# Patient Record
Sex: Male | Born: 1940 | Race: White | Hispanic: No | Marital: Married | Smoking: Never smoker
Health system: Southern US, Community
[De-identification: ages and names within clinical notes are randomized; demographics above are authoritative.]

## PROBLEM LIST (undated history)

## (undated) DIAGNOSIS — I495 Sick sinus syndrome: Secondary | ICD-10-CM

## (undated) DIAGNOSIS — I351 Nonrheumatic aortic (valve) insufficiency: Secondary | ICD-10-CM

## (undated) DIAGNOSIS — I5189 Other ill-defined heart diseases: Secondary | ICD-10-CM

## (undated) DIAGNOSIS — F419 Anxiety disorder, unspecified: Secondary | ICD-10-CM

## (undated) DIAGNOSIS — M25519 Pain in unspecified shoulder: Secondary | ICD-10-CM

## (undated) DIAGNOSIS — M79605 Pain in left leg: Secondary | ICD-10-CM

## (undated) DIAGNOSIS — T4145XA Adverse effect of unspecified anesthetic, initial encounter: Secondary | ICD-10-CM

## (undated) DIAGNOSIS — D509 Iron deficiency anemia, unspecified: Secondary | ICD-10-CM

## (undated) DIAGNOSIS — F0281 Dementia in other diseases classified elsewhere with behavioral disturbance: Secondary | ICD-10-CM

## (undated) DIAGNOSIS — F329 Major depressive disorder, single episode, unspecified: Secondary | ICD-10-CM

## (undated) DIAGNOSIS — F32A Depression, unspecified: Secondary | ICD-10-CM

## (undated) DIAGNOSIS — F02818 Dementia in other diseases classified elsewhere, unspecified severity, with other behavioral disturbance: Secondary | ICD-10-CM

## (undated) DIAGNOSIS — T8859XA Other complications of anesthesia, initial encounter: Secondary | ICD-10-CM

## (undated) DIAGNOSIS — I309 Acute pericarditis, unspecified: Secondary | ICD-10-CM

## (undated) DIAGNOSIS — Z950 Presence of cardiac pacemaker: Secondary | ICD-10-CM

## (undated) DIAGNOSIS — I10 Essential (primary) hypertension: Secondary | ICD-10-CM

## (undated) DIAGNOSIS — G3183 Dementia with Lewy bodies: Secondary | ICD-10-CM

## (undated) HISTORY — DX: Anxiety disorder, unspecified: F41.9

## (undated) HISTORY — DX: Neurocognitive disorder with Lewy bodies: G31.83

## (undated) HISTORY — DX: Major depressive disorder, single episode, unspecified: F32.9

## (undated) HISTORY — PX: OTHER SURGICAL HISTORY: SHX169

## (undated) HISTORY — DX: Nonrheumatic aortic (valve) insufficiency: I35.1

## (undated) HISTORY — PX: NM MYOVIEW LTD: HXRAD82

## (undated) HISTORY — DX: Pain in left leg: M79.605

## (undated) HISTORY — DX: Depression, unspecified: F32.A

## (undated) HISTORY — PX: US ECHOCARDIOGRAPHY: HXRAD669

## (undated) HISTORY — DX: Dementia in other diseases classified elsewhere with behavioral disturbance: F02.81

## (undated) HISTORY — DX: Iron deficiency anemia, unspecified: D50.9

## (undated) HISTORY — DX: Dementia in other diseases classified elsewhere, unspecified severity, with other behavioral disturbance: F02.818

## (undated) HISTORY — DX: Pain in unspecified shoulder: M25.519

## (undated) HISTORY — DX: Other ill-defined heart diseases: I51.89

## (undated) HISTORY — PX: HERNIA REPAIR: SHX51

## (undated) HISTORY — PX: EYE SURGERY: SHX253

---

## 2004-04-10 ENCOUNTER — Ambulatory Visit (HOSPITAL_COMMUNITY): Admission: RE | Admit: 2004-04-10 | Discharge: 2004-04-10 | Payer: Self-pay | Admitting: *Deleted

## 2009-01-06 ENCOUNTER — Ambulatory Visit (HOSPITAL_COMMUNITY): Admission: RE | Admit: 2009-01-06 | Discharge: 2009-01-06 | Payer: Self-pay | Admitting: *Deleted

## 2011-02-12 NOTE — Op Note (Signed)
Charles [name], Charles [name]              ACCOUNT NO.:  [id number]   MEDICAL RECORD NO.:  [id number]          PATIENT TYPE:  AMB   LOCATION:  ENDO                         FACILITY:  [facility]   PHYSICIAN:  Georgiana Spinner, M.D.    DATE OF BIRTH:  [birth year]   DATE OF PROCEDURE:  01/06/2009  DATE OF DISCHARGE:                               OPERATIVE REPORT   PROCEDURE:  Colonoscopy.   INDICATIONS:  Colon cancer surveillance.  He has a brother who died of  colon cancer.   ANESTHESIA:  Fentanyl 70 mcg, Versed 7 mg.   PROCEDURE:  With the patient mildly sedated in the left lateral  decubitus position, a rectal exam was performed which was normal to my  limited examination.  Subsequently, the Pentax videoscopic pediatric  colonoscope was inserted in the rectum and passed under direct vision to  the cecum, identified by ileocecal valve and appendiceal orifice, both  of which were photographed.  From this point, the colonoscope was slowly  withdrawn, taking circumferential views of colonic mucosa, stopping in  the rectum which appeared normal on direct and showed hemorrhoids on  retroflexed view.  The endoscope was straightened and withdrawn.  The  patient's vital signs and pulse oximeter remained stable.  The patient  tolerated the procedure well without apparent complications.   FINDINGS:  Internal hemorrhoids.  Otherwise unremarkable examination.   PLAN:  Repeat examination in 5 years.           ______________________________  Georgiana Spinner, M.D.     GMO/MEDQ  D:  01/06/2009  T:  01/06/2009  Job:  [id number]

## 2011-02-15 NOTE — Op Note (Signed)
NAME:  Charles [name], Charles [name]                        ACCOUNT NO.:  [id number]   MEDICAL RECORD NO.:  [id number]                   PATIENT TYPE:  AMB   LOCATION:  ENDO                                 FACILITY:  [facility]   PHYSICIAN:  Georgiana Spinner, M.D.                 DATE OF BIRTH:  1941/05/04   DATE OF PROCEDURE:  04/10/2004  DATE OF DISCHARGE:                                 OPERATIVE REPORT   PROCEDURE:  Colonoscopy.   INDICATIONS:  Hemocult positivity, colon cancer screening.   ANESTHESIA:  Demerol 100 mg, Versed 5 mg.   DESCRIPTION OF PROCEDURE:  With the patient mildly sedated in the left  lateral decubitus position, a rectal examination was performed.  This  revealed trace positive material.  Subsequently the Olympus videoscopic  colonoscope was inserted into the rectum  and passed under direct vision to  the cecum -- identified by the ileocecal valve and appendiceal orifice, both  of which are photographed.  From this point the colonoscope was slowly  withdrawn, taking circumferential views of the remaining colonic mucosa and  stopping in the rectum (which appeared normal in direct and showed  hemorrhoids in retroflex view).  The endoscope was straightened and  withdrawn.  The patient's vital signs and pulse oximetry remained stable.  The patient tolerated the procedure well and all without apparent  complications.   FINDINGS:  Internal hemorrhoids, otherwise unremarkable examination.   PLAN:  Repeat procedure in approximately five years.                                               Georgiana Spinner, M.D.    GMO/MEDQ  D:  04/10/2004  T:  04/10/2004  Job:  [id number]

## 2011-12-11 DIAGNOSIS — I10 Essential (primary) hypertension: Secondary | ICD-10-CM | POA: Diagnosis not present

## 2011-12-11 DIAGNOSIS — I479 Paroxysmal tachycardia, unspecified: Secondary | ICD-10-CM | POA: Diagnosis not present

## 2011-12-13 DIAGNOSIS — R002 Palpitations: Secondary | ICD-10-CM | POA: Diagnosis not present

## 2011-12-13 DIAGNOSIS — I10 Essential (primary) hypertension: Secondary | ICD-10-CM | POA: Diagnosis not present

## 2011-12-17 DIAGNOSIS — E782 Mixed hyperlipidemia: Secondary | ICD-10-CM | POA: Diagnosis not present

## 2011-12-17 DIAGNOSIS — R5381 Other malaise: Secondary | ICD-10-CM | POA: Diagnosis not present

## 2011-12-17 DIAGNOSIS — R002 Palpitations: Secondary | ICD-10-CM | POA: Diagnosis not present

## 2011-12-17 DIAGNOSIS — Z79899 Other long term (current) drug therapy: Secondary | ICD-10-CM | POA: Diagnosis not present

## 2011-12-19 ENCOUNTER — Encounter (HOSPITAL_COMMUNITY): Payer: Self-pay | Admitting: *Deleted

## 2011-12-19 ENCOUNTER — Emergency Department (HOSPITAL_COMMUNITY)
Admission: EM | Admit: 2011-12-19 | Discharge: 2011-12-20 | Disposition: A | Discharge status: Home / Self Care | Payer: Medicare Other | Attending: Emergency Medicine | Admitting: Emergency Medicine

## 2011-12-19 ENCOUNTER — Emergency Department (HOSPITAL_COMMUNITY): Payer: Medicare Other

## 2011-12-19 ENCOUNTER — Other Ambulatory Visit: Payer: Self-pay

## 2011-12-19 DIAGNOSIS — R002 Palpitations: Secondary | ICD-10-CM | POA: Diagnosis not present

## 2011-12-19 DIAGNOSIS — J438 Other emphysema: Secondary | ICD-10-CM | POA: Diagnosis not present

## 2011-12-19 DIAGNOSIS — Z7982 Long term (current) use of aspirin: Secondary | ICD-10-CM | POA: Insufficient documentation

## 2011-12-19 DIAGNOSIS — F411 Generalized anxiety disorder: Secondary | ICD-10-CM | POA: Diagnosis not present

## 2011-12-19 DIAGNOSIS — I10 Essential (primary) hypertension: Secondary | ICD-10-CM | POA: Diagnosis not present

## 2011-12-19 DIAGNOSIS — R918 Other nonspecific abnormal finding of lung field: Secondary | ICD-10-CM | POA: Diagnosis not present

## 2011-12-19 DIAGNOSIS — R000 Tachycardia, unspecified: Secondary | ICD-10-CM

## 2011-12-19 HISTORY — DX: Essential (primary) hypertension: I10

## 2011-12-19 LAB — POCT I-STAT, CHEM 8
Chloride: 95 mEq/L — ABNORMAL LOW (ref 96–112)
HCT: 53 % — ABNORMAL HIGH (ref 39.0–52.0)
Hemoglobin: 18 g/dL — ABNORMAL HIGH (ref 13.0–17.0)
Potassium: 4 mEq/L (ref 3.5–5.1)
Sodium: 134 mEq/L — ABNORMAL LOW (ref 135–145)

## 2011-12-19 LAB — CARDIAC PANEL(CRET KIN+CKTOT+MB+TROPI)
Relative Index: INVALID (ref 0.0–2.5)
Troponin I: <0.3 ng/mL (ref <0.30)

## 2011-12-19 NOTE — ED Notes (Signed)
I gave the patient two containers of apple juice. 

## 2011-12-19 NOTE — ED Notes (Signed)
Pt reports x1 week of intermittent episodes of palpitations, pt states he relates these episodes to when he has "panic attacks" pt denies any chest pain, shortness of breath, n/v, dizziness or lightheadedness during episodes of palpitations - pt has seen his PCP and cardiologist for this. Pt in acute distress at present, placed on cardiac monitor - NSR at a rate of 68bpm. Pt denies pain at present.

## 2011-12-19 NOTE — ED Provider Notes (Signed)
History     CSN: [id number]  Arrival date & time 12/19/11  1726   First MD Initiated Contact with Patient 12/19/11 2318      Chief Complaint  Patient presents with  . Palpitations    (Consider location/radiation/quality/duration/timing/severity/associated sxs/prior treatment) Patient is a 71 y.o. male presenting with palpitations. The history is provided by the patient. No language interpreter was used.  Palpitations  Associated symptoms include irregular heartbeat. Pertinent negatives include no fever, no chest pain, no chest pressure, no near-syncope, no syncope, no abdominal pain, no nausea, no vomiting, no headaches, no back pain, no dizziness, no weakness, no cough and no shortness of breath.  Here with c/o heart racing yest and today.  Dr. Selena Batten sent patient to [facility] cardiology on  Last Friday to be evaluated.  Patient has had 3-4 episodes of heart palpatations since.  Today the palpatations returned   X 1 today at 4pm x 10 minutes. No SOB or cp.  She is an avid exerciser he does many different exercises including running riding a bike and aerobics. States that he has done this all his life he is doing nothing new nothing different. States that since the palpitations he has become more anxious. States that he's noticed that his legs will shake and he cannot stop shaking.  Started last Wednesday 3/13 when he was exercising.    [place].   Past Medical History  Diagnosis Date  . Hypertension     Past Surgical History  Procedure Date  . Hernia repair     No family history on file.  History  Substance Use Topics  . Smoking status: Not on file  . Smokeless tobacco: Not on file  . Alcohol Use: No      Review of Systems  Constitutional: Negative for fever and chills.  Respiratory: Negative for cough and shortness of breath.   Cardiovascular: Positive for palpitations. Negative for chest pain, syncope and near-syncope.  Gastrointestinal: Negative for  nausea, vomiting, abdominal pain, diarrhea and constipation.  Musculoskeletal: Negative for back pain.  Neurological: Negative for dizziness, syncope, weakness and headaches.    Allergies  Review of patient's allergies indicates no known allergies.  Home Medications   Current Outpatient Rx  Name Route Sig Dispense Refill  . ASPIRIN EC 81 MG PO TBEC Oral Take 81 mg by mouth daily.    Marland Kitchen DIPHENHYDRAMINE HCL (SLEEP) 25 MG PO TABS Oral Take 25 mg by mouth at bedtime as needed. For relaxing    . HYDROCHLOROTHIAZIDE 25 MG PO TABS Oral Take 25 mg by mouth daily.    . QUINAPRIL HCL 40 MG PO TABS Oral Take 40 mg by mouth 2 (two) times daily.      BP 139/66  Pulse 56  Temp(Src) 98.3 F (36.8 C) (Oral)  Resp 16  SpO2 97%  Physical Exam  Nursing note and vitals reviewed. Constitutional: He is oriented to person, place, and time. He appears well-developed and well-nourished.  HENT:  Head: Normocephalic.  Eyes: Conjunctivae and EOM are normal. Pupils are equal, round, and reactive to light.  Neck: Normal range of motion. Neck supple.  Cardiovascular: Normal rate and regular rhythm.   Pulmonary/Chest: Effort normal.  Abdominal: Soft.  Musculoskeletal: Normal range of motion.  Neurological: He is alert and oriented to person, place, and time.  Skin: Skin is warm and dry.  Psychiatric: He has a normal mood and affect.    ED Course  Procedures (including critical care time)  Labs Reviewed  POCT I-STAT, CHEM 8 - Abnormal; Notable for the following:    Sodium 134 (*)    Chloride 95 (*)    Hemoglobin 18.0 (*)    HCT 53.0 (*)    All other components within normal limits  CARDIAC PANEL(CRET KIN+CKTOT+MB+TROPI)   Dg Chest 2 View  12/19/2011  *RADIOLOGY REPORT*  Clinical Data: Palpitations and weakness.  CHEST - 2 VIEW  Comparison: 06/21/2010.  Findings: The heart size is normal.  Mild emphysematous changes are noted.  No focal airspace disease is present.  There is no edema or effusion  to suggest heart failure.  Degenerative changes in the thoracic spine are stable.  IMPRESSION:  1.  Mild emphysema. 2.  No acute cardiopulmonary disease.  Original Report Authenticated By: Jamesetta Orleans. MATTERN, M.D.     No diagnosis found.    MDM  Palpatations with no SOB or chest pain x 4 since he saw [facility] with [facility]. Dr Rennis Golden on call will have him follow up in the am and possibly wear a halter monitor.  Plan discussed with patient and his wife. Patient is ray for discharge. He will take Benadryl to help him sleep as directed.        Jethro Bastos, NP 12/20/11 [phone]

## 2011-12-19 NOTE — ED Notes (Signed)
Pt in triage waiting.  No distress noted.  Pt resting with family.  Denies needs.

## 2011-12-19 NOTE — ED Notes (Signed)
Called x1. No answer.

## 2011-12-19 NOTE — ED Notes (Signed)
Pt has been having episodes of "heart racing" and one episode was today.  No CP or sob with this.  Pt is in no distress at this time.  Pt has also been having high BP.  Yesterday he had heart racing and his wife checked his HR and it was 129

## 2011-12-20 DIAGNOSIS — R002 Palpitations: Secondary | ICD-10-CM | POA: Diagnosis not present

## 2011-12-20 NOTE — ED Notes (Signed)
D/c instructions reviewed w/ pt and family - pt and family deny any further questions or concerns at present.\ 

## 2011-12-20 NOTE — Discharge Instructions (Signed)
Mr Charles [name] the heart rate in the ER today was anywhere from 71s to 41s. Spoke with the cardiologist on-call for [facility] Dr. Rennis Golden he recommended that you call in the morning and get an appointment to be  seen. He suggested you  should possibly wear a Holter monitor to see if we can catch this arrhythmia. If you have any shortness of breath or chest pain with or without the palpitations call 911 and return to the ER.  Cardiac Arrhythmia Your heart is a muscle that works to pump blood through your body by regular contractions. The beating of your heart is controlled by a system of special pacemaker cells. These cells control the electrical activity of the heart. When the system controlling this regular beating is disturbed, a heart rhythm abnormality (arrhythmia) results. WHEN YOUR HEART SKIPS A BEAT One of the most common and least serious heart arrhythmias is called an ectopic or premature atrial heartbeat (PAC). This may be noticed as a small change in your regular pulse. A PAC originates from the top part (atrium) of the heart. Within the right atrium, the SA node is the area that normally controls the regularity of the heart. PACs occur in heart tissue outside of the SA node region. You may feel this as a skipped beat or heart flutter, especially if several occur in succession or occur frequently.  Another arrhythmia is ventricular premature complex (VCP or PVC). These extra beats start out in the bottom, more muscular chambers of the heart. In most cases a PVC is harmless. If there are underlying causes that are making the heart irritable such as an overactive thyroid or a prior heart attack PVCs may be of more concern. In a few cases, medications to control the heart rhythm may be prescribed. Things to try at home:  Cut down or avoid alcohol, tobacco and caffeine.   Get enough sleep.   Reduce stress.   Exercise more.  WHEN THE HEART BEATS TOO FAST Atrial tachycardia is a fast heart  rate, which starts out in the atrium. It may last from minutes to much longer. Your heart may beat 140 to 240 times per minute instead of the normal 60 to 100.  Symptoms include a worried feeling (anxiety) and a sense that your heart is beating fast and hard.   You may be able to stop the fast rate by holding your breath or bearing down as if you were going to have a bowel movement.   This type of fast rate is usually not dangerous.  Atrial fibrillation and atrial flutter are other fast rhythms that start in the atria. Both conditions keep the atria from filling with enough blood so the heart does not work well.  Symptoms include feeling light-headed or faint.   These fast rates may be the result of heart damage or disease. Too much thyroid hormone may play a role.   There may be no clear cause or it may be from heart disease or damage.   Medication or a special electrical treatment (cardioversion) may be needed to get the heart beating normally.  Ventricular tachycardia is a fast heart rate that starts in the lower muscular chambers (ventricles) This is a serious disorder that requires treatment as soon as possible. You need someone else to get and use a small defibrillator.  Symptoms include collapse, chest pain, or being short of breath.   Treatment may include medication, procedures to improve blood flow to the heart, or an implantable cardiac  defibrillator (ICD).  DIAGNOSIS   A cardiogram (EKG or ECG) will be done to see the arrhythmia, as well as lab tests to check the underlying cause.   If the extra beats or fast rate come and go, you may wear a Holter monitor that records your heart rate for a longer period of time.  SEEK MEDICAL CARE IF:  You have irregular or fast heartbeats (palpitations).   You experience skipped beats.   You develop lightheadedness.   You have chest discomfort.   You have shortness of breath.   You have more frequent episodes, if you are already  being treated.  SEEK IMMEDIATE MEDICAL CARE IF:   You have severe chest pain, especially if the pain is crushing or pressure-like and spreads to the arms, back, neck, or jaw, or if you have sweating, feeling sick to your stomach (nausea), or shortness of breath. THIS IS AN EMERGENCY. Do not wait to see if the pain will go away. Get medical help at once. Call 911 or 0 (operator). DO NOT drive yourself to the hospital.   You feel dizzy or faint.   You have episodes of previously documented atrial tachycardia that do not resolve with the techniques your caregiver has taught you.   Irregular or rapid heartbeats begin to occur more often than in the past, especially if they are associated with more pronounced symptoms or of longer duration.  Document Released: 09/16/2005 Document Revised: 09/05/2011 Document Reviewed: 05/04/2008 [facility] Patient Information 2012 [place], [place].Cardiac Arrhythmia Your heart is a muscle that works to pump blood through your body by regular contractions. The beating of your heart is controlled by a system of special pacemaker cells. These cells control the electrical activity of the heart. When the system controlling this regular beating is disturbed, a heart rhythm abnormality (arrhythmia) results. WHEN YOUR HEART SKIPS A BEAT One of the most common and least serious heart arrhythmias is called an ectopic or premature atrial heartbeat (PAC). This may be noticed as a small change in your regular pulse. A PAC originates from the top part (atrium) of the heart. Within the right atrium, the SA node is the area that normally controls the regularity of the heart. PACs occur in heart tissue outside of the SA node region. You may feel this as a skipped beat or heart flutter, especially if several occur in succession or occur frequently.  Another arrhythmia is ventricular premature complex (VCP or PVC). These extra beats start out in the bottom, more muscular chambers of the heart.  In most cases a PVC is harmless. If there are underlying causes that are making the heart irritable such as an overactive thyroid or a prior heart attack PVCs may be of more concern. In a few cases, medications to control the heart rhythm may be prescribed. Things to try at home:  Cut down or avoid alcohol, tobacco and caffeine.   Get enough sleep.   Reduce stress.   Exercise more.  WHEN THE HEART BEATS TOO FAST Atrial tachycardia is a fast heart rate, which starts out in the atrium. It may last from minutes to much longer. Your heart may beat 140 to 240 times per minute instead of the normal 60 to 100.  Symptoms include a worried feeling (anxiety) and a sense that your heart is beating fast and hard.   You may be able to stop the fast rate by holding your breath or bearing down as if you were going to have a bowel movement.  This type of fast rate is usually not dangerous.  Atrial fibrillation and atrial flutter are other fast rhythms that start in the atria. Both conditions keep the atria from filling with enough blood so the heart does not work well.  Symptoms include feeling light-headed or faint.   These fast rates may be the result of heart damage or disease. Too much thyroid hormone may play a role.   There may be no clear cause or it may be from heart disease or damage.   Medication or a special electrical treatment (cardioversion) may be needed to get the heart beating normally.  Ventricular tachycardia is a fast heart rate that starts in the lower muscular chambers (ventricles) This is a serious disorder that requires treatment as soon as possible. You need someone else to get and use a small defibrillator.  Symptoms include collapse, chest pain, or being short of breath.   Treatment may include medication, procedures to improve blood flow to the heart, or an implantable cardiac defibrillator (ICD).  DIAGNOSIS   A cardiogram (EKG or ECG) will be done to see the  arrhythmia, as well as lab tests to check the underlying cause.   If the extra beats or fast rate come and go, you may wear a Holter monitor that records your heart rate for a longer period of time.  SEEK MEDICAL CARE IF:  You have irregular or fast heartbeats (palpitations).   You experience skipped beats.   You develop lightheadedness.   You have chest discomfort.   You have shortness of breath.   You have more frequent episodes, if you are already being treated.  SEEK IMMEDIATE MEDICAL CARE IF:   You have severe chest pain, especially if the pain is crushing or pressure-like and spreads to the arms, back, neck, or jaw, or if you have sweating, feeling sick to your stomach (nausea), or shortness of breath. THIS IS AN EMERGENCY. Do not wait to see if the pain will go away. Get medical help at once. Call 911 or 0 (operator). DO NOT drive yourself to the hospital.   You feel dizzy or faint.   You have episodes of previously documented atrial tachycardia that do not resolve with the techniques your caregiver has taught you.   Irregular or rapid heartbeats begin to occur more often than in the past, especially if they are associated with more pronounced symptoms or of longer duration.  Document Released: 09/16/2005 Document Revised: 09/05/2011 Document Reviewed: 05/04/2008 [facility] Patient Information 2012 [place], [place].Cardiac Arrhythmia Your heart is a muscle that works to pump blood through your body by regular contractions. The beating of your heart is controlled by a system of special pacemaker cells. These cells control the electrical activity of the heart. When the system controlling this regular beating is disturbed, a heart rhythm abnormality (arrhythmia) results. WHEN YOUR HEART SKIPS A BEAT One of the most common and least serious heart arrhythmias is called an ectopic or premature atrial heartbeat (PAC). This may be noticed as a small change in your regular pulse. A PAC  originates from the top part (atrium) of the heart. Within the right atrium, the SA node is the area that normally controls the regularity of the heart. PACs occur in heart tissue outside of the SA node region. You may feel this as a skipped beat or heart flutter, especially if several occur in succession or occur frequently.  Another arrhythmia is ventricular premature complex (VCP or PVC). These extra beats start out in  the bottom, more muscular chambers of the heart. In most cases a PVC is harmless. If there are underlying causes that are making the heart irritable such as an overactive thyroid or a prior heart attack PVCs may be of more concern. In a few cases, medications to control the heart rhythm may be prescribed. Things to try at home:  Cut down or avoid alcohol, tobacco and caffeine.   Get enough sleep.   Reduce stress.   Exercise more.  WHEN THE HEART BEATS TOO FAST Atrial tachycardia is a fast heart rate, which starts out in the atrium. It may last from minutes to much longer. Your heart may beat 140 to 240 times per minute instead of the normal 60 to 100.  Symptoms include a worried feeling (anxiety) and a sense that your heart is beating fast and hard.   You may be able to stop the fast rate by holding your breath or bearing down as if you were going to have a bowel movement.   This type of fast rate is usually not dangerous.  Atrial fibrillation and atrial flutter are other fast rhythms that start in the atria. Both conditions keep the atria from filling with enough blood so the heart does not work well.  Symptoms include feeling light-headed or faint.   These fast rates may be the result of heart damage or disease. Too much thyroid hormone may play a role.   There may be no clear cause or it may be from heart disease or damage.   Medication or a special electrical treatment (cardioversion) may be needed to get the heart beating normally.  Ventricular tachycardia is a fast  heart rate that starts in the lower muscular chambers (ventricles) This is a serious disorder that requires treatment as soon as possible. You need someone else to get and use a small defibrillator.  Symptoms include collapse, chest pain, or being short of breath.   Treatment may include medication, procedures to improve blood flow to the heart, or an implantable cardiac defibrillator (ICD).  DIAGNOSIS   A cardiogram (EKG or ECG) will be done to see the arrhythmia, as well as lab tests to check the underlying cause.   If the extra beats or fast rate come and go, you may wear a Holter monitor that records your heart rate for a longer period of time.  SEEK MEDICAL CARE IF:  You have irregular or fast heartbeats (palpitations).   You experience skipped beats.   You develop lightheadedness.   You have chest discomfort.   You have shortness of breath.   You have more frequent episodes, if you are already being treated.  SEEK IMMEDIATE MEDICAL CARE IF:   You have severe chest pain, especially if the pain is crushing or pressure-like and spreads to the arms, back, neck, or jaw, or if you have sweating, feeling sick to your stomach (nausea), or shortness of breath. THIS IS AN EMERGENCY. Do not wait to see if the pain will go away. Get medical help at once. Call 911 or 0 (operator). DO NOT drive yourself to the hospital.   You feel dizzy or faint.   You have episodes of previously documented atrial tachycardia that do not resolve with the techniques your caregiver has taught you.   Irregular or rapid heartbeats begin to occur more often than in the past, especially if they are associated with more pronounced symptoms or of longer duration.  Document Released: 09/16/2005 Document Revised: 09/05/2011 Document Reviewed: 05/04/2008  [employer] Patient Information 2012 [place], [place].Tachycardia, Nonspecific In adults, the heart normally beats between 60 and 100 times a minute. A heart rate over  100 is called tachycardia. When your heart beats too fast, it may not be able to pump enough blood to the rest of the body. CAUSES   Exercise or exertion.   Fever.   Pain or injury.   Infection.   Loss of fluid (dehydration).   Overactive thyroid.   Lack of red blood cells (anemia).   Anxiety.   Alcohol.   Heart arrhythmia.   Caffeine.   Tobacco products.   Diet pills.   Street drugs.   Heart disease.  SYMPTOMS  Palpitations (rapid or irregular heartbeat).   Dizziness.   Tiredness (fatigue).   Shortness of breath.  DIAGNOSIS  After an exam and taking a history, your caregiver may order:  Blood tests.   Electrocardiogram (EKG).   Heart monitor.  TREATMENT  Treatment will depend on the cause and potential for harm. It may include:  Intravenous (IV) replacement of fluids or blood.   Antidote or reversal medicines.   Changes in your present medicines.   Lifestyle changes.  HOME CARE INSTRUCTIONS   Get rest.   Drink enough water and fluids to keep your urine clear or pale yellow.   Avoid:   Caffeine.   Nicotine.   Alcohol.   Stress.   Chocolate.   Stimulants.   Only take medicine as directed by your caregiver.  SEEK IMMEDIATE MEDICAL CARE IF:   You have pain in your chest, upper arms, jaw, or neck.   You become weak, dizzy, or feel faint.   You have palpitations that will not go away.   You throw up (vomit), have diarrhea, or pass blood.   You look pale and your skin is cool and wet.  MAKE SURE YOU:   Understand these instructions.   Will watch your condition.   Will get help right away if you are not doing well or get worse.  Document Released: 10/24/2004 Document Revised: 09/05/2011 Document Reviewed: 08/27/2011 [facility] Patient Information 2012 [place], [place].

## 2012-01-01 DIAGNOSIS — Z8249 Family history of ischemic heart disease and other diseases of the circulatory system: Secondary | ICD-10-CM | POA: Diagnosis not present

## 2012-01-01 DIAGNOSIS — R002 Palpitations: Secondary | ICD-10-CM | POA: Diagnosis not present

## 2012-01-01 DIAGNOSIS — I359 Nonrheumatic aortic valve disorder, unspecified: Secondary | ICD-10-CM | POA: Diagnosis not present

## 2012-01-01 DIAGNOSIS — I10 Essential (primary) hypertension: Secondary | ICD-10-CM | POA: Diagnosis not present

## 2012-01-05 NOTE — ED Provider Notes (Signed)
Evaluation and management procedures were performed by the PA/NP/Resident Physician under my supervision/collaboration.   Charles Gene D Chemeka Filice, MD 01/05/12 1547 

## 2012-02-13 DIAGNOSIS — R002 Palpitations: Secondary | ICD-10-CM | POA: Diagnosis not present

## 2012-03-25 DIAGNOSIS — I10 Essential (primary) hypertension: Secondary | ICD-10-CM | POA: Diagnosis not present

## 2012-03-25 DIAGNOSIS — L570 Actinic keratosis: Secondary | ICD-10-CM | POA: Diagnosis not present

## 2012-03-25 DIAGNOSIS — Z0000 Encounter for general adult medical examination without abnormal findings: Secondary | ICD-10-CM | POA: Diagnosis not present

## 2012-06-26 DIAGNOSIS — Z23 Encounter for immunization: Secondary | ICD-10-CM | POA: Diagnosis not present

## 2012-09-21 DIAGNOSIS — Z125 Encounter for screening for malignant neoplasm of prostate: Secondary | ICD-10-CM | POA: Diagnosis not present

## 2012-09-21 DIAGNOSIS — Z0000 Encounter for general adult medical examination without abnormal findings: Secondary | ICD-10-CM | POA: Diagnosis not present

## 2012-09-21 DIAGNOSIS — I10 Essential (primary) hypertension: Secondary | ICD-10-CM | POA: Diagnosis not present

## 2012-09-29 DIAGNOSIS — I739 Peripheral vascular disease, unspecified: Secondary | ICD-10-CM | POA: Diagnosis not present

## 2012-09-29 DIAGNOSIS — I10 Essential (primary) hypertension: Secondary | ICD-10-CM | POA: Diagnosis not present

## 2012-09-29 DIAGNOSIS — L570 Actinic keratosis: Secondary | ICD-10-CM | POA: Diagnosis not present

## 2012-10-09 DIAGNOSIS — I10 Essential (primary) hypertension: Secondary | ICD-10-CM | POA: Diagnosis not present

## 2012-10-09 DIAGNOSIS — R002 Palpitations: Secondary | ICD-10-CM | POA: Diagnosis not present

## 2012-10-13 ENCOUNTER — Other Ambulatory Visit (HOSPITAL_COMMUNITY): Payer: Self-pay | Admitting: Cardiovascular Disease

## 2012-10-13 DIAGNOSIS — M79605 Pain in left leg: Secondary | ICD-10-CM

## 2012-10-15 ENCOUNTER — Ambulatory Visit (HOSPITAL_COMMUNITY)
Admission: RE | Admit: 2012-10-15 | Discharge: 2012-10-15 | Disposition: A | Discharge status: Home / Self Care | Payer: Medicare Other | Source: Ambulatory Visit | Attending: Cardiovascular Disease | Admitting: Cardiovascular Disease

## 2012-10-15 DIAGNOSIS — M79605 Pain in left leg: Secondary | ICD-10-CM

## 2012-10-15 DIAGNOSIS — M79609 Pain in unspecified limb: Secondary | ICD-10-CM | POA: Insufficient documentation

## 2012-10-15 DIAGNOSIS — I70219 Atherosclerosis of native arteries of extremities with intermittent claudication, unspecified extremity: Secondary | ICD-10-CM | POA: Diagnosis not present

## 2012-10-15 NOTE — Progress Notes (Signed)
BLE arterial duplex completed. Rainbow Salman D  

## 2013-03-25 DIAGNOSIS — Z125 Encounter for screening for malignant neoplasm of prostate: Secondary | ICD-10-CM | POA: Diagnosis not present

## 2013-03-25 DIAGNOSIS — I10 Essential (primary) hypertension: Secondary | ICD-10-CM | POA: Diagnosis not present

## 2013-03-25 DIAGNOSIS — L570 Actinic keratosis: Secondary | ICD-10-CM | POA: Diagnosis not present

## 2013-03-25 DIAGNOSIS — I739 Peripheral vascular disease, unspecified: Secondary | ICD-10-CM | POA: Diagnosis not present

## 2013-05-05 DIAGNOSIS — I10 Essential (primary) hypertension: Secondary | ICD-10-CM | POA: Diagnosis not present

## 2013-05-05 DIAGNOSIS — F411 Generalized anxiety disorder: Secondary | ICD-10-CM | POA: Diagnosis not present

## 2013-05-26 DIAGNOSIS — L989 Disorder of the skin and subcutaneous tissue, unspecified: Secondary | ICD-10-CM | POA: Diagnosis not present

## 2013-05-26 DIAGNOSIS — I10 Essential (primary) hypertension: Secondary | ICD-10-CM | POA: Diagnosis not present

## 2013-06-18 DIAGNOSIS — L570 Actinic keratosis: Secondary | ICD-10-CM | POA: Diagnosis not present

## 2013-06-18 DIAGNOSIS — Z23 Encounter for immunization: Secondary | ICD-10-CM | POA: Diagnosis not present

## 2013-10-04 DIAGNOSIS — I10 Essential (primary) hypertension: Secondary | ICD-10-CM | POA: Diagnosis not present

## 2013-10-04 DIAGNOSIS — Z125 Encounter for screening for malignant neoplasm of prostate: Secondary | ICD-10-CM | POA: Diagnosis not present

## 2013-10-07 DIAGNOSIS — L570 Actinic keratosis: Secondary | ICD-10-CM | POA: Diagnosis not present

## 2013-10-07 DIAGNOSIS — L821 Other seborrheic keratosis: Secondary | ICD-10-CM | POA: Diagnosis not present

## 2013-10-12 DIAGNOSIS — Z125 Encounter for screening for malignant neoplasm of prostate: Secondary | ICD-10-CM | POA: Diagnosis not present

## 2013-10-12 DIAGNOSIS — Z800 Family history of malignant neoplasm of digestive organs: Secondary | ICD-10-CM | POA: Diagnosis not present

## 2013-10-12 DIAGNOSIS — I10 Essential (primary) hypertension: Secondary | ICD-10-CM | POA: Diagnosis not present

## 2013-10-22 DIAGNOSIS — L259 Unspecified contact dermatitis, unspecified cause: Secondary | ICD-10-CM | POA: Diagnosis not present

## 2013-10-27 DIAGNOSIS — Z800 Family history of malignant neoplasm of digestive organs: Secondary | ICD-10-CM | POA: Diagnosis not present

## 2013-11-05 DIAGNOSIS — L259 Unspecified contact dermatitis, unspecified cause: Secondary | ICD-10-CM | POA: Diagnosis not present

## 2013-12-01 DIAGNOSIS — K5730 Diverticulosis of large intestine without perforation or abscess without bleeding: Secondary | ICD-10-CM | POA: Diagnosis not present

## 2013-12-01 DIAGNOSIS — Z800 Family history of malignant neoplasm of digestive organs: Secondary | ICD-10-CM | POA: Diagnosis not present

## 2013-12-01 DIAGNOSIS — D126 Benign neoplasm of colon, unspecified: Secondary | ICD-10-CM | POA: Diagnosis not present

## 2013-12-24 DIAGNOSIS — L570 Actinic keratosis: Secondary | ICD-10-CM | POA: Diagnosis not present

## 2014-07-14 DIAGNOSIS — Z23 Encounter for immunization: Secondary | ICD-10-CM | POA: Diagnosis not present

## 2014-08-19 DIAGNOSIS — T498X5A Adverse effect of other topical agents, initial encounter: Secondary | ICD-10-CM | POA: Diagnosis not present

## 2014-08-19 DIAGNOSIS — L233 Allergic contact dermatitis due to drugs in contact with skin: Secondary | ICD-10-CM | POA: Diagnosis not present

## 2014-10-07 DIAGNOSIS — I10 Essential (primary) hypertension: Secondary | ICD-10-CM | POA: Diagnosis not present

## 2014-10-17 DIAGNOSIS — I10 Essential (primary) hypertension: Secondary | ICD-10-CM | POA: Diagnosis not present

## 2014-10-17 DIAGNOSIS — Z125 Encounter for screening for malignant neoplasm of prostate: Secondary | ICD-10-CM | POA: Diagnosis not present

## 2014-10-24 DIAGNOSIS — I10 Essential (primary) hypertension: Secondary | ICD-10-CM | POA: Diagnosis not present

## 2014-10-24 DIAGNOSIS — Z0000 Encounter for general adult medical examination without abnormal findings: Secondary | ICD-10-CM | POA: Diagnosis not present

## 2014-10-24 DIAGNOSIS — R0989 Other specified symptoms and signs involving the circulatory and respiratory systems: Secondary | ICD-10-CM | POA: Diagnosis not present

## 2014-11-18 DIAGNOSIS — L814 Other melanin hyperpigmentation: Secondary | ICD-10-CM | POA: Diagnosis not present

## 2014-11-18 DIAGNOSIS — L821 Other seborrheic keratosis: Secondary | ICD-10-CM | POA: Diagnosis not present

## 2014-12-22 DIAGNOSIS — I10 Essential (primary) hypertension: Secondary | ICD-10-CM | POA: Diagnosis not present

## 2015-04-24 DIAGNOSIS — I10 Essential (primary) hypertension: Secondary | ICD-10-CM | POA: Diagnosis not present

## 2015-04-26 ENCOUNTER — Encounter: Payer: Self-pay | Admitting: *Deleted

## 2015-04-27 DIAGNOSIS — I10 Essential (primary) hypertension: Secondary | ICD-10-CM | POA: Diagnosis not present

## 2015-04-27 DIAGNOSIS — Z125 Encounter for screening for malignant neoplasm of prostate: Secondary | ICD-10-CM | POA: Diagnosis not present

## 2015-04-27 DIAGNOSIS — H00019 Hordeolum externum unspecified eye, unspecified eyelid: Secondary | ICD-10-CM | POA: Diagnosis not present

## 2015-05-29 ENCOUNTER — Encounter: Payer: Self-pay | Admitting: Cardiovascular Disease

## 2015-06-14 DIAGNOSIS — Z23 Encounter for immunization: Secondary | ICD-10-CM | POA: Diagnosis not present

## 2015-08-03 DIAGNOSIS — I10 Essential (primary) hypertension: Secondary | ICD-10-CM | POA: Diagnosis not present

## 2015-08-16 DIAGNOSIS — H52223 Regular astigmatism, bilateral: Secondary | ICD-10-CM | POA: Diagnosis not present

## 2015-08-16 DIAGNOSIS — H5202 Hypermetropia, left eye: Secondary | ICD-10-CM | POA: Diagnosis not present

## 2015-08-16 DIAGNOSIS — H5211 Myopia, right eye: Secondary | ICD-10-CM | POA: Diagnosis not present

## 2015-08-16 DIAGNOSIS — H2513 Age-related nuclear cataract, bilateral: Secondary | ICD-10-CM | POA: Diagnosis not present

## 2015-09-29 DIAGNOSIS — H18412 Arcus senilis, left eye: Secondary | ICD-10-CM | POA: Diagnosis not present

## 2015-09-29 DIAGNOSIS — H02839 Dermatochalasis of unspecified eye, unspecified eyelid: Secondary | ICD-10-CM | POA: Diagnosis not present

## 2015-09-29 DIAGNOSIS — H2511 Age-related nuclear cataract, right eye: Secondary | ICD-10-CM | POA: Diagnosis not present

## 2015-09-29 DIAGNOSIS — H18411 Arcus senilis, right eye: Secondary | ICD-10-CM | POA: Diagnosis not present

## 2015-10-10 DIAGNOSIS — J029 Acute pharyngitis, unspecified: Secondary | ICD-10-CM | POA: Diagnosis not present

## 2015-10-26 DIAGNOSIS — Z125 Encounter for screening for malignant neoplasm of prostate: Secondary | ICD-10-CM | POA: Diagnosis not present

## 2015-10-26 DIAGNOSIS — I10 Essential (primary) hypertension: Secondary | ICD-10-CM | POA: Diagnosis not present

## 2015-11-01 DIAGNOSIS — F419 Anxiety disorder, unspecified: Secondary | ICD-10-CM | POA: Diagnosis not present

## 2015-11-01 DIAGNOSIS — I10 Essential (primary) hypertension: Secondary | ICD-10-CM | POA: Diagnosis not present

## 2015-11-01 DIAGNOSIS — Z125 Encounter for screening for malignant neoplasm of prostate: Secondary | ICD-10-CM | POA: Diagnosis not present

## 2015-11-17 DIAGNOSIS — L812 Freckles: Secondary | ICD-10-CM | POA: Diagnosis not present

## 2015-11-17 DIAGNOSIS — L570 Actinic keratosis: Secondary | ICD-10-CM | POA: Diagnosis not present

## 2015-11-17 DIAGNOSIS — D485 Neoplasm of uncertain behavior of skin: Secondary | ICD-10-CM | POA: Diagnosis not present

## 2015-11-17 DIAGNOSIS — D225 Melanocytic nevi of trunk: Secondary | ICD-10-CM | POA: Diagnosis not present

## 2015-11-17 DIAGNOSIS — L821 Other seborrheic keratosis: Secondary | ICD-10-CM | POA: Diagnosis not present

## 2015-11-20 DIAGNOSIS — H16141 Punctate keratitis, right eye: Secondary | ICD-10-CM | POA: Diagnosis not present

## 2015-11-20 DIAGNOSIS — H25811 Combined forms of age-related cataract, right eye: Secondary | ICD-10-CM | POA: Diagnosis not present

## 2015-11-20 DIAGNOSIS — Z9849 Cataract extraction status, unspecified eye: Secondary | ICD-10-CM | POA: Diagnosis not present

## 2015-11-20 DIAGNOSIS — H52221 Regular astigmatism, right eye: Secondary | ICD-10-CM | POA: Diagnosis not present

## 2015-11-20 DIAGNOSIS — H2511 Age-related nuclear cataract, right eye: Secondary | ICD-10-CM | POA: Diagnosis not present

## 2015-11-20 DIAGNOSIS — H5201 Hypermetropia, right eye: Secondary | ICD-10-CM | POA: Diagnosis not present

## 2015-11-20 DIAGNOSIS — Z961 Presence of intraocular lens: Secondary | ICD-10-CM | POA: Diagnosis not present

## 2015-11-21 DIAGNOSIS — H2512 Age-related nuclear cataract, left eye: Secondary | ICD-10-CM | POA: Diagnosis not present

## 2015-11-27 DIAGNOSIS — L821 Other seborrheic keratosis: Secondary | ICD-10-CM | POA: Diagnosis not present

## 2015-12-11 DIAGNOSIS — Z961 Presence of intraocular lens: Secondary | ICD-10-CM | POA: Diagnosis not present

## 2015-12-11 DIAGNOSIS — H2512 Age-related nuclear cataract, left eye: Secondary | ICD-10-CM | POA: Diagnosis not present

## 2015-12-11 DIAGNOSIS — H52223 Regular astigmatism, bilateral: Secondary | ICD-10-CM | POA: Diagnosis not present

## 2015-12-11 DIAGNOSIS — H25812 Combined forms of age-related cataract, left eye: Secondary | ICD-10-CM | POA: Diagnosis not present

## 2015-12-11 DIAGNOSIS — H5202 Hypermetropia, left eye: Secondary | ICD-10-CM | POA: Diagnosis not present

## 2015-12-11 DIAGNOSIS — Z9849 Cataract extraction status, unspecified eye: Secondary | ICD-10-CM | POA: Diagnosis not present

## 2016-07-04 ENCOUNTER — Emergency Department (HOSPITAL_COMMUNITY)
Admission: EM | Admit: 2016-07-04 | Discharge: 2016-07-05 | Disposition: A | Discharge status: Home / Self Care | Payer: Medicare Other | Attending: Emergency Medicine | Admitting: Emergency Medicine

## 2016-07-04 ENCOUNTER — Emergency Department (HOSPITAL_COMMUNITY): Payer: Medicare Other

## 2016-07-04 ENCOUNTER — Encounter (HOSPITAL_COMMUNITY): Payer: Self-pay | Admitting: *Deleted

## 2016-07-04 DIAGNOSIS — Y9355 Activity, bike riding: Secondary | ICD-10-CM | POA: Insufficient documentation

## 2016-07-04 DIAGNOSIS — R930 Abnormal findings on diagnostic imaging of skull and head, not elsewhere classified: Secondary | ICD-10-CM | POA: Diagnosis not present

## 2016-07-04 DIAGNOSIS — S43101A Unspecified dislocation of right acromioclavicular joint, initial encounter: Secondary | ICD-10-CM | POA: Diagnosis not present

## 2016-07-04 DIAGNOSIS — S30811A Abrasion of abdominal wall, initial encounter: Secondary | ICD-10-CM | POA: Diagnosis not present

## 2016-07-04 DIAGNOSIS — Y92828 Other wilderness area as the place of occurrence of the external cause: Secondary | ICD-10-CM | POA: Insufficient documentation

## 2016-07-04 DIAGNOSIS — I10 Essential (primary) hypertension: Secondary | ICD-10-CM | POA: Insufficient documentation

## 2016-07-04 DIAGNOSIS — Y999 Unspecified external cause status: Secondary | ICD-10-CM | POA: Diagnosis not present

## 2016-07-04 DIAGNOSIS — S0081XA Abrasion of other part of head, initial encounter: Secondary | ICD-10-CM | POA: Insufficient documentation

## 2016-07-04 DIAGNOSIS — S299XXA Unspecified injury of thorax, initial encounter: Secondary | ICD-10-CM | POA: Diagnosis not present

## 2016-07-04 DIAGNOSIS — Z7982 Long term (current) use of aspirin: Secondary | ICD-10-CM | POA: Insufficient documentation

## 2016-07-04 DIAGNOSIS — S4991XA Unspecified injury of right shoulder and upper arm, initial encounter: Secondary | ICD-10-CM | POA: Diagnosis not present

## 2016-07-04 DIAGNOSIS — S0990XA Unspecified injury of head, initial encounter: Secondary | ICD-10-CM | POA: Diagnosis not present

## 2016-07-04 DIAGNOSIS — M25511 Pain in right shoulder: Secondary | ICD-10-CM | POA: Diagnosis not present

## 2016-07-04 HISTORY — PX: OTHER SURGICAL HISTORY: SHX169

## 2016-07-04 MED ORDER — ACETAMINOPHEN 500 MG PO TABS
500.0000 mg | ORAL_TABLET | Freq: Once | ORAL | Status: AC
  Administered 2016-07-04: 500 mg via ORAL
  Filled 2016-07-04: qty 1

## 2016-07-04 MED ORDER — ACETAMINOPHEN 500 MG PO TABS: 500.0000 mg | ORAL_TABLET | Freq: Once | ORAL | Status: DC

## 2016-07-04 NOTE — ED Notes (Signed)
Pt ambulated to restroom independently.

## 2016-07-04 NOTE — ED Notes (Signed)
Pt transported to CT ?
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
## 2016-07-04 NOTE — ED Notes (Signed)
Pt given incentive spirometer and was able to inhale 1041ml. Pt educated on use of IS.
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
## 2016-07-05 DIAGNOSIS — M25511 Pain in right shoulder: Secondary | ICD-10-CM | POA: Diagnosis not present

## 2016-07-05 DIAGNOSIS — S43101A Unspecified dislocation of right acromioclavicular joint, initial encounter: Secondary | ICD-10-CM | POA: Diagnosis not present

## 2016-07-05 NOTE — ED Notes (Signed)
MD at bedside. 

## 2016-07-08 DIAGNOSIS — S2231XS Fracture of one rib, right side, sequela: Secondary | ICD-10-CM | POA: Diagnosis not present

## 2016-07-08 DIAGNOSIS — I10 Essential (primary) hypertension: Secondary | ICD-10-CM | POA: Diagnosis not present

## 2016-07-08 DIAGNOSIS — S43101A Unspecified dislocation of right acromioclavicular joint, initial encounter: Secondary | ICD-10-CM | POA: Diagnosis not present

## 2016-07-11 DIAGNOSIS — S30811A Abrasion of abdominal wall, initial encounter: Secondary | ICD-10-CM | POA: Diagnosis not present

## 2016-07-11 DIAGNOSIS — S2231XS Fracture of one rib, right side, sequela: Secondary | ICD-10-CM | POA: Diagnosis not present

## 2016-07-11 DIAGNOSIS — W19XXXA Unspecified fall, initial encounter: Secondary | ICD-10-CM | POA: Diagnosis not present

## 2016-07-15 ENCOUNTER — Other Ambulatory Visit: Payer: Self-pay | Admitting: Internal Medicine

## 2016-07-15 DIAGNOSIS — T148XXA Other injury of unspecified body region, initial encounter: Secondary | ICD-10-CM

## 2016-07-17 DIAGNOSIS — M7981 Nontraumatic hematoma of soft tissue: Secondary | ICD-10-CM | POA: Diagnosis not present

## 2016-07-17 DIAGNOSIS — Z23 Encounter for immunization: Secondary | ICD-10-CM | POA: Diagnosis not present

## 2016-07-19 ENCOUNTER — Ambulatory Visit
Admission: RE | Admit: 2016-07-19 | Discharge: 2016-07-19 | Disposition: A | Discharge status: Home / Self Care | Payer: Medicare Other | Source: Ambulatory Visit | Attending: Internal Medicine | Admitting: Internal Medicine

## 2016-07-19 DIAGNOSIS — T148XXA Other injury of unspecified body region, initial encounter: Secondary | ICD-10-CM

## 2016-07-19 DIAGNOSIS — S301XXA Contusion of abdominal wall, initial encounter: Secondary | ICD-10-CM | POA: Diagnosis not present

## 2016-07-19 MED ORDER — IOPAMIDOL (ISOVUE-300) INJECTION 61%
100.0000 mL | Freq: Once | INTRAVENOUS | Status: AC | PRN
  Administered 2016-07-19: 100 mL via INTRAVENOUS

## 2016-07-31 DIAGNOSIS — S43084D Other dislocation of right shoulder joint, subsequent encounter: Secondary | ICD-10-CM | POA: Diagnosis not present

## 2016-08-28 DIAGNOSIS — H5203 Hypermetropia, bilateral: Secondary | ICD-10-CM | POA: Diagnosis not present

## 2016-08-28 DIAGNOSIS — H524 Presbyopia: Secondary | ICD-10-CM | POA: Diagnosis not present

## 2016-08-28 DIAGNOSIS — Z9849 Cataract extraction status, unspecified eye: Secondary | ICD-10-CM | POA: Diagnosis not present

## 2016-08-28 DIAGNOSIS — Z961 Presence of intraocular lens: Secondary | ICD-10-CM | POA: Diagnosis not present

## 2016-08-28 DIAGNOSIS — H52223 Regular astigmatism, bilateral: Secondary | ICD-10-CM | POA: Diagnosis not present

## 2016-08-28 DIAGNOSIS — H43393 Other vitreous opacities, bilateral: Secondary | ICD-10-CM | POA: Diagnosis not present

## 2016-10-10 DIAGNOSIS — L299 Pruritus, unspecified: Secondary | ICD-10-CM | POA: Diagnosis not present

## 2016-10-10 DIAGNOSIS — L570 Actinic keratosis: Secondary | ICD-10-CM | POA: Diagnosis not present

## 2016-10-10 DIAGNOSIS — L814 Other melanin hyperpigmentation: Secondary | ICD-10-CM | POA: Diagnosis not present

## 2016-10-10 DIAGNOSIS — L821 Other seborrheic keratosis: Secondary | ICD-10-CM | POA: Diagnosis not present

## 2016-10-10 DIAGNOSIS — D1801 Hemangioma of skin and subcutaneous tissue: Secondary | ICD-10-CM | POA: Diagnosis not present

## 2016-10-10 DIAGNOSIS — D235 Other benign neoplasm of skin of trunk: Secondary | ICD-10-CM | POA: Diagnosis not present

## 2016-10-30 DIAGNOSIS — I10 Essential (primary) hypertension: Secondary | ICD-10-CM | POA: Diagnosis not present

## 2016-10-30 DIAGNOSIS — Z125 Encounter for screening for malignant neoplasm of prostate: Secondary | ICD-10-CM | POA: Diagnosis not present

## 2016-10-30 DIAGNOSIS — F419 Anxiety disorder, unspecified: Secondary | ICD-10-CM | POA: Diagnosis not present

## 2016-11-06 DIAGNOSIS — I10 Essential (primary) hypertension: Secondary | ICD-10-CM | POA: Diagnosis not present

## 2016-11-06 DIAGNOSIS — Z0000 Encounter for general adult medical examination without abnormal findings: Secondary | ICD-10-CM | POA: Diagnosis not present

## 2016-11-06 DIAGNOSIS — F419 Anxiety disorder, unspecified: Secondary | ICD-10-CM | POA: Diagnosis not present

## 2016-11-06 DIAGNOSIS — J42 Unspecified chronic bronchitis: Secondary | ICD-10-CM | POA: Diagnosis not present

## 2017-05-01 DIAGNOSIS — I10 Essential (primary) hypertension: Secondary | ICD-10-CM | POA: Diagnosis not present

## 2017-05-08 DIAGNOSIS — I10 Essential (primary) hypertension: Secondary | ICD-10-CM | POA: Diagnosis not present

## 2017-05-08 DIAGNOSIS — Z0000 Encounter for general adult medical examination without abnormal findings: Secondary | ICD-10-CM | POA: Diagnosis not present

## 2017-05-08 DIAGNOSIS — R739 Hyperglycemia, unspecified: Secondary | ICD-10-CM | POA: Diagnosis not present

## 2017-05-08 DIAGNOSIS — Z125 Encounter for screening for malignant neoplasm of prostate: Secondary | ICD-10-CM | POA: Diagnosis not present

## 2017-06-26 DIAGNOSIS — F419 Anxiety disorder, unspecified: Secondary | ICD-10-CM | POA: Diagnosis not present

## 2017-07-11 DIAGNOSIS — Z23 Encounter for immunization: Secondary | ICD-10-CM | POA: Diagnosis not present

## 2017-07-24 DIAGNOSIS — I10 Essential (primary) hypertension: Secondary | ICD-10-CM | POA: Diagnosis not present

## 2017-07-24 DIAGNOSIS — F419 Anxiety disorder, unspecified: Secondary | ICD-10-CM | POA: Diagnosis not present

## 2017-08-25 ENCOUNTER — Encounter (HOSPITAL_BASED_OUTPATIENT_CLINIC_OR_DEPARTMENT_OTHER): Payer: Self-pay | Admitting: Emergency Medicine

## 2017-08-25 ENCOUNTER — Emergency Department (HOSPITAL_BASED_OUTPATIENT_CLINIC_OR_DEPARTMENT_OTHER)
Admission: EM | Admit: 2017-08-25 | Discharge: 2017-08-25 | Disposition: A | Discharge status: Home / Self Care | Payer: Medicare Other | Attending: Emergency Medicine | Admitting: Emergency Medicine

## 2017-08-25 ENCOUNTER — Other Ambulatory Visit: Payer: Self-pay

## 2017-08-25 DIAGNOSIS — Z7982 Long term (current) use of aspirin: Secondary | ICD-10-CM | POA: Diagnosis not present

## 2017-08-25 DIAGNOSIS — I110 Hypertensive heart disease with heart failure: Secondary | ICD-10-CM | POA: Insufficient documentation

## 2017-08-25 DIAGNOSIS — R4182 Altered mental status, unspecified: Secondary | ICD-10-CM | POA: Insufficient documentation

## 2017-08-25 DIAGNOSIS — I5030 Unspecified diastolic (congestive) heart failure: Secondary | ICD-10-CM | POA: Insufficient documentation

## 2017-08-25 DIAGNOSIS — Z79899 Other long term (current) drug therapy: Secondary | ICD-10-CM | POA: Diagnosis not present

## 2017-08-25 DIAGNOSIS — Z789 Other specified health status: Secondary | ICD-10-CM | POA: Diagnosis not present

## 2017-08-25 DIAGNOSIS — R4189 Other symptoms and signs involving cognitive functions and awareness: Secondary | ICD-10-CM | POA: Diagnosis not present

## 2017-08-25 LAB — COMPREHENSIVE METABOLIC PANEL
ALK PHOS: 78 U/L (ref 38–126)
ALT: 23 U/L (ref 17–63)
AST: 28 U/L (ref 15–41)
Albumin: 4.1 g/dL (ref 3.5–5.0)
Anion gap: 6 (ref 5–15)
BILIRUBIN TOTAL: 1.4 mg/dL — AB (ref 0.3–1.2)
BUN: 19 mg/dL (ref 6–20)
CO2: 31 mmol/L (ref 22–32)
CREATININE: 1.17 mg/dL (ref 0.61–1.24)
Calcium: 9.5 mg/dL (ref 8.9–10.3)
Chloride: 96 mmol/L — ABNORMAL LOW (ref 101–111)
GFR calc Af Amer: >60 mL/min (ref >60)
GFR, EST NON AFRICAN AMERICAN: 59 mL/min — AB (ref >60)
Glucose, Bld: 123 mg/dL — ABNORMAL HIGH (ref 65–99)
Potassium: 3.6 mmol/L (ref 3.5–5.1)
Sodium: 133 mmol/L — ABNORMAL LOW (ref 135–145)
TOTAL PROTEIN: 7.2 g/dL (ref 6.5–8.1)

## 2017-08-25 LAB — URINALYSIS, ROUTINE W REFLEX MICROSCOPIC
Bilirubin Urine: NEGATIVE
GLUCOSE, UA: NEGATIVE mg/dL
Hgb urine dipstick: NEGATIVE
KETONES UR: NEGATIVE mg/dL
LEUKOCYTES UA: NEGATIVE
Nitrite: NEGATIVE
PROTEIN: NEGATIVE mg/dL
Specific Gravity, Urine: 1.015 (ref 1.005–1.030)
pH: 8 (ref 5.0–8.0)

## 2017-08-25 LAB — CBC
HCT: 45.7 % (ref 39.0–52.0)
Hemoglobin: 15.6 g/dL (ref 13.0–17.0)
MCH: 31.2 pg (ref 26.0–34.0)
MCHC: 34.1 g/dL (ref 30.0–36.0)
MCV: 91.4 fL (ref 78.0–100.0)
PLATELETS: 241 10*3/uL (ref 150–400)
RBC: 5 MIL/uL (ref 4.22–5.81)
RDW: 12.9 % (ref 11.5–15.5)
WBC: 7.8 10*3/uL (ref 4.0–10.5)

## 2017-08-25 LAB — CBG MONITORING, ED: GLUCOSE-CAPILLARY: 121 mg/dL — AB (ref 65–99)

## 2017-08-25 NOTE — ED Provider Notes (Signed)
[facility] EMERGENCY DEPARTMENT Provider Note  CSN: [id number] Arrival date & time: 08/25/17 1025  Chief Complaint(s) Altered Mental Status  HPI Charles [name] is a 76 y.o. male with a history of hypertension who presents to the emergency department as a transfer from primary care provider's office for several months of cognitive difficulties.  Family reports that the patient has had episodes where he has difficulty making decisions.  This has worsened over the past 2 weeks.  Patient was evaluated 2 weeks ago by the PCP for symptoms of anxiety and increased stress.  He was placed on Lexapro at that time.  No other medication changes made.  They report that the patient has difficulty deciding what to eat at times and whether or not to go to work.  They note that he does not have any difficulty with his memory.  No change in his speech.  He denies any difficulty swallowing, focal deficits.  He denies any headache, vision changes, chest pain, palpitations, shortness of breath.  No recent fevers or infections.  No recent trauma.  The history is provided by the patient and a relative.    Past Medical History Past Medical History:  Diagnosis Date  . Aortic regurgitation   . Diastolic dysfunction   . Hypertension    There are no active problems to display for this patient.  Home Medication(s) Prior to Admission medications   Medication Sig Start Date End Date Taking? Authorizing Provider  escitalopram (LEXAPRO) 20 MG tablet Take 20 mg by mouth daily. 07/25/17  Yes [provider]  acetaminophen (TYLENOL) 325 MG tablet Take 650 mg by mouth every 6 (six) hours as needed for mild pain.    [provider]  amLODipine (NORVASC) 2.5 MG tablet Take 2.5 mg by mouth daily. 05/01/16   [provider]  aspirin EC 81 MG tablet Take 81 mg by mouth daily.    [provider]  hydrochlorothiazide (HYDRODIURIL) 25 MG tablet Take 25 mg by mouth daily.    [provider]  quinapril (ACCUPRIL) 40 MG tablet Take 40 mg by mouth 2 (two) times daily.    [provider]                                                                                                                                    Past Surgical History Past Surgical History:  Procedure Laterality Date  . EYE SURGERY    . HERNIA REPAIR    . NM MYOVIEW LTD    . US ECHOCARDIOGRAPHY     Family History Family History  Problem Relation Age of Onset  . Hypertension Mother   . Lung cancer Mother   . Diabetes Mother   . Thyroid disease Mother   . Angina Father   . Heart attack Father   . Heart disease Brother   . Diabetes Brother   . Heart attack Brother   .  Asthma Maternal Grandmother   . Stroke Maternal Grandfather   . Diabetes Paternal Grandfather     Social History Social History   Tobacco Use  . Smoking status: Never Smoker  . Smokeless tobacco: Never Used  Substance Use Topics  . Alcohol use: No  . Drug use: No   Allergies Patient has no known allergies.  Review of Systems Review of Systems All other systems are reviewed and are negative for acute change except as noted in the HPI  Physical Exam Vital Signs  I have reviewed the triage vital signs BP (!) 151/72   Pulse 66   Temp 97.8 F (36.6 C) (Oral)   Resp 19   Ht 5\' 10"  (1.778 m)   Wt 70.3 kg (155 lb)   SpO2 100%   BMI 22.24 kg/m    Physical Exam  Constitutional: He is oriented to person, place, and time. He appears well-developed and well-nourished. No distress.  HENT:  Head: Normocephalic and atraumatic.  Nose: Nose normal.  Eyes: Conjunctivae and EOM are normal. Pupils are equal, round, and reactive to light. Right eye exhibits no discharge. Left eye exhibits no discharge. No scleral icterus.  Neck: Normal range of motion. Neck supple.  Cardiovascular: Normal rate and regular rhythm. Exam reveals no gallop and no friction rub.  No murmur heard. Pulmonary/Chest: Effort normal  and breath sounds normal. No stridor. No respiratory distress. He has no rales.  Abdominal: Soft. He exhibits no distension. There is no tenderness.  Musculoskeletal: He exhibits no edema or tenderness.  Neurological: He is alert and oriented to person, place, and time.  Mental Status:  Alert and oriented to person, place, and time.  Attention and concentration normal.  Speech clear.  Recent memory is intact  Cranial Nerves:  II Visual Fields: Intact to confrontation. Visual fields intact. III, IV, VI: Pupils equal and reactive to light and near. Full eye movement without nystagmus  V Facial Sensation: Normal. No weakness of masticatory muscles  VII: No facial weakness or asymmetry  VIII Auditory Acuity: Grossly normal  IX/X: The uvula is midline; the palate elevates symmetrically  XI: Normal sternocleidomastoid and trapezius strength  XII: The tongue is midline. No atrophy or fasciculations.   Motor System: Muscle Strength: 5/5 and symmetric in the upper and lower extremities. No pronation or drift.  Muscle Tone: Tone and muscle bulk are normal in the upper and lower extremities.   Reflexes: DTRs: 1+ and symmetrical in all four extremities. No Clonus Coordination: Intact finger-to-nose, heel-to-shin. No tremor.  Sensation: Intact to light touch, and pinprick.  Gait: Routine gait normal.   Skin: Skin is warm and dry. No rash noted. He is not diaphoretic. No erythema.  Psychiatric: He has a normal mood and affect.  Vitals reviewed.   ED Results and Treatments Labs (all labs ordered are listed, but only abnormal results are displayed) Labs Reviewed  COMPREHENSIVE METABOLIC PANEL - Abnormal; Notable for the following components:      Result Value   Sodium 133 (*)    Chloride 96 (*)    Glucose, Bld 123 (*)    Total Bilirubin 1.4 (*)    GFR calc non Af Amer 59 (*)    All other components within normal limits  URINALYSIS, ROUTINE W REFLEX MICROSCOPIC - Abnormal; Notable for the  following components:   APPearance CLOUDY (*)    All other components within normal limits  CBG MONITORING, ED - Abnormal; Notable for the following components:   Glucose-Capillary 121 (*)  All other components within normal limits  CBC                                                                                                                         EKG  EKG Interpretation  Date/Time:    Ventricular Rate:    PR Interval:    QRS Duration:   QT Interval:    QTC Calculation:   R Axis:     Text Interpretation:        Radiology No results found. Pertinent labs & imaging results that were available during my care of the patient were reviewed by me and considered in my medical decision making (see chart for details).  Medications Ordered in ED Medications - No data to display                                                                                                                                  Procedures Procedures  (including critical care time)  Medical Decision Making / ED Course I have reviewed the nursing notes for this encounter and the patient's prior records (if available in EHR or on provided paperwork).    Exam nonfocal.  Memory intact.  Patient able to follow detailed instructions.  No electrolyte derangements.  UA without evidence of infection.   Discussed the case with the patient's primary care provider, Dr. Maudie Mercury.  Also discussed with the family possibility for CT scan of the brain.  With shared decision making patient and the family decided to withhold from getting the CT scan as they have a close follow-up with her primary care provider in 3 days, at which point the imaging could be obtained.  Informed him that there is possibility that this may be related to a small stroke in addition to several other etiologies.  However since this is been ongoing for several months with minimal effect to ADLs, there was no need for emergent imaging.  They will  discussed the need for MRIs with primary care provider.  The patient appears reasonably screened and/or stabilized for discharge and I doubt any other medical condition or other [facility] requiring further screening, evaluation, or treatment in the ED at this time prior to discharge.  The patient is safe for discharge with strict return precautions.   Final Clinical Impression(s) / ED Diagnoses Final diagnoses:  Impaired decision making    Disposition: Discharge  Condition: Good  I have discussed the results, Dx and Tx plan with the patient and family who expressed understanding and agree(s) with the plan. Discharge instructions discussed at great length. The patient and family were given strict return precautions who verbalized understanding of the instructions. No further questions at time of discharge.    ED Discharge Orders    None       Follow Up: Jani Gravel, MD [address] [place] [id number] [phone]  On 08/28/2017 As scheduled     This chart was dictated using voice recognition software.  Despite best efforts to proofread,  errors can occur which can change the documentation meaning.   Fatima Blank, MD 08/25/17 [phone]

## 2017-08-25 NOTE — ED Triage Notes (Signed)
Per family, pt has been having some confusion for two months, worse since Friday.  Pt denies any problems.  Pt oriented to self, time and place.  Negative LVO scale.  Negative BEFAST.   Pt referred by PCP for head CT.

## 2017-08-25 NOTE — ED Notes (Signed)
ED Provider at bedside. 

## 2017-08-28 ENCOUNTER — Other Ambulatory Visit: Payer: Self-pay | Admitting: Internal Medicine

## 2017-08-28 DIAGNOSIS — R4182 Altered mental status, unspecified: Secondary | ICD-10-CM

## 2017-08-28 DIAGNOSIS — R258 Other abnormal involuntary movements: Secondary | ICD-10-CM | POA: Diagnosis not present

## 2017-09-10 ENCOUNTER — Other Ambulatory Visit: Payer: Medicare Other

## 2017-09-19 ENCOUNTER — Ambulatory Visit
Admission: RE | Admit: 2017-09-19 | Discharge: 2017-09-19 | Disposition: A | Discharge status: Home / Self Care | Payer: Medicare Other | Source: Ambulatory Visit | Attending: Internal Medicine | Admitting: Internal Medicine

## 2017-09-19 DIAGNOSIS — R413 Other amnesia: Secondary | ICD-10-CM | POA: Diagnosis not present

## 2017-09-19 DIAGNOSIS — R4182 Altered mental status, unspecified: Secondary | ICD-10-CM

## 2017-10-09 DIAGNOSIS — I10 Essential (primary) hypertension: Secondary | ICD-10-CM | POA: Diagnosis not present

## 2017-10-09 DIAGNOSIS — R4182 Altered mental status, unspecified: Secondary | ICD-10-CM | POA: Diagnosis not present

## 2017-10-09 DIAGNOSIS — R634 Abnormal weight loss: Secondary | ICD-10-CM | POA: Diagnosis not present

## 2017-10-09 DIAGNOSIS — F419 Anxiety disorder, unspecified: Secondary | ICD-10-CM | POA: Diagnosis not present

## 2017-10-10 ENCOUNTER — Other Ambulatory Visit: Payer: Self-pay | Admitting: Internal Medicine

## 2017-10-10 DIAGNOSIS — R634 Abnormal weight loss: Secondary | ICD-10-CM

## 2017-10-14 ENCOUNTER — Encounter: Payer: Self-pay | Admitting: Neurology

## 2017-10-14 ENCOUNTER — Telehealth: Payer: Self-pay | Admitting: Neurology

## 2017-10-14 ENCOUNTER — Ambulatory Visit (INDEPENDENT_AMBULATORY_CARE_PROVIDER_SITE_OTHER): Payer: Medicare Other | Admitting: Neurology

## 2017-10-14 VITALS — BP 113/54 | HR 61 | Ht 69.0 in | Wt 130.0 lb

## 2017-10-14 DIAGNOSIS — R251 Tremor, unspecified: Secondary | ICD-10-CM

## 2017-10-14 DIAGNOSIS — R4189 Other symptoms and signs involving cognitive functions and awareness: Secondary | ICD-10-CM | POA: Diagnosis not present

## 2017-10-14 DIAGNOSIS — F05 Delirium due to known physiological condition: Secondary | ICD-10-CM

## 2017-10-14 DIAGNOSIS — E538 Deficiency of other specified B group vitamins: Secondary | ICD-10-CM | POA: Diagnosis not present

## 2017-10-14 DIAGNOSIS — G309 Alzheimer's disease, unspecified: Secondary | ICD-10-CM | POA: Diagnosis not present

## 2017-10-14 DIAGNOSIS — R410 Disorientation, unspecified: Secondary | ICD-10-CM | POA: Insufficient documentation

## 2017-10-14 NOTE — Patient Instructions (Signed)
FDG PET SCAN Labs Formal Neurocognitive testing   Dementia Dementia is the loss of two or more brain functions, such as:  Memory.  Decision making.  Behavior.  Speaking.  Thinking.  Problem solving.  There are many types of dementia. The most common type is called progressive dementia. Progressive dementia gets worse with time and it is irreversible. An example of this type of dementia is Alzheimer disease. What are the causes? This condition may be caused by:  Nerve cell damage in the brain.  Genetic mutations.  Certain medicines.  Multiple small strokes.  An infection, such as chronic meningitis.  A metabolic problem, such as vitamin B12 deficiency or thyroid disease.  Pressure on the brain, such as from a tumor or blood clot.  What are the signs or symptoms? Symptoms of this condition include:  Sudden changes in mood.  Depression.  Problems with balance.  Changes in personality.  Poor short-term memory.  Agitation.  Delusions.  Hallucinations.  Having a hard time: ? Speaking thoughts. ? Finding words. ? Solving problems. ? Doing familiar tasks. ? Understanding familiar ideas.  How is this diagnosed? This condition is diagnosed with an assessment by your health care provider. During this assessment, your health care provider will talk with you and your family, friends, or caregivers about your symptoms. A thorough medical history will be taken, and you will have a physical exam and tests. Tests may include:  Lab tests, such as blood or urine tests.  Imaging tests, such as a CT scan, PET scan, or MRI.  A lumbar puncture. This test involves removing and testing a small amount of the fluid that surrounds the brain and spinal cord.  An electroencephalogram (EEG). In this test, small metal discs are used to measure electrical activity in the brain.  Memory tests, cognitive tests, and neuropsychological tests. These tests evaluate brain  function.  How is this treated? Treatment depends on the cause of the dementia. It may involve taking medicines that may help:  To control the dementia.  To slow down the disease.  To manage symptoms.  In some cases, treating the cause of the dementia can improve symptoms, reverse symptoms, or slow down how quickly the dementia gets worse. Your health care provider can help direct you to support groups, organizations, and other health care providers who can help with decisions about your care. Follow these instructions at home: Medicine  Take over-the-counter and prescription medicines only as told by your health care provider.  Avoid taking medicines that can affect thinking, such as pain or sleeping medicines. Lifestyle   Make healthy lifestyle choices: ? Be physically active as told by your health care provider. ? Do not use any tobacco products, such as cigarettes, chewing tobacco, and e-cigarettes. If you need help quitting, ask your health care provider. ? Eat a healthy diet. ? Practice stress-management techniques when you get stressed. ? Stay social.  Drink enough fluid to keep your urine clear or pale yellow.  Make sure to get quality sleep. These tips can help you to get a good night's rest: ? Avoid napping during the day. ? Keep your sleeping area dark and cool. ? Avoid exercising during the few hours before you go to bed. ? Avoid caffeine products in the evening. General instructions  Work with your health care provider to determine what you need help with and what your safety needs are.  If you were given a bracelet that tracks your location, make sure to wear it.  Keep all follow-up visits as told by your health care provider. This is important. Contact a health care provider if:  You have any new symptoms.  You have problems with choking or swallowing.  You have any symptoms of a different illness. Get help right away if:  You develop a fever.  You  have new or worsening confusion.  You have new or worsening sleepiness.  You have a hard time staying awake.  You or your family members become concerned for your safety. This information is not intended to replace advice given to you by your health care provider. Make sure you discuss any questions you have with your health care provider. Document Released: 03/12/2001 Document Revised: 01/25/2016 Document Reviewed: 06/14/2015 Elsevier Interactive Patient Education  [employer].

## 2017-10-14 NOTE — Telephone Encounter (Signed)
Charles [name], I ordered FDG Pet Scan, would you please tell me what the earliest we can get patient in to that test please for dementia (FDG Brain Metabolic Scan).  Also, when is the next available for Dr Si Raider? If she is booked out more than 12 weeks we may have to send him eleswhere thanks
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
## 2017-10-15 ENCOUNTER — Telehealth: Payer: Self-pay | Admitting: *Deleted

## 2017-10-15 NOTE — Telephone Encounter (Signed)
I know Dr.Bailiar is only working two days a week. Dr. Vikki Ports is booked as well.  PET scan is processing I cant call on those . Courtney from [facility] has a process and she calls the patient's she want let me schedule over the phone . I have spoke to patient's wife and gave her the number. [phone]  I relayed to her if she did not have apt with in a week to call the and me back.

## 2017-10-15 NOTE — Telephone Encounter (Signed)
Called and spoke with pt's EC and wife Charles [name]. She is aware that his labs are normal. She states that the EEG is scheduled for the 23rd and will continue to await other calls for scheduling the other tests.

## 2017-10-15 NOTE — Telephone Encounter (Signed)
Let family know to schedule first avail with Dr. Si Raider. In the meantime if that is too long we can try to find someone else but we may have to wait. In the meantime we are ordering labs that may shed light on diagnosis so reassure them to habg in there thanks

## 2017-10-15 NOTE — Telephone Encounter (Signed)
-----   Message from Melvenia Beam, MD sent at 10/15/2017  7:49 AM EST ----- Labs normal thanks

## 2017-10-16 ENCOUNTER — Encounter: Payer: Self-pay | Admitting: Psychology

## 2017-10-16 NOTE — Telephone Encounter (Signed)
Dr. Jaynee Eagles Patient can't be seen until May I will try Dr. Beverly Gust and see if I can get patient seen any sooner . I spoke to patient's wife she is aware.

## 2017-10-17 ENCOUNTER — Ambulatory Visit
Admission: RE | Admit: 2017-10-17 | Discharge: 2017-10-17 | Disposition: A | Discharge status: Home / Self Care | Payer: Medicare Other | Source: Ambulatory Visit | Attending: Internal Medicine | Admitting: Internal Medicine

## 2017-10-17 DIAGNOSIS — R634 Abnormal weight loss: Secondary | ICD-10-CM

## 2017-10-17 DIAGNOSIS — I700 Atherosclerosis of aorta: Secondary | ICD-10-CM | POA: Diagnosis not present

## 2017-10-17 LAB — BASIC METABOLIC PANEL
BUN / CREAT RATIO: 16 (ref 10–24)
BUN: 17 mg/dL (ref 8–27)
CO2: 27 mmol/L (ref 20–29)
CREATININE: 1.06 mg/dL (ref 0.76–1.27)
Calcium: 9.9 mg/dL (ref 8.6–10.2)
Chloride: 100 mmol/L (ref 96–106)
GFR calc Af Amer: 79 mL/min/{1.73_m2} (ref >59)
GFR calc non Af Amer: 68 mL/min/{1.73_m2} (ref >59)
GLUCOSE: 114 mg/dL — AB (ref 65–99)
POTASSIUM: 4.9 mmol/L (ref 3.5–5.2)
SODIUM: 143 mmol/L (ref 134–144)

## 2017-10-17 LAB — B12 AND FOLATE PANEL
Folate: >20 ng/mL (ref >3.0)
VITAMIN B 12: 711 pg/mL (ref 232–1245)

## 2017-10-17 LAB — METHYLMALONIC ACID, SERUM: METHYLMALONIC ACID: 279 nmol/L (ref 0–378)

## 2017-10-17 MED ORDER — IOPAMIDOL (ISOVUE-300) INJECTION 61%
100.0000 mL | Freq: Once | INTRAVENOUS | Status: AC | PRN
  Administered 2017-10-17: 100 mL via INTRAVENOUS

## 2017-10-22 ENCOUNTER — Ambulatory Visit (INDEPENDENT_AMBULATORY_CARE_PROVIDER_SITE_OTHER): Payer: Medicare Other | Admitting: Neurology

## 2017-10-22 DIAGNOSIS — E538 Deficiency of other specified B group vitamins: Secondary | ICD-10-CM

## 2017-10-22 DIAGNOSIS — R251 Tremor, unspecified: Secondary | ICD-10-CM

## 2017-10-22 DIAGNOSIS — R4182 Altered mental status, unspecified: Secondary | ICD-10-CM | POA: Diagnosis not present

## 2017-10-22 DIAGNOSIS — R4189 Other symptoms and signs involving cognitive functions and awareness: Secondary | ICD-10-CM

## 2017-10-24 NOTE — Procedures (Signed)
   HISTORY: 77 years old male, with history of sudden onset altered mental status, confusion spells.  TECHNIQUE:  16 channel EEG was performed based on standard 10-16 international system. One channel was dedicated to EKG, which has demonstrates normal sinus rhythm of 66 beats per minutes.  Upon awakening, the posterior background activity was well-developed, in alpha range, 10 Hz, reactive to eye opening and closure.  There was no evidence of epileptiform discharge.  Photic stimulation was performed, which induced a symmetric photic driving.  Hyperventilation was not performed.it.  No sleep was achieved.  CONCLUSION: This is a  normal awake EEG.  There is no electrodiagnostic evidence of epileptiform discharge.  Marcial Pacas, M.D. Ph.D.  [facility] [place], [place] [address] Phone: [phone] Fax:      [phone]

## 2017-10-27 ENCOUNTER — Telehealth: Payer: Self-pay | Admitting: *Deleted

## 2017-10-27 DIAGNOSIS — R739 Hyperglycemia, unspecified: Secondary | ICD-10-CM | POA: Diagnosis not present

## 2017-10-27 DIAGNOSIS — F329 Major depressive disorder, single episode, unspecified: Secondary | ICD-10-CM | POA: Diagnosis not present

## 2017-10-27 DIAGNOSIS — I10 Essential (primary) hypertension: Secondary | ICD-10-CM | POA: Diagnosis not present

## 2017-10-27 DIAGNOSIS — M810 Age-related osteoporosis without current pathological fracture: Secondary | ICD-10-CM | POA: Diagnosis not present

## 2017-10-27 DIAGNOSIS — Z125 Encounter for screening for malignant neoplasm of prostate: Secondary | ICD-10-CM | POA: Diagnosis not present

## 2017-10-27 DIAGNOSIS — R634 Abnormal weight loss: Secondary | ICD-10-CM | POA: Diagnosis not present

## 2017-10-27 LAB — BASIC METABOLIC PANEL
BUN: 2 — AB (ref 4–21)
Creatinine: 1.1 (ref 0.6–1.3)
Glucose: 91
Potassium: 4.3 (ref 3.4–5.3)
Sodium: 142 (ref 137–147)

## 2017-10-27 LAB — LIPID PANEL
Cholesterol: 170 (ref 0–200)
HDL: 83 — AB (ref 35–70)
LDL Cholesterol: 73
Triglycerides: 71 (ref 40–160)

## 2017-10-27 LAB — HEPATIC FUNCTION PANEL
ALT: 34 (ref 10–40)
AST: 28 (ref 14–40)
Alkaline Phosphatase: 84 (ref 25–125)
Bilirubin, Total: 1.5

## 2017-10-27 LAB — CBC AND DIFFERENTIAL
HCT: 45 (ref 41–53)
Hemoglobin: 14.8 (ref 13.5–17.5)
Platelets: 191 (ref 150–399)
WBC: 6.5

## 2017-10-27 LAB — TSH: TSH: 2.11 (ref 0.41–5.90)

## 2017-10-27 LAB — POCT ERYTHROCYTE SEDIMENTATION RATE, NON-AUTOMATED: Sed Rate: 2

## 2017-10-27 LAB — HM DEXA SCAN

## 2017-10-27 LAB — VITAMIN B12: Vitamin B-12: 773

## 2017-10-27 LAB — PSA: PSA: 1.58

## 2017-10-27 NOTE — Telephone Encounter (Addendum)
Called patient and LVM (ok per DPR) informing patient that his EEG is normal. RN informed patient that a return call is not necessary but encouraged patient to call with any questions. Left office number on message.   ----- Message from Melvenia Beam, MD sent at 10/25/2017  9:18 AM EST ----- eeg normal

## 2017-10-29 ENCOUNTER — Ambulatory Visit: Payer: BLUE CROSS/BLUE SHIELD | Admitting: Neurology

## 2017-10-31 ENCOUNTER — Other Ambulatory Visit: Payer: Self-pay | Admitting: Internal Medicine

## 2017-10-31 DIAGNOSIS — K869 Disease of pancreas, unspecified: Secondary | ICD-10-CM

## 2017-11-03 NOTE — Telephone Encounter (Signed)
Spoke to Patient wife and she is going to keep May apt with Dr. Tawny Asal .

## 2017-11-03 NOTE — Telephone Encounter (Signed)
PET Scan is scheduled for February 13 th at 10:30 am NPO 6 hours  Before .Marland Kitchen Check in [facility] Radiology 1 st floor . I have spoke to patient's wife she understands all details. [phone] .

## 2017-11-12 ENCOUNTER — Encounter (HOSPITAL_COMMUNITY)
Admission: RE | Admit: 2017-11-12 | Discharge: 2017-11-12 | Disposition: A | Discharge status: Home / Self Care | Payer: Medicare Other | Source: Ambulatory Visit | Attending: Neurology | Admitting: Neurology

## 2017-11-12 ENCOUNTER — Other Ambulatory Visit: Payer: Medicare Other

## 2017-11-12 DIAGNOSIS — R4189 Other symptoms and signs involving cognitive functions and awareness: Secondary | ICD-10-CM

## 2017-11-12 DIAGNOSIS — G9389 Other specified disorders of brain: Secondary | ICD-10-CM | POA: Insufficient documentation

## 2017-11-12 DIAGNOSIS — R251 Tremor, unspecified: Secondary | ICD-10-CM

## 2017-11-12 DIAGNOSIS — D309 Benign neoplasm of urinary organ, unspecified: Secondary | ICD-10-CM | POA: Diagnosis present

## 2017-11-12 DIAGNOSIS — F0390 Unspecified dementia without behavioral disturbance: Secondary | ICD-10-CM | POA: Diagnosis not present

## 2017-11-12 DIAGNOSIS — G309 Alzheimer's disease, unspecified: Secondary | ICD-10-CM

## 2017-11-12 MED ORDER — FLUDEOXYGLUCOSE F - 18 (FDG) INJECTION
9.9800 | Freq: Once | INTRAVENOUS | Status: AC | PRN
  Administered 2017-11-12: 9.98 via INTRAVENOUS

## 2017-11-13 ENCOUNTER — Telehealth: Payer: Self-pay | Admitting: Neurology

## 2017-11-13 NOTE — Telephone Encounter (Signed)
Patient's daughter will try to get her mother set up on My chart for her dad.

## 2017-11-18 ENCOUNTER — Telehealth: Payer: Self-pay | Admitting: Neurology

## 2017-11-18 ENCOUNTER — Other Ambulatory Visit: Payer: Self-pay | Admitting: Neurology

## 2017-11-18 DIAGNOSIS — G309 Alzheimer's disease, unspecified: Secondary | ICD-10-CM

## 2017-11-18 NOTE — Telephone Encounter (Signed)
[name]: Talked to wife. Ordering beta amyloid PET imaging. Also will see if he can get neuropsych testing earlier if they have a cancellation. Just Rejeana Brock,  I have never ordered a beta amyloid PET imaging. Can you see how we get patient scheduled and what the timeframe it? thanks

## 2017-11-18 NOTE — Telephone Encounter (Signed)
Called left message to call back and discuss findings. 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 Recommend (beta amyloid PET imaging).   IMPRESSION: 1. Subtle decreased cortical metabolism the biparietal lobes concerning for early Alzheimer's type pathology although the subtle relative change is less specific. Consider follow-up examination at some point or utilizing a more specific bio marker exam (beta amyloid PET imaging). 2. Additional mild subtle decreased cortical metabolism within the occipital lobes. This is atypical of Alzheimer's disease and can be found with Lewy body dementia. Again the subtle differences in relative cortical metabolism are less specific.

## 2017-11-18 NOTE — Telephone Encounter (Signed)
Patient's wife returning a call.. She can be reached at [phone].

## 2017-11-19 NOTE — Telephone Encounter (Signed)
Dr. Jaynee Eagles Patient had NM PET METABOLIC BRAIN on 94/76/5465 but but PET Amyloid is not covered buy insurance . Do you want me to ask the family if they want to pay out of pocket they start at $ 7,000 dollars ? Patient is on CX list with Dr. Tawny Asal . Thanks Dr. Jaynee Eagles.

## 2017-11-20 NOTE — Telephone Encounter (Signed)
Let family know insurance will not pay for the imaging. The best thing to do is wait for the formal neurocognitive testing. I have emailed Dr. Si Raider and asked if someone cancels if patient can be seen sooner. thanks

## 2017-11-26 NOTE — Telephone Encounter (Signed)
Noted Family aware. I have left a message. Relayed to call back if any questions. Patient is on CX list with Dr. Tawny Asal. Patient's daughter ware to.

## 2017-12-02 ENCOUNTER — Encounter: Payer: Self-pay | Admitting: Neurology

## 2017-12-02 ENCOUNTER — Telehealth: Payer: Self-pay | Admitting: Neurology

## 2017-12-02 NOTE — Telephone Encounter (Signed)
Called and spoke to  Patient's wife and relayed to her that PET Amyloid was not covered buy insurance . CPT [id number] . Patient's wife stated her and her daughter would still try and call Insurance. I relayed to her that was fine .    Dr. Jaynee Eagles Patient's wife  is wanting to know if there are any support groups ? Patient's wife would like an apt scheduled with Dr. Jaynee Eagles a sooner apt . Patient is already scheduled with Dr. Jarold Song  and patient is on CX list .

## 2017-12-02 NOTE — Telephone Encounter (Signed)
Called pt & LVM asking for call back to get him scheduled with the NP. Left office number & hours in message.   If pt or wife call back, please schedule with Hoyle Sauer or Megan. Hoyle Sauer as of now has openings on Friday 3/8 which I was going to offer first.

## 2017-12-02 NOTE — Telephone Encounter (Signed)
Schedule with Chrys Racer or Jinny Blossom if they have a sooner appointment

## 2017-12-04 NOTE — Telephone Encounter (Addendum)
Called [place], pt's wife (on [place]) and offered appt with Hoyle Sauer on 3/21. Pt's wife stated that she spoke with Hinton Dyer, and the PET Amyloid scan will not be covered by insurance. She stated that the daughter called medicare and found out there is a possibility that they would pay for it. She is willing to pay for the PET Amyloid scan but does not want to pay the $8000 unless the test will be definitive. She stated if the cognitive test will tell them what they need, they would prefer that.   Spoke with Dr. Jaynee Eagles, suggests to wait and let pt complete cognitive testing with Dr. Richrd Sox and ok to f/u with Dr. Jaynee Eagles in office as scheduled on 4/15. Then after cognitive testing can discuss the PET scan.   Spoke with [name]. Discussed that Dr. Jaynee Eagles suggests to proceed with Dr. Thedore Mins appts and she can still see pt in office on 01/12/18 @ 2:00. After Dr. Thedore Mins visits, will talk about PET Amyloid scan. [name] verbalized agreement and appreciation. She stated she will not take the appt on 3/21.

## 2017-12-05 DIAGNOSIS — F0390 Unspecified dementia without behavioral disturbance: Secondary | ICD-10-CM | POA: Diagnosis not present

## 2017-12-09 ENCOUNTER — Ambulatory Visit
Admission: RE | Admit: 2017-12-09 | Discharge: 2017-12-09 | Disposition: A | Discharge status: Home / Self Care | Payer: Medicare Other | Source: Ambulatory Visit | Attending: Internal Medicine | Admitting: Internal Medicine

## 2017-12-09 DIAGNOSIS — K869 Disease of pancreas, unspecified: Secondary | ICD-10-CM

## 2017-12-09 DIAGNOSIS — N281 Cyst of kidney, acquired: Secondary | ICD-10-CM | POA: Diagnosis not present

## 2017-12-09 MED ORDER — GADOBENATE DIMEGLUMINE 529 MG/ML IV SOLN
10.0000 mL | Freq: Once | INTRAVENOUS | Status: AC | PRN
  Administered 2017-12-09: 10 mL via INTRAVENOUS

## 2018-01-12 ENCOUNTER — Encounter: Payer: Self-pay | Admitting: Neurology

## 2018-01-12 ENCOUNTER — Ambulatory Visit (INDEPENDENT_AMBULATORY_CARE_PROVIDER_SITE_OTHER): Payer: Medicare Other | Admitting: Neurology

## 2018-01-12 VITALS — BP 116/64 | HR 60 | Ht 69.0 in | Wt 116.8 lb

## 2018-01-12 DIAGNOSIS — F0390 Unspecified dementia without behavioral disturbance: Secondary | ICD-10-CM | POA: Diagnosis not present

## 2018-01-12 NOTE — Patient Instructions (Signed)
Referral to [facility] Think about Clinical Trials Formal Neurocognitive testing   Dementia Dementia is the loss of two or more brain functions, such as:  Memory.  Decision making.  Behavior.  Speaking.  Thinking.  Problem solving.  There are many types of dementia. The most common type is called progressive dementia. Progressive dementia gets worse with time and it is irreversible. An example of this type of dementia is Alzheimer disease. What are the causes? This condition may be caused by:  Nerve cell damage in the brain.  Genetic mutations.  Certain medicines.  Multiple small strokes.  An infection, such as chronic meningitis.  A metabolic problem, such as vitamin B12 deficiency or thyroid disease.  Pressure on the brain, such as from a tumor or blood clot.  What are the signs or symptoms? Symptoms of this condition include:  Sudden changes in mood.  Depression.  Problems with balance.  Changes in personality.  Poor short-term memory.  Agitation.  Delusions.  Hallucinations.  Having a hard time: ? Speaking thoughts. ? Finding words. ? Solving problems. ? Doing familiar tasks. ? Understanding familiar ideas.  How is this diagnosed? This condition is diagnosed with an assessment by your health care provider. During this assessment, your health care provider will talk with you and your family, friends, or caregivers about your symptoms. A thorough medical history will be taken, and you will have a physical exam and tests. Tests may include:  Lab tests, such as blood or urine tests.  Imaging tests, such as a CT scan, PET scan, or MRI.  A lumbar puncture. This test involves removing and testing a small amount of the fluid that surrounds the brain and spinal cord.  An electroencephalogram (EEG). In this test, small metal discs are used to measure electrical activity in the brain.  Memory tests, cognitive tests, and  neuropsychological tests. These tests evaluate brain function.  How is this treated? Treatment depends on the cause of the dementia. It may involve taking medicines that may help:  To control the dementia.  To slow down the disease.  To manage symptoms.  In some cases, treating the cause of the dementia can improve symptoms, reverse symptoms, or slow down how quickly the dementia gets worse. Your health care provider can help direct you to support groups, organizations, and other health care providers who can help with decisions about your care. Follow these instructions at home: Medicine  Take over-the-counter and prescription medicines only as told by your health care provider.  Avoid taking medicines that can affect thinking, such as pain or sleeping medicines. Lifestyle   Make healthy lifestyle choices: ? Be physically active as told by your health care provider. ? Do not use any tobacco products, such as cigarettes, chewing tobacco, and e-cigarettes. If you need help quitting, ask your health care provider. ? Eat a healthy diet. ? Practice stress-management techniques when you get stressed. ? Stay social.  Drink enough fluid to keep your urine clear or pale yellow.  Make sure to get quality sleep. These tips can help you to get a good night's rest: ? Avoid napping during the day. ? Keep your sleeping area dark and cool. ? Avoid exercising during the few hours before you go to bed. ? Avoid caffeine products in the evening. General instructions  Work with your health care provider to determine what you need help with and what your safety needs are.  If you were given a bracelet that tracks your  location, make sure to wear it.  Keep all follow-up visits as told by your health care provider. This is important. Contact a health care provider if:  You have any new symptoms.  You have problems with choking or swallowing.  You have any symptoms of a different  illness. Get help right away if:  You develop a fever.  You have new or worsening confusion.  You have new or worsening sleepiness.  You have a hard time staying awake.  You or your family members become concerned for your safety. This information is not intended to replace advice given to you by your health care provider. Make sure you discuss any questions you have with your health care provider. Document Released: 03/12/2001 Document Revised: 01/25/2016 Document Reviewed: 06/14/2015 Elsevier Interactive Patient Education  [employer].
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
## 2018-02-04 DIAGNOSIS — F419 Anxiety disorder, unspecified: Secondary | ICD-10-CM | POA: Diagnosis not present

## 2018-02-04 DIAGNOSIS — R739 Hyperglycemia, unspecified: Secondary | ICD-10-CM | POA: Diagnosis not present

## 2018-02-04 DIAGNOSIS — R2681 Unsteadiness on feet: Secondary | ICD-10-CM | POA: Diagnosis not present

## 2018-02-04 DIAGNOSIS — I10 Essential (primary) hypertension: Secondary | ICD-10-CM | POA: Diagnosis not present

## 2018-02-04 DIAGNOSIS — R413 Other amnesia: Secondary | ICD-10-CM | POA: Diagnosis not present

## 2018-02-10 DIAGNOSIS — R2681 Unsteadiness on feet: Secondary | ICD-10-CM | POA: Diagnosis not present

## 2018-02-10 DIAGNOSIS — Z7982 Long term (current) use of aspirin: Secondary | ICD-10-CM | POA: Diagnosis not present

## 2018-02-10 DIAGNOSIS — F419 Anxiety disorder, unspecified: Secondary | ICD-10-CM | POA: Diagnosis not present

## 2018-02-10 DIAGNOSIS — I119 Hypertensive heart disease without heart failure: Secondary | ICD-10-CM | POA: Diagnosis not present

## 2018-02-10 DIAGNOSIS — Z9181 History of falling: Secondary | ICD-10-CM | POA: Diagnosis not present

## 2018-02-10 DIAGNOSIS — R413 Other amnesia: Secondary | ICD-10-CM | POA: Diagnosis not present

## 2018-02-11 ENCOUNTER — Ambulatory Visit (INDEPENDENT_AMBULATORY_CARE_PROVIDER_SITE_OTHER): Payer: Medicare Other | Admitting: Psychology

## 2018-02-11 ENCOUNTER — Encounter

## 2018-02-11 ENCOUNTER — Encounter: Payer: Self-pay | Admitting: Psychology

## 2018-02-11 ENCOUNTER — Ambulatory Visit: Payer: Medicare Other | Admitting: Psychology

## 2018-02-11 DIAGNOSIS — R4689 Other symptoms and signs involving appearance and behavior: Secondary | ICD-10-CM

## 2018-02-11 DIAGNOSIS — F0390 Unspecified dementia without behavioral disturbance: Secondary | ICD-10-CM

## 2018-02-11 DIAGNOSIS — R413 Other amnesia: Secondary | ICD-10-CM

## 2018-02-11 DIAGNOSIS — F419 Anxiety disorder, unspecified: Secondary | ICD-10-CM

## 2018-02-11 NOTE — Progress Notes (Signed)
   Neuropsychology Note  Charles [name] completed 60 minutes of neuropsychological testing with technician, Milana Kidney, BS, under the supervision of Dr. Macarthur Critchley, Licensed Psychologist. The patient did not appear overtly distressed by the testing session, per behavioral observation or via self-report to the technician. Rest breaks were offered.   Clinical Decision Making: In considering the patient's current level of functioning, level of presumed impairment, nature of symptoms, emotional and behavioral responses during the interview, level of literacy, and observed level of motivation/effort, a battery of tests was selected and communicated to the psychometrician.  Communication between the psychologist and technician was ongoing throughout the testing session and changes were made as deemed necessary based on patient performance on testing, technician observations and additional pertinent factors such as those listed above.  Charles [name] will return within approximately 2 weeks for an interactive feedback session with Dr. Si Raider at which time his test performances, clinical impressions and treatment recommendations will be reviewed in detail. The patient understands he can contact our office should he require our assistance before this time.  35 minutes spent performing neuropsychological evaluation services/clinical decision making (psychologist). [CPT 29476] 60 minutes spent face-to-face with patient administering standardized tests, 30 minutes spent scoring (technician). [CPT [id number], 54650]  Full report to follow.
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
## 2018-02-12 ENCOUNTER — Encounter: Payer: Medicare Other | Admitting: Psychology

## 2018-02-16 DIAGNOSIS — F419 Anxiety disorder, unspecified: Secondary | ICD-10-CM | POA: Diagnosis not present

## 2018-02-16 DIAGNOSIS — Z9181 History of falling: Secondary | ICD-10-CM | POA: Diagnosis not present

## 2018-02-16 DIAGNOSIS — R413 Other amnesia: Secondary | ICD-10-CM | POA: Diagnosis not present

## 2018-02-16 DIAGNOSIS — R2681 Unsteadiness on feet: Secondary | ICD-10-CM | POA: Diagnosis not present

## 2018-02-16 DIAGNOSIS — Z7982 Long term (current) use of aspirin: Secondary | ICD-10-CM | POA: Diagnosis not present

## 2018-02-16 DIAGNOSIS — I119 Hypertensive heart disease without heart failure: Secondary | ICD-10-CM | POA: Diagnosis not present

## 2018-02-18 DIAGNOSIS — R2681 Unsteadiness on feet: Secondary | ICD-10-CM | POA: Diagnosis not present

## 2018-02-18 DIAGNOSIS — F419 Anxiety disorder, unspecified: Secondary | ICD-10-CM | POA: Diagnosis not present

## 2018-02-18 DIAGNOSIS — Z9181 History of falling: Secondary | ICD-10-CM | POA: Diagnosis not present

## 2018-02-18 DIAGNOSIS — R413 Other amnesia: Secondary | ICD-10-CM | POA: Diagnosis not present

## 2018-02-18 DIAGNOSIS — Z7982 Long term (current) use of aspirin: Secondary | ICD-10-CM | POA: Diagnosis not present

## 2018-02-18 DIAGNOSIS — I119 Hypertensive heart disease without heart failure: Secondary | ICD-10-CM | POA: Diagnosis not present

## 2018-02-24 DIAGNOSIS — Z9181 History of falling: Secondary | ICD-10-CM | POA: Diagnosis not present

## 2018-02-24 DIAGNOSIS — F419 Anxiety disorder, unspecified: Secondary | ICD-10-CM | POA: Diagnosis not present

## 2018-02-24 DIAGNOSIS — R2681 Unsteadiness on feet: Secondary | ICD-10-CM | POA: Diagnosis not present

## 2018-02-24 DIAGNOSIS — I119 Hypertensive heart disease without heart failure: Secondary | ICD-10-CM | POA: Diagnosis not present

## 2018-02-24 DIAGNOSIS — R413 Other amnesia: Secondary | ICD-10-CM | POA: Diagnosis not present

## 2018-02-24 DIAGNOSIS — Z7982 Long term (current) use of aspirin: Secondary | ICD-10-CM | POA: Diagnosis not present

## 2018-02-26 DIAGNOSIS — R413 Other amnesia: Secondary | ICD-10-CM | POA: Diagnosis not present

## 2018-02-26 DIAGNOSIS — Z7982 Long term (current) use of aspirin: Secondary | ICD-10-CM | POA: Diagnosis not present

## 2018-02-26 DIAGNOSIS — F419 Anxiety disorder, unspecified: Secondary | ICD-10-CM | POA: Diagnosis not present

## 2018-02-26 DIAGNOSIS — I119 Hypertensive heart disease without heart failure: Secondary | ICD-10-CM | POA: Diagnosis not present

## 2018-02-26 DIAGNOSIS — Z9181 History of falling: Secondary | ICD-10-CM | POA: Diagnosis not present

## 2018-02-26 DIAGNOSIS — R2681 Unsteadiness on feet: Secondary | ICD-10-CM | POA: Diagnosis not present

## 2018-03-03 DIAGNOSIS — Z7982 Long term (current) use of aspirin: Secondary | ICD-10-CM | POA: Diagnosis not present

## 2018-03-03 DIAGNOSIS — I119 Hypertensive heart disease without heart failure: Secondary | ICD-10-CM | POA: Diagnosis not present

## 2018-03-03 DIAGNOSIS — R2681 Unsteadiness on feet: Secondary | ICD-10-CM | POA: Diagnosis not present

## 2018-03-03 DIAGNOSIS — F419 Anxiety disorder, unspecified: Secondary | ICD-10-CM | POA: Diagnosis not present

## 2018-03-03 DIAGNOSIS — Z9181 History of falling: Secondary | ICD-10-CM | POA: Diagnosis not present

## 2018-03-03 DIAGNOSIS — R413 Other amnesia: Secondary | ICD-10-CM | POA: Diagnosis not present

## 2018-03-04 DIAGNOSIS — R413 Other amnesia: Secondary | ICD-10-CM | POA: Diagnosis not present

## 2018-03-04 DIAGNOSIS — R2681 Unsteadiness on feet: Secondary | ICD-10-CM | POA: Diagnosis not present

## 2018-03-04 DIAGNOSIS — Z9181 History of falling: Secondary | ICD-10-CM | POA: Diagnosis not present

## 2018-03-04 DIAGNOSIS — Z7982 Long term (current) use of aspirin: Secondary | ICD-10-CM | POA: Diagnosis not present

## 2018-03-04 DIAGNOSIS — F419 Anxiety disorder, unspecified: Secondary | ICD-10-CM | POA: Diagnosis not present

## 2018-03-04 DIAGNOSIS — I119 Hypertensive heart disease without heart failure: Secondary | ICD-10-CM | POA: Diagnosis not present

## 2018-03-05 DIAGNOSIS — I10 Essential (primary) hypertension: Secondary | ICD-10-CM | POA: Diagnosis not present

## 2018-03-05 DIAGNOSIS — R739 Hyperglycemia, unspecified: Secondary | ICD-10-CM | POA: Diagnosis not present

## 2018-03-05 DIAGNOSIS — F419 Anxiety disorder, unspecified: Secondary | ICD-10-CM | POA: Diagnosis not present

## 2018-03-05 DIAGNOSIS — G309 Alzheimer's disease, unspecified: Secondary | ICD-10-CM | POA: Diagnosis not present

## 2018-03-10 DIAGNOSIS — F419 Anxiety disorder, unspecified: Secondary | ICD-10-CM | POA: Diagnosis not present

## 2018-03-10 DIAGNOSIS — Z7982 Long term (current) use of aspirin: Secondary | ICD-10-CM | POA: Diagnosis not present

## 2018-03-10 DIAGNOSIS — I119 Hypertensive heart disease without heart failure: Secondary | ICD-10-CM | POA: Diagnosis not present

## 2018-03-10 DIAGNOSIS — R413 Other amnesia: Secondary | ICD-10-CM | POA: Diagnosis not present

## 2018-03-10 DIAGNOSIS — Z9181 History of falling: Secondary | ICD-10-CM | POA: Diagnosis not present

## 2018-03-10 DIAGNOSIS — R2681 Unsteadiness on feet: Secondary | ICD-10-CM | POA: Diagnosis not present

## 2018-03-12 DIAGNOSIS — Z7982 Long term (current) use of aspirin: Secondary | ICD-10-CM | POA: Diagnosis not present

## 2018-03-12 DIAGNOSIS — I119 Hypertensive heart disease without heart failure: Secondary | ICD-10-CM | POA: Diagnosis not present

## 2018-03-12 DIAGNOSIS — F419 Anxiety disorder, unspecified: Secondary | ICD-10-CM | POA: Diagnosis not present

## 2018-03-12 DIAGNOSIS — Z9181 History of falling: Secondary | ICD-10-CM | POA: Diagnosis not present

## 2018-03-12 DIAGNOSIS — R2681 Unsteadiness on feet: Secondary | ICD-10-CM | POA: Diagnosis not present

## 2018-03-12 DIAGNOSIS — R413 Other amnesia: Secondary | ICD-10-CM | POA: Diagnosis not present

## 2018-03-17 DIAGNOSIS — I119 Hypertensive heart disease without heart failure: Secondary | ICD-10-CM | POA: Diagnosis not present

## 2018-03-17 DIAGNOSIS — F419 Anxiety disorder, unspecified: Secondary | ICD-10-CM | POA: Diagnosis not present

## 2018-03-17 DIAGNOSIS — Z9181 History of falling: Secondary | ICD-10-CM | POA: Diagnosis not present

## 2018-03-17 DIAGNOSIS — R2681 Unsteadiness on feet: Secondary | ICD-10-CM | POA: Diagnosis not present

## 2018-03-17 DIAGNOSIS — R413 Other amnesia: Secondary | ICD-10-CM | POA: Diagnosis not present

## 2018-03-17 DIAGNOSIS — Z7982 Long term (current) use of aspirin: Secondary | ICD-10-CM | POA: Diagnosis not present
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
## 2018-03-19 ENCOUNTER — Ambulatory Visit (INDEPENDENT_AMBULATORY_CARE_PROVIDER_SITE_OTHER): Payer: Medicare Other | Admitting: Psychology

## 2018-03-19 ENCOUNTER — Encounter: Payer: Self-pay | Admitting: Psychology

## 2018-03-19 DIAGNOSIS — F0390 Unspecified dementia without behavioral disturbance: Secondary | ICD-10-CM

## 2018-03-25 ENCOUNTER — Telehealth: Payer: Self-pay | Admitting: Neurology

## 2018-03-25 ENCOUNTER — Other Ambulatory Visit: Payer: Self-pay | Admitting: Neurology

## 2018-03-25 DIAGNOSIS — L570 Actinic keratosis: Secondary | ICD-10-CM | POA: Diagnosis not present

## 2018-03-25 DIAGNOSIS — L218 Other seborrheic dermatitis: Secondary | ICD-10-CM | POA: Diagnosis not present

## 2018-03-25 DIAGNOSIS — L821 Other seborrheic keratosis: Secondary | ICD-10-CM | POA: Diagnosis not present

## 2018-03-25 DIAGNOSIS — F0390 Unspecified dementia without behavioral disturbance: Secondary | ICD-10-CM

## 2018-03-25 DIAGNOSIS — L814 Other melanin hyperpigmentation: Secondary | ICD-10-CM | POA: Diagnosis not present

## 2018-03-25 NOTE — Telephone Encounter (Signed)
Thanks Hinton Dyer, appreciate it. I'm so sorry but can you also get patient set up with LP that I ordered? Thanks.  [name], all labs and LP ordered. They are extensive labs and urine is also needed. Pleas einform them that he should come well hydrated. He can come to our lab anytime. LP will be scheduled. thanks

## 2018-03-25 NOTE — Telephone Encounter (Signed)
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 OWuld you call [facility] and see if we can get him in sooner?  [name], I am going to order a lumbar puncture today and also extensive labwork. He can come tomorrow for labwork, LP will be scheduled. Please let daughters know thanks.

## 2018-03-25 NOTE — Telephone Encounter (Signed)
Called and left patient's wife a message please give her husband plenty of water and bring him to [facility] for Labs relayed hours . I will call her again in the am.

## 2018-03-25 NOTE — Telephone Encounter (Signed)
Call wife with appointments [phone] directly and labs and LP. Please call and talk to wife about appointments. Give her the LP date and also ask them to come into the office for extensive labwork (please hydrate well we will need a lot of blood). Can we ensure this is the primary number on the chart please? Daughters are secondary. Discussed LP and labs with daughter and she says we need to call wife with appointment. Wife is aware of LP.

## 2018-03-25 NOTE — Telephone Encounter (Signed)
I have called [facility] for a sooner apt for patient . Patient is scheduled for 05/29/2018 at 9:00 am with Dr. Jimmye Norman . Patient's chart has been pulled for review to see if they can get him in sooner. [facility] will call me and daughters if this can happen . I have also called and left the daughter's a message about status so they will be looking out for the telephone call.    slusher, laura Daughter   [phone]  Tedra Coupe Daughter   [phone]

## 2018-03-25 NOTE — Telephone Encounter (Signed)
Order has been sent for LP I have told roberta to contact daughter's .

## 2018-03-25 NOTE — Telephone Encounter (Signed)
Patient's daughter  slusher, laura Daughter   [phone]   Asking  for a telephone call back about Lp why is he having ? Mickel Baas stated you can call her 03/26/2018 8:00am - 9:15 am. I told Mickel Baas would would do our best to try and make this happen. For her thanks Hinton Dyer  .

## 2018-03-26 ENCOUNTER — Other Ambulatory Visit (INDEPENDENT_AMBULATORY_CARE_PROVIDER_SITE_OTHER): Payer: Self-pay

## 2018-03-26 DIAGNOSIS — Z0289 Encounter for other administrative examinations: Secondary | ICD-10-CM

## 2018-03-26 DIAGNOSIS — F0390 Unspecified dementia without behavioral disturbance: Secondary | ICD-10-CM

## 2018-03-26 NOTE — Telephone Encounter (Signed)
Patient will be Here today for blood work . Charles [name] has called patient's wife and left her a message to call and schedule LP she will get that done today.

## 2018-03-27 ENCOUNTER — Ambulatory Visit
Admission: RE | Admit: 2018-03-27 | Discharge: 2018-03-27 | Disposition: A | Discharge status: Home / Self Care | Payer: Medicare Other | Source: Ambulatory Visit | Attending: Neurology | Admitting: Neurology

## 2018-03-27 ENCOUNTER — Other Ambulatory Visit (HOSPITAL_COMMUNITY)
Admission: RE | Admit: 2018-03-27 | Discharge: 2018-03-27 | Disposition: A | Discharge status: Home / Self Care | Payer: Medicare Other | Source: Ambulatory Visit | Attending: Neurology | Admitting: Neurology

## 2018-03-27 VITALS — BP 119/45 | HR 57

## 2018-03-27 DIAGNOSIS — F05 Delirium due to known physiological condition: Secondary | ICD-10-CM

## 2018-03-27 DIAGNOSIS — F0390 Unspecified dementia without behavioral disturbance: Secondary | ICD-10-CM

## 2018-03-27 DIAGNOSIS — F0391 Unspecified dementia with behavioral disturbance: Secondary | ICD-10-CM | POA: Diagnosis not present

## 2018-03-27 DIAGNOSIS — R410 Disorientation, unspecified: Secondary | ICD-10-CM

## 2018-03-27 LAB — HEAVY METALS PROFILE, URINE

## 2018-03-27 LAB — TOXASSURE SELECT 13 (MW), URINE

## 2018-03-27 NOTE — Progress Notes (Signed)
Blood obtained from pt's L AC for lab work. Pt tolerated procedure well. Site is unremarkable.

## 2018-03-27 NOTE — Discharge Instructions (Signed)

## 2018-03-30 ENCOUNTER — Other Ambulatory Visit: Payer: Self-pay | Admitting: Neurology

## 2018-03-30 DIAGNOSIS — Z0389 Encounter for observation for other suspected diseases and conditions ruled out: Secondary | ICD-10-CM | POA: Diagnosis not present

## 2018-03-30 DIAGNOSIS — Z1389 Encounter for screening for other disorder: Secondary | ICD-10-CM | POA: Diagnosis not present

## 2018-03-30 DIAGNOSIS — F0390 Unspecified dementia without behavioral disturbance: Secondary | ICD-10-CM | POA: Diagnosis not present

## 2018-03-30 LAB — CBC
HEMOGLOBIN: 13 g/dL (ref 13.0–17.7)
Hematocrit: 39.7 % (ref 37.5–51.0)
MCH: 31.3 pg (ref 26.6–33.0)
MCHC: 32.7 g/dL (ref 31.5–35.7)
MCV: 95 fL (ref 79–97)
Platelets: 190 10*3/uL (ref 150–450)
RBC: 4.16 x10E6/uL (ref 4.14–5.80)
RDW: 13.2 % (ref 12.3–15.4)
WBC: 6.2 10*3/uL (ref 3.4–10.8)

## 2018-03-30 LAB — MULTIPLE MYELOMA PANEL, SERUM
ALBUMIN SERPL ELPH-MCNC: 3.8 g/dL (ref 2.9–4.4)
ALPHA 1: 0.2 g/dL (ref 0.0–0.4)
Albumin/Glob SerPl: 1.6 (ref 0.7–1.7)
Alpha2 Glob SerPl Elph-Mcnc: 0.6 g/dL (ref 0.4–1.0)
B-Globulin SerPl Elph-Mcnc: 0.8 g/dL (ref 0.7–1.3)
Gamma Glob SerPl Elph-Mcnc: 0.8 g/dL (ref 0.4–1.8)
Globulin, Total: 2.5 g/dL (ref 2.2–3.9)
IGA/IMMUNOGLOBULIN A, SERUM: 192 mg/dL (ref 61–437)
IgG (Immunoglobin G), Serum: 892 mg/dL (ref 700–1600)
IgM (Immunoglobulin M), Srm: 59 mg/dL (ref 15–143)

## 2018-03-30 LAB — COMPREHENSIVE METABOLIC PANEL
A/G RATIO: 2 (ref 1.2–2.2)
ALT: 54 IU/L — AB (ref 0–44)
AST: 43 IU/L — ABNORMAL HIGH (ref 0–40)
Albumin: 4.2 g/dL (ref 3.5–4.8)
Alkaline Phosphatase: 71 IU/L (ref 39–117)
BUN/Creatinine Ratio: 12 (ref 10–24)
BUN: 13 mg/dL (ref 8–27)
Bilirubin Total: 1.3 mg/dL — ABNORMAL HIGH (ref 0.0–1.2)
CALCIUM: 9.8 mg/dL (ref 8.6–10.2)
CO2: 30 mmol/L — AB (ref 20–29)
CREATININE: 1.1 mg/dL (ref 0.76–1.27)
Chloride: 96 mmol/L (ref 96–106)
GFR calc Af Amer: 75 mL/min/{1.73_m2} (ref >59)
GFR, EST NON AFRICAN AMERICAN: 65 mL/min/{1.73_m2} (ref >59)
Globulin, Total: 2.1 g/dL (ref 1.5–4.5)
Glucose: 91 mg/dL (ref 65–99)
Potassium: 4.6 mmol/L (ref 3.5–5.2)
Sodium: 138 mmol/L (ref 134–144)
TOTAL PROTEIN: 6.3 g/dL (ref 6.0–8.5)

## 2018-03-30 LAB — SEDIMENTATION RATE: Sed Rate: 2 mm/hr (ref 0–30)

## 2018-03-30 LAB — URINALYSIS, ROUTINE W REFLEX MICROSCOPIC
BILIRUBIN UA: NEGATIVE
Glucose, UA: NEGATIVE
KETONES UA: NEGATIVE
Leukocytes, UA: NEGATIVE
NITRITE UA: NEGATIVE
PROTEIN UA: NEGATIVE
RBC, UA: NEGATIVE
SPEC GRAV UA: 1.008 (ref 1.005–1.030)
UUROB: 0.2 mg/dL (ref 0.2–1.0)
pH, UA: 7 (ref 5.0–7.5)

## 2018-03-30 LAB — C-REACTIVE PROTEIN: CRP: <1 mg/L (ref 0–10)

## 2018-03-30 LAB — T4, FREE: FREE T4: 1.03 ng/dL (ref 0.82–1.77)

## 2018-03-30 LAB — HIV ANTIBODY (ROUTINE TESTING W REFLEX): HIV SCREEN 4TH GENERATION: NONREACTIVE

## 2018-03-30 LAB — TSH: TSH: 2.53 u[IU]/mL (ref 0.450–4.500)

## 2018-03-30 LAB — FANA STAINING PATTERNS: Speckled Pattern: 1:80 {titer}

## 2018-03-30 LAB — VITAMIN B6: Vitamin B6: 16.2 ug/L (ref 5.3–46.7)

## 2018-03-30 LAB — PHOSPHORUS: PHOSPHORUS: 3.5 mg/dL (ref 2.5–4.5)

## 2018-03-30 LAB — THYROGLOBULIN ANTIBODY

## 2018-03-30 LAB — RPR: RPR Ser Ql: NONREACTIVE

## 2018-03-30 LAB — PTH, INTACT AND CALCIUM: PTH: 16 pg/mL (ref 15–65)

## 2018-03-30 LAB — MAGNESIUM: MAGNESIUM: 2.2 mg/dL (ref 1.6–2.3)

## 2018-03-30 LAB — ANTINUCLEAR ANTIBODIES, IFA: ANA TITER 1: POSITIVE — AB

## 2018-03-30 LAB — B. BURGDORFI ANTIBODIES

## 2018-03-30 LAB — VITAMIN B1: THIAMINE: 128.9 nmol/L (ref 66.5–200.0)

## 2018-03-30 LAB — THYROID PEROXIDASE ANTIBODY: Thyroperoxidase Ab SerPl-aCnc: <6 IU/mL (ref 0–34)

## 2018-03-30 NOTE — Addendum Note (Signed)
Addended by: Inis Sizer D on: 03/30/2018 02:02 PM   Modules accepted: Orders

## 2018-03-31 ENCOUNTER — Telehealth: Payer: Self-pay | Admitting: Neurology

## 2018-03-31 NOTE — Telephone Encounter (Signed)
Called the pt daughter laura back and informed her that the patient labs were all WNL except the pending labs that had not came back at this time. I informed the LFT's were slightly elevated but that Dr Jaynee Eagles was not concerned. The patient's daughter states that she understands but there had ben conversations of the patient coming to follow up prior to being seen at [facility]. There was a cancellation on 7/8 at 8:30 am and I have placed on hold for this patient.  The pt daughter Mickel Baas states that she is fine with him not coming and she thinks the pt's wife is fine with not being seen but the other sister Lattie Haw is concerned and thinking they should be seen. She will contact the sister and in the event she wants to speak to me she will call back and they will either call back and confirm to either keep or delete the apt that is on hold for Monday 7/8.

## 2018-03-31 NOTE — Telephone Encounter (Signed)
Pt's daughter Janalee Dane) [phone] (c) , sister Lattie Haw and wife and pt are wanting to come in for an appt to talk about the lab results. They are all concerned. She is aware Dr Jaynee Eagles schedule is booked. Is there anywhere on the schedule this patient can be seen?? Best times to reach her are before 9:30 and during lunch 12:30 - 1:30.

## 2018-03-31 NOTE — Telephone Encounter (Signed)
Called and spoke with the pt's wife informing her of the results. I have sent the lab results via fax to the patient't PCP and the patient has an apt coming up in sept there and they will discuss at the visit. Informed her of the LP results that have came through and that there were still labs pending from that but so far it was normal. The patient is informed that we will call with the future results if they are abnormal. Pt's wife verbalized understanding.

## 2018-03-31 NOTE — Telephone Encounter (Signed)
-----   Message from Melvenia Beam, MD sent at 03/30/2018  5:22 PM EDT ----- Labs have come back unremarkable, nothing significant. He does have slightly elevated liver function tests and he should f/u with primary care to evaluate that but otherwise everything was essentially normal.  (ANA was false positive, titer too low and the reflex was all negative). Still pengin urine labs as well as lumbar puncture results thanks

## 2018-04-01 NOTE — Telephone Encounter (Signed)
Charles [name] called back and accepted 7/8 @ 8:30, ck in 8am.

## 2018-04-04 LAB — HEAVY METALS PROFILE, URINE
ARSENIC 24H UR: 17 ug/(24.h) (ref 0–50)
ARSENIC(INORGANIC), U: NOT DETECTED ug/L (ref 0–19)
Arsenic Ur: 15 ug/L (ref 0–50)
CREATININE(CRT), U: 0.62 g/L (ref 0.30–3.00)
Lead, Rand Ur: NOT DETECTED ug/L (ref 0–49)
Mercury, Ur: NOT DETECTED ug/L (ref 0–19)

## 2018-04-04 LAB — TOXASSURE SELECT 13 (MW), URINE

## 2018-04-06 ENCOUNTER — Telehealth: Payer: Self-pay | Admitting: *Deleted

## 2018-04-06 ENCOUNTER — Ambulatory Visit: Payer: Medicare Other | Admitting: Neurology

## 2018-04-06 NOTE — Telephone Encounter (Signed)
Called pt/wife and LVM (ok per DPR) informing them that MD not in office this morning and will need to cancel appt. Will call back to r/s pt asap. Apologized for inconvenience. Tried to call pt's daughters Mickel Baas & Lattie Haw but was unable to reach them or leave messages.

## 2018-04-06 NOTE — Telephone Encounter (Signed)
Spoke with pt's wife Tana Conch. Gave her several appt options for this Friday 7/12. She was unsure the daughters would be able to come so she took an appt for 7/19 Friday @ 10:30 but will call back asap if she can r/s to this Friday if family able to come.

## 2018-04-06 NOTE — Telephone Encounter (Addendum)
Pt's wife Tana Conch (on [place]) returned call. She was able to confirm the daughters can come this Friday 7/12 @ 11:30. Pt was scheduled for that appt and the 7/19 appt was canceled per wife's agreement.

## 2018-04-10 ENCOUNTER — Encounter: Payer: Self-pay | Admitting: Neurology

## 2018-04-10 ENCOUNTER — Ambulatory Visit (INDEPENDENT_AMBULATORY_CARE_PROVIDER_SITE_OTHER): Payer: Medicare Other | Admitting: Neurology

## 2018-04-10 VITALS — BP 128/62 | HR 48 | Ht 69.0 in | Wt 115.0 lb

## 2018-04-10 DIAGNOSIS — R627 Adult failure to thrive: Secondary | ICD-10-CM | POA: Diagnosis not present

## 2018-04-10 DIAGNOSIS — C801 Malignant (primary) neoplasm, unspecified: Secondary | ICD-10-CM | POA: Diagnosis not present

## 2018-04-10 DIAGNOSIS — A8100 Creutzfeldt-Jakob disease, unspecified: Secondary | ICD-10-CM | POA: Diagnosis not present

## 2018-04-10 DIAGNOSIS — I776 Arteritis, unspecified: Secondary | ICD-10-CM | POA: Diagnosis not present

## 2018-04-10 DIAGNOSIS — G9340 Encephalopathy, unspecified: Secondary | ICD-10-CM | POA: Diagnosis not present

## 2018-04-10 DIAGNOSIS — F0390 Unspecified dementia without behavioral disturbance: Secondary | ICD-10-CM

## 2018-04-10 DIAGNOSIS — G988 Other disorders of nervous system: Secondary | ICD-10-CM

## 2018-04-10 DIAGNOSIS — R4189 Other symptoms and signs involving cognitive functions and awareness: Secondary | ICD-10-CM

## 2018-04-10 DIAGNOSIS — R41841 Cognitive communication deficit: Secondary | ICD-10-CM
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
## 2018-04-13 ENCOUNTER — Telehealth: Payer: Self-pay | Admitting: Neurology

## 2018-04-13 ENCOUNTER — Telehealth: Payer: Self-pay | Admitting: *Deleted

## 2018-04-13 DIAGNOSIS — F0390 Unspecified dementia without behavioral disturbance: Secondary | ICD-10-CM | POA: Insufficient documentation

## 2018-04-13 MED ORDER — SODIUM CHLORIDE 0.9 % IV SOLN: 1000.0000 mg | Freq: Every day | INTRAVENOUS | Status: DC

## 2018-04-13 NOTE — Telephone Encounter (Signed)
Pt's wife returned Rn's call. They just got back from being out of town and is not able to bring him tomorrow. It will need to be later in the day also

## 2018-04-13 NOTE — Telephone Encounter (Signed)
Medicare/BCBS supp order sent to [facility]. No auth they will reach out to the pt to schedule.  °

## 2018-04-13 NOTE — Telephone Encounter (Addendum)
Called pt/wife [name] @ home and LVM (ok per DPR) asking for call back to let us know if Charles [name] is able to come tomorrow Tues 7/16 around 9:30/10 AM for his first steroid infusion. Left office number in message.   Infusion order form ready for MD signature for Solumedrol 1 gram IV daily x 5 days. Verbal order written per Dr Jaynee Eagles.

## 2018-04-14 ENCOUNTER — Other Ambulatory Visit: Payer: Self-pay | Admitting: Neurology

## 2018-04-14 DIAGNOSIS — F0390 Unspecified dementia without behavioral disturbance: Secondary | ICD-10-CM

## 2018-04-14 DIAGNOSIS — Z9181 History of falling: Secondary | ICD-10-CM

## 2018-04-14 DIAGNOSIS — R29818 Other symptoms and signs involving the nervous system: Secondary | ICD-10-CM

## 2018-04-14 DIAGNOSIS — R627 Adult failure to thrive: Secondary | ICD-10-CM

## 2018-04-14 NOTE — Telephone Encounter (Signed)
Spoke with pt's daughter Mickel Baas. Informed her of what intrafusion said and gave her the number for the financial dept. Advised her that our intrafusion group here does have any part in the billing and she would need to have the codes which she potentially get from the # given for solumedrol 1 gram IV daily x 5 days. She was appreciative and said she would call. She stated that they would not be scheduling the steroid infusion until they get more financial information. RN kindly asked for them to call us as soon as they know so we can get the patient on the schedule. She verbalized understanding and appreciation.

## 2018-04-14 NOTE — Telephone Encounter (Signed)
Spoke with intrafusion. The insurance will have to be billed after the infusion to know exactly what the patient will be responsible for. They can try to call [phone] but she was unsure if it will help.

## 2018-04-14 NOTE — Telephone Encounter (Signed)
Pt's daughter Laura/DPR called back to advised he not sure if he wants to have steroid treatment yet and how much will covered on the infusion? So at this point the appointment needs to be cancelled 2) how much will be covered on PET scan-she is wanting to know if the whole body will covered and if not is that due to the recent one he had 3) she said her mother's sister is going thru the same thing as her father and is very overwhelmed at this time.  Mickel Baas is asking if calls could be directed to her at this time until things calm down. She can be reached at [phone] and can LVM if she does not answer

## 2018-04-14 NOTE — Telephone Encounter (Signed)
I have called and spoke to Charles [name] patient's daughter as requested. Charles [name] is asking if calls could be directed to her at this time please leave her a details message if she does not answer.  Dr. Jaynee Eagles PET Amyloid has been approved I have a reference from medicare and [place] - 04/14/2018 called Medicare and checked for Auth CPT code for PET Scan Brain Amyloid Medicare Reff # [id number].  PET Scan [place] Amyloid - Reff# [id number] - CPT -[id number].  Patient is good to go. Charles [name] I have talked  to patient's  Daughter. PET Scan Amyloid is scheduled for 04/23/2018.  PET scan whole Body will not be covered checked with medicare or  blue cross both relayed no . Patient has  had to have Dx of Melanoma or myeloma . So I relayed to daughter that this will go back to PCP form him   To order.   Dr. Jaynee Eagles - Nutrition Please  order for home Health for Nursing/ Nutrition . I have spoke to [name] from [facility] and this service they can provide this week.   [facility] has been contacted [facility] telephone [phone]- fax [phone]-

## 2018-04-14 NOTE — Telephone Encounter (Addendum)
Charles [name] , I am working on [employer] and referral's I spoke to [facility] yesterday.   When I here from insurance on PET scan's I will call daughter asap.

## 2018-04-14 NOTE — Telephone Encounter (Signed)
Called pt & LVM (ok per DPR) informing pt/wife that we received their message about not being able to come today for steroid infusion. We have availability daily that RN would like to discuss with them starting tomorrow 7/17 @ 2 PM. Left office number and asked for call back to discuss these appt times.

## 2018-04-14 NOTE — Telephone Encounter (Signed)
Referral to home health ordered thanks

## 2018-04-15 DIAGNOSIS — H5203 Hypermetropia, bilateral: Secondary | ICD-10-CM | POA: Diagnosis not present

## 2018-04-15 DIAGNOSIS — Z961 Presence of intraocular lens: Secondary | ICD-10-CM | POA: Diagnosis not present

## 2018-04-15 DIAGNOSIS — H52223 Regular astigmatism, bilateral: Secondary | ICD-10-CM | POA: Diagnosis not present

## 2018-04-16 DIAGNOSIS — F0280 Dementia in other diseases classified elsewhere without behavioral disturbance: Secondary | ICD-10-CM | POA: Diagnosis not present

## 2018-04-16 DIAGNOSIS — I10 Essential (primary) hypertension: Secondary | ICD-10-CM | POA: Diagnosis not present

## 2018-04-16 DIAGNOSIS — R627 Adult failure to thrive: Secondary | ICD-10-CM | POA: Diagnosis not present

## 2018-04-16 DIAGNOSIS — A8100 Creutzfeldt-Jakob disease, unspecified: Secondary | ICD-10-CM | POA: Diagnosis not present

## 2018-04-16 DIAGNOSIS — R29818 Other symptoms and signs involving the nervous system: Secondary | ICD-10-CM | POA: Diagnosis not present

## 2018-04-16 DIAGNOSIS — I700 Atherosclerosis of aorta: Secondary | ICD-10-CM | POA: Diagnosis not present

## 2018-04-17 ENCOUNTER — Ambulatory Visit: Payer: Self-pay | Admitting: Neurology

## 2018-04-17 DIAGNOSIS — F0280 Dementia in other diseases classified elsewhere without behavioral disturbance: Secondary | ICD-10-CM | POA: Diagnosis not present

## 2018-04-17 DIAGNOSIS — A8100 Creutzfeldt-Jakob disease, unspecified: Secondary | ICD-10-CM | POA: Diagnosis not present

## 2018-04-17 DIAGNOSIS — I700 Atherosclerosis of aorta: Secondary | ICD-10-CM | POA: Diagnosis not present

## 2018-04-17 DIAGNOSIS — I10 Essential (primary) hypertension: Secondary | ICD-10-CM | POA: Diagnosis not present

## 2018-04-17 DIAGNOSIS — R627 Adult failure to thrive: Secondary | ICD-10-CM | POA: Diagnosis not present

## 2018-04-17 DIAGNOSIS — R29818 Other symptoms and signs involving the nervous system: Secondary | ICD-10-CM | POA: Diagnosis not present

## 2018-04-21 DIAGNOSIS — I700 Atherosclerosis of aorta: Secondary | ICD-10-CM | POA: Diagnosis not present

## 2018-04-21 DIAGNOSIS — R627 Adult failure to thrive: Secondary | ICD-10-CM | POA: Diagnosis not present

## 2018-04-21 DIAGNOSIS — A8100 Creutzfeldt-Jakob disease, unspecified: Secondary | ICD-10-CM | POA: Diagnosis not present

## 2018-04-21 DIAGNOSIS — F0280 Dementia in other diseases classified elsewhere without behavioral disturbance: Secondary | ICD-10-CM | POA: Diagnosis not present

## 2018-04-21 DIAGNOSIS — R29818 Other symptoms and signs involving the nervous system: Secondary | ICD-10-CM | POA: Diagnosis not present

## 2018-04-21 DIAGNOSIS — I10 Essential (primary) hypertension: Secondary | ICD-10-CM | POA: Diagnosis not present

## 2018-04-22 DIAGNOSIS — F0280 Dementia in other diseases classified elsewhere without behavioral disturbance: Secondary | ICD-10-CM | POA: Diagnosis not present

## 2018-04-22 DIAGNOSIS — R627 Adult failure to thrive: Secondary | ICD-10-CM | POA: Diagnosis not present

## 2018-04-22 DIAGNOSIS — A8100 Creutzfeldt-Jakob disease, unspecified: Secondary | ICD-10-CM | POA: Diagnosis not present

## 2018-04-22 DIAGNOSIS — I10 Essential (primary) hypertension: Secondary | ICD-10-CM | POA: Diagnosis not present

## 2018-04-22 DIAGNOSIS — R29818 Other symptoms and signs involving the nervous system: Secondary | ICD-10-CM | POA: Diagnosis not present

## 2018-04-22 DIAGNOSIS — I700 Atherosclerosis of aorta: Secondary | ICD-10-CM | POA: Diagnosis not present

## 2018-04-22 NOTE — Telephone Encounter (Signed)
Called Charles [name] back and left her a message and told her to call me back on my direct line [phone].

## 2018-04-22 NOTE — Telephone Encounter (Addendum)
Pts daughter Lattie Haw [phone] requesting to speak with Hinton Dyer. Did not wish to discuss with me.

## 2018-04-22 NOTE — Telephone Encounter (Signed)
Pt daughter Lisa(on DPR) has called Hinton Dyer back and she says she will keep her phone near by. Please call at any time

## 2018-04-23 ENCOUNTER — Encounter (HOSPITAL_COMMUNITY)
Admission: RE | Admit: 2018-04-23 | Discharge: 2018-04-23 | Disposition: A | Discharge status: Home / Self Care | Payer: Medicare Other | Source: Ambulatory Visit | Attending: Neurology | Admitting: Neurology

## 2018-04-23 DIAGNOSIS — R4189 Other symptoms and signs involving cognitive functions and awareness: Secondary | ICD-10-CM

## 2018-04-23 DIAGNOSIS — R627 Adult failure to thrive: Secondary | ICD-10-CM | POA: Diagnosis not present

## 2018-04-23 DIAGNOSIS — R41841 Cognitive communication deficit: Secondary | ICD-10-CM | POA: Insufficient documentation

## 2018-04-23 DIAGNOSIS — F0390 Unspecified dementia without behavioral disturbance: Secondary | ICD-10-CM | POA: Diagnosis not present

## 2018-04-23 DIAGNOSIS — G9340 Encephalopathy, unspecified: Secondary | ICD-10-CM | POA: Diagnosis not present

## 2018-04-23 NOTE — Telephone Encounter (Signed)
Spoke to patient's daughter about 30 minutes about [facility] apt. Relayed to her all records and test had been faxed I relayed to daughter to get a copy of MRI cd to take as well . Faxed [facility] notes .

## 2018-04-24 DIAGNOSIS — F809 Developmental disorder of speech and language, unspecified: Secondary | ICD-10-CM | POA: Diagnosis not present

## 2018-04-24 DIAGNOSIS — R453 Demoralization and apathy: Secondary | ICD-10-CM | POA: Diagnosis not present

## 2018-04-24 DIAGNOSIS — F0281 Dementia in other diseases classified elsewhere with behavioral disturbance: Secondary | ICD-10-CM | POA: Insufficient documentation

## 2018-04-24 DIAGNOSIS — F0280 Dementia in other diseases classified elsewhere without behavioral disturbance: Secondary | ICD-10-CM | POA: Insufficient documentation

## 2018-04-24 DIAGNOSIS — F0390 Unspecified dementia without behavioral disturbance: Secondary | ICD-10-CM | POA: Diagnosis not present

## 2018-04-24 DIAGNOSIS — R498 Other voice and resonance disorders: Secondary | ICD-10-CM | POA: Diagnosis not present

## 2018-04-24 DIAGNOSIS — R499 Unspecified voice and resonance disorder: Secondary | ICD-10-CM | POA: Diagnosis not present

## 2018-04-24 DIAGNOSIS — G3183 Dementia with Lewy bodies: Secondary | ICD-10-CM

## 2018-04-24 DIAGNOSIS — R432 Parageusia: Secondary | ICD-10-CM | POA: Diagnosis not present

## 2018-04-24 DIAGNOSIS — F419 Anxiety disorder, unspecified: Secondary | ICD-10-CM | POA: Diagnosis not present

## 2018-04-27 ENCOUNTER — Telehealth: Payer: Self-pay | Admitting: Neurology

## 2018-04-27 LAB — CSF CULTURE W GRAM STAIN: MICRO NUMBER:: 90774434

## 2018-04-27 LAB — TROPHERYMA WHIPPLEI, DNA, PCR: T. whipplei: NOT DETECTED

## 2018-04-27 LAB — FUNGUS CULTURE W SMEAR
MICRO NUMBER: 90774433
SMEAR:: NONE SEEN
SPECIMEN QUALITY:: ADEQUATE

## 2018-04-27 LAB — LEUKEMIA/LYMPHOMA EVALUATION PANEL

## 2018-04-27 LAB — CSF CELL COUNT WITH DIFFERENTIAL
RBC COUNT CSF: 9 {cells}/uL (ref 0–10)
WBC CSF: 1 {cells}/uL (ref 0–5)

## 2018-04-27 LAB — CRYPTOCOCCAL AG, LTX SCR RFLX TITER: Cryptococcal Ag Screen: NOT DETECTED

## 2018-04-27 LAB — CNS IGG SYNTHESIS RATE, CSF+BLOOD
ALBUMIN SERUM: 4.3 g/dL (ref 3.2–4.6)
ALBUMIN,CSF: 7.9 mg/dL — AB (ref 8.0–42.0)
CNS-IGG SYNTHESIS RATE: -3.9 mg/(24.h) (ref <=3.3)
IGG TOTAL CSF: 0.7 mg/dL — AB (ref 0.8–7.7)
IGG-INDEX: 0.42 (ref <0.66)
IgG (Immunoglobin G), Serum: 903 mg/dL (ref 694–1618)

## 2018-04-27 LAB — NEURON SPECIFIC ENOLASE (NSE), CSF: Neuron Specific Enolase (NSE), CSF: 24.5 ng/mL — ABNORMAL HIGH (ref <8.9)

## 2018-04-27 LAB — B. BURGDORFI ANTIBODIES, CSF: LYME AB: NEGATIVE

## 2018-04-27 LAB — EPSTEIN BARR VIRUS DNA, QUANT RTPCR

## 2018-04-27 LAB — CSF CULTURE
GRAM STAIN: NONE SEEN
RESULT: NO GROWTH
SPECIMEN QUALITY: ADEQUATE

## 2018-04-27 LAB — PROTEIN, CSF: TOTAL PROTEIN, CSF: 22 mg/dL (ref 15–60)

## 2018-04-27 LAB — OLIGOCLONAL BANDS, CSF + SERM

## 2018-04-27 LAB — GLUCOSE, CSF: Glucose, CSF: 56 mg/dL (ref 40–80)

## 2018-04-27 LAB — VDRL, CSF: VDRL Quant, CSF: NONREACTIVE

## 2018-04-27 LAB — BETA-2-MICROGLOBULIN, CSF: BETA-2-MICROGLOBULIN, CSF: 1.2 mg/L (ref 0.36–2.56)

## 2018-04-27 NOTE — Telephone Encounter (Signed)
I discussed with Dr. Rondel Oh today, thanks

## 2018-04-27 NOTE — Telephone Encounter (Signed)
FYI Pt wife (on DPR-[name] [phone] has called to inform that Dr Clarene Critchley Jeanmarie Plant ) asked that the scans at GI be cancelled and he will order other test on his own.  GI asked pt wife to call to make Dr Jaynee Eagles aware that the scans were cancelled.  No call back requested.

## 2018-04-28 DIAGNOSIS — F0280 Dementia in other diseases classified elsewhere without behavioral disturbance: Secondary | ICD-10-CM | POA: Diagnosis not present

## 2018-04-28 DIAGNOSIS — I10 Essential (primary) hypertension: Secondary | ICD-10-CM | POA: Diagnosis not present

## 2018-04-28 DIAGNOSIS — R29818 Other symptoms and signs involving the nervous system: Secondary | ICD-10-CM | POA: Diagnosis not present

## 2018-04-28 DIAGNOSIS — A8100 Creutzfeldt-Jakob disease, unspecified: Secondary | ICD-10-CM | POA: Diagnosis not present

## 2018-04-28 DIAGNOSIS — R627 Adult failure to thrive: Secondary | ICD-10-CM | POA: Diagnosis not present

## 2018-04-28 DIAGNOSIS — I700 Atherosclerosis of aorta: Secondary | ICD-10-CM | POA: Diagnosis not present

## 2018-04-29 DIAGNOSIS — R29818 Other symptoms and signs involving the nervous system: Secondary | ICD-10-CM | POA: Diagnosis not present

## 2018-04-29 DIAGNOSIS — F0280 Dementia in other diseases classified elsewhere without behavioral disturbance: Secondary | ICD-10-CM | POA: Diagnosis not present

## 2018-04-29 DIAGNOSIS — R627 Adult failure to thrive: Secondary | ICD-10-CM | POA: Diagnosis not present

## 2018-04-29 DIAGNOSIS — A8100 Creutzfeldt-Jakob disease, unspecified: Secondary | ICD-10-CM | POA: Diagnosis not present

## 2018-04-29 DIAGNOSIS — I700 Atherosclerosis of aorta: Secondary | ICD-10-CM | POA: Diagnosis not present

## 2018-04-29 DIAGNOSIS — I10 Essential (primary) hypertension: Secondary | ICD-10-CM | POA: Diagnosis not present

## 2018-05-05 ENCOUNTER — Other Ambulatory Visit: Payer: Medicare Other

## 2018-05-06 DIAGNOSIS — R29818 Other symptoms and signs involving the nervous system: Secondary | ICD-10-CM | POA: Diagnosis not present

## 2018-05-06 DIAGNOSIS — R627 Adult failure to thrive: Secondary | ICD-10-CM | POA: Diagnosis not present

## 2018-05-06 DIAGNOSIS — I700 Atherosclerosis of aorta: Secondary | ICD-10-CM | POA: Diagnosis not present

## 2018-05-06 DIAGNOSIS — A8100 Creutzfeldt-Jakob disease, unspecified: Secondary | ICD-10-CM | POA: Diagnosis not present

## 2018-05-06 DIAGNOSIS — I10 Essential (primary) hypertension: Secondary | ICD-10-CM | POA: Diagnosis not present

## 2018-05-06 DIAGNOSIS — F0280 Dementia in other diseases classified elsewhere without behavioral disturbance: Secondary | ICD-10-CM | POA: Diagnosis not present

## 2018-05-11 ENCOUNTER — Other Ambulatory Visit: Payer: Medicare Other

## 2018-05-13 DIAGNOSIS — F0280 Dementia in other diseases classified elsewhere without behavioral disturbance: Secondary | ICD-10-CM | POA: Diagnosis not present

## 2018-05-13 DIAGNOSIS — R627 Adult failure to thrive: Secondary | ICD-10-CM | POA: Diagnosis not present

## 2018-05-13 DIAGNOSIS — I10 Essential (primary) hypertension: Secondary | ICD-10-CM | POA: Diagnosis not present

## 2018-05-13 DIAGNOSIS — R29818 Other symptoms and signs involving the nervous system: Secondary | ICD-10-CM | POA: Diagnosis not present

## 2018-05-13 DIAGNOSIS — I700 Atherosclerosis of aorta: Secondary | ICD-10-CM | POA: Diagnosis not present

## 2018-05-13 DIAGNOSIS — A8100 Creutzfeldt-Jakob disease, unspecified: Secondary | ICD-10-CM | POA: Diagnosis not present

## 2018-05-21 DIAGNOSIS — A8100 Creutzfeldt-Jakob disease, unspecified: Secondary | ICD-10-CM | POA: Diagnosis not present

## 2018-05-21 DIAGNOSIS — F0280 Dementia in other diseases classified elsewhere without behavioral disturbance: Secondary | ICD-10-CM | POA: Diagnosis not present

## 2018-05-21 DIAGNOSIS — R627 Adult failure to thrive: Secondary | ICD-10-CM | POA: Diagnosis not present

## 2018-05-21 DIAGNOSIS — I10 Essential (primary) hypertension: Secondary | ICD-10-CM | POA: Diagnosis not present

## 2018-05-21 DIAGNOSIS — I700 Atherosclerosis of aorta: Secondary | ICD-10-CM | POA: Diagnosis not present

## 2018-05-21 DIAGNOSIS — R29818 Other symptoms and signs involving the nervous system: Secondary | ICD-10-CM | POA: Diagnosis not present

## 2018-06-02 DIAGNOSIS — R739 Hyperglycemia, unspecified: Secondary | ICD-10-CM | POA: Diagnosis not present

## 2018-06-02 DIAGNOSIS — G309 Alzheimer's disease, unspecified: Secondary | ICD-10-CM | POA: Diagnosis not present

## 2018-06-02 DIAGNOSIS — I10 Essential (primary) hypertension: Secondary | ICD-10-CM | POA: Diagnosis not present

## 2018-06-08 DIAGNOSIS — F419 Anxiety disorder, unspecified: Secondary | ICD-10-CM | POA: Diagnosis not present

## 2018-06-08 DIAGNOSIS — Z23 Encounter for immunization: Secondary | ICD-10-CM | POA: Diagnosis not present

## 2018-06-08 DIAGNOSIS — E876 Hypokalemia: Secondary | ICD-10-CM | POA: Diagnosis not present

## 2018-06-08 DIAGNOSIS — G309 Alzheimer's disease, unspecified: Secondary | ICD-10-CM | POA: Diagnosis not present

## 2018-06-08 DIAGNOSIS — R001 Bradycardia, unspecified: Secondary | ICD-10-CM | POA: Diagnosis not present

## 2018-06-08 DIAGNOSIS — I10 Essential (primary) hypertension: Secondary | ICD-10-CM | POA: Diagnosis not present

## 2018-06-08 DIAGNOSIS — R739 Hyperglycemia, unspecified: Secondary | ICD-10-CM | POA: Diagnosis not present

## 2018-06-11 DIAGNOSIS — R001 Bradycardia, unspecified: Secondary | ICD-10-CM | POA: Diagnosis not present

## 2018-06-12 DIAGNOSIS — G2589 Other specified extrapyramidal and movement disorders: Secondary | ICD-10-CM | POA: Diagnosis not present

## 2018-06-12 DIAGNOSIS — F419 Anxiety disorder, unspecified: Secondary | ICD-10-CM | POA: Diagnosis not present

## 2018-06-12 DIAGNOSIS — Z79899 Other long term (current) drug therapy: Secondary | ICD-10-CM | POA: Diagnosis not present

## 2018-06-12 DIAGNOSIS — G3183 Dementia with Lewy bodies: Secondary | ICD-10-CM | POA: Diagnosis not present

## 2018-06-12 DIAGNOSIS — F0281 Dementia in other diseases classified elsewhere with behavioral disturbance: Secondary | ICD-10-CM | POA: Diagnosis not present

## 2018-06-12 DIAGNOSIS — R001 Bradycardia, unspecified: Secondary | ICD-10-CM | POA: Diagnosis not present

## 2018-07-08 DIAGNOSIS — G130 Paraneoplastic neuromyopathy and neuropathy: Secondary | ICD-10-CM | POA: Diagnosis not present

## 2018-07-08 DIAGNOSIS — A8100 Creutzfeldt-Jakob disease, unspecified: Secondary | ICD-10-CM | POA: Diagnosis not present

## 2018-07-08 DIAGNOSIS — I700 Atherosclerosis of aorta: Secondary | ICD-10-CM | POA: Diagnosis not present

## 2018-07-08 DIAGNOSIS — F0390 Unspecified dementia without behavioral disturbance: Secondary | ICD-10-CM | POA: Diagnosis not present

## 2018-07-09 DIAGNOSIS — F0281 Dementia in other diseases classified elsewhere with behavioral disturbance: Secondary | ICD-10-CM | POA: Diagnosis not present

## 2018-07-09 DIAGNOSIS — G3183 Dementia with Lewy bodies: Secondary | ICD-10-CM | POA: Diagnosis not present

## 2018-07-09 DIAGNOSIS — R260 Ataxic gait: Secondary | ICD-10-CM | POA: Diagnosis not present

## 2018-07-09 DIAGNOSIS — R9089 Other abnormal findings on diagnostic imaging of central nervous system: Secondary | ICD-10-CM | POA: Diagnosis not present

## 2018-07-13 ENCOUNTER — Ambulatory Visit (INDEPENDENT_AMBULATORY_CARE_PROVIDER_SITE_OTHER): Payer: Medicare Other | Admitting: Cardiovascular Disease

## 2018-07-13 ENCOUNTER — Encounter: Payer: Self-pay | Admitting: Cardiovascular Disease

## 2018-07-13 VITALS — BP 142/66 | HR 45 | Ht 69.0 in | Wt 115.0 lb

## 2018-07-13 DIAGNOSIS — F0390 Unspecified dementia without behavioral disturbance: Secondary | ICD-10-CM

## 2018-07-13 DIAGNOSIS — I495 Sick sinus syndrome: Secondary | ICD-10-CM

## 2018-07-13 NOTE — Patient Instructions (Signed)
Dr Sallyanne Kuster recommends that you follow-up with him as needed.  Call if, and when, you are ready to proceed with pacemaker implantation.

## 2018-07-14 ENCOUNTER — Encounter: Payer: Self-pay | Admitting: Cardiovascular Disease

## 2018-07-14 DIAGNOSIS — I495 Sick sinus syndrome: Secondary | ICD-10-CM

## 2018-07-14 HISTORY — DX: Sick sinus syndrome: I49.5

## 2018-07-14 NOTE — H&P (View-Only) (Signed)
Cardiology Consultation Note:    Date:  07/14/2018   ID:  Charles [name], DOB 11/16/1940, MRN [id number]  PCP:  Jani Gravel, MD  Cardiologist:  New Electrophysiologist:  None   Referring MD: Jani Gravel, MD   Chief Complaint  Patient presents with  . Bradycardia  Charles [name] is a 77 y.o. male who is being seen today for the evaluation of bradycardia at the request of Jani Gravel, MD.   History of Present Illness:    Charles [name] is a 77 y.o. male with a hx of rapidly progressive dementia, hypertension, history of diastolic dysfunction and aortic insufficiency, Parkinson's.  Although this is the first time I am seeing the patient, I have known him for years since his wife is also my patient.  Charles [name] had a rapid cognitive decline over the last couple of years and there is a suspicion he may have Lewy body dementia, although the diagnosis is not yet clear.  According to the family and based on mild peripheral observations, he has improved substantially after he started treatment with donepezil.  He is much more engaged and interactive than he was at [place] last clinic appointment.  He has had fatigue for years, this is recently worse.  When he saw Vassie Moment at [facility] he was severely bradycardic and he is referred here to discuss further treatment.  He has never experienced syncope.  He denies exertional angina or dyspnea.  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  Short-term memory loss has been a pervasive problem.  His Father and brother both had pacemakers.  Past Medical History:  Diagnosis Date  . Anxiety   . Aortic regurgitation   . Depression   . Diastolic dysfunction   . Hypertension     Past Surgical History:  Procedure Laterality Date  . acromioclavicular separation  07/04/2016  . EYE SURGERY    . HERNIA REPAIR    . NM MYOVIEW LTD    . US ECHOCARDIOGRAPHY      Current Medications: Current  Meds  Medication Sig  . acetaminophen (TYLENOL) 325 MG tablet Take 650 mg by mouth every 6 (six) hours as needed for mild pain.  Marland Kitchen amLODipine (NORVASC) 2.5 MG tablet Take 2.5 mg by mouth daily.  Marland Kitchen aspirin EC 81 MG tablet Take 81 mg by mouth daily.  . carbidopa-levodopa (SINEMET IR) 25-100 MG tablet Take by mouth.  . donepezil (ARICEPT) 10 MG tablet   . escitalopram (LEXAPRO) 10 MG tablet Take 10 mg by mouth daily.  Marland Kitchen L-Methylfolate-B12-B6-B2 (CEREFOLIN) 02-28-49-5 MG TABS Take by mouth.  . Multiple Vitamins-Minerals (CENTRUM ADULTS PO) Take by mouth.  . quinapril (ACCUPRIL) 40 MG tablet Take 40 mg by mouth 2 (two) times daily.     Allergies:   Propofol   Social History   Socioeconomic History  . Marital status: Married    Spouse name: Not on file  . Number of children: 3  . Years of education: Not on file  . Highest education level: Associate degree: academic program  Occupational History  . Not on file  Social Needs  . Financial resource strain: Not on file  . Food insecurity:    Worry: Not on file    Inability: Not on file  . Transportation needs:    Medical: Not on file    Non-medical: Not on file  Tobacco Use  . Smoking status: Never Smoker  . Smokeless tobacco: Never Used  Substance and Sexual Activity  . Alcohol use: No  . Drug use: No  . Sexual activity: Not on file  Lifestyle  . Physical activity:    Days per week: Not on file    Minutes per session: Not on file  . Stress: Not on file  Relationships  . Social connections:    Talks on phone: Not on file    Gets together: Not on file    Attends religious service: Not on file    Active member of club or organization: Not on file    Attends meetings of clubs or organizations: Not on file    Relationship status: Not on file  Other Topics Concern  . Not on file  Social History Narrative   Lives at home with his wife   Right handed   No caffeine      Family History: The patient's family history includes  Angina in his father; Asthma in his maternal grandmother; Diabetes in his brother, mother, and paternal grandfather; Heart Problems in his brother; Heart attack in his brother and father; Heart disease in his brother; Hypertension in his mother; Lung cancer in his mother; Stroke in his maternal grandfather; Thyroid disease in his mother. There is no history of Dementia.  ROS:   Please see the history of present illness.     All other systems reviewed and are negative.  EKGs/Labs/Other Studies Reviewed:    The following studies were reviewed today: Notes from [facility] medical  EKG:  EKG is ordered today.  The ekg ordered today demonstrates severe sinus bradycardia 45 bpm, otherwise normal tracing  Recent Labs: 03/26/2018: ALT 54; BUN 13; Creatinine, Ser 1.10; Hemoglobin 13.0; Magnesium 2.2; Platelets 190; Potassium 4.6; Sodium 138; TSH 2.530  Recent Lipid Panel No results found for: CHOL, TRIG, HDL, CHOLHDL, VLDL, LDLCALC, LDLDIRECT  Physical Exam:    VS:  BP (!) 142/66 (BP Location: Left Arm, Patient Position: Sitting, Cuff Size: Normal)   Pulse (!) 45   116 lb RR 15  Wt Readings from Last 3 Encounters:  04/10/18 115 lb (52.2 kg)  01/12/18 116 lb 12.8 oz (53 kg)  10/14/17 130 lb (59 kg)     GEN: Very thin, well developed in no acute distress HEENT: Normal NECK: No JVD; No carotid bruits LYMPHATICS: No lymphadenopathy CARDIAC: RRR, no murmurs, rubs, gallops RESPIRATORY:  Clear to auscultation without rales, wheezing or rhonchi  ABDOMEN: Soft, non-tender, non-distended MUSCULOSKELETAL:  No edema; No deformity  SKIN: Warm and dry NEUROLOGIC:  Alert and oriented x 3 PSYCHIATRIC:  Normal affect   ASSESSMENT:    1. SSS (sick sinus syndrome) ([place])   2. Rapidly progressive dementia ([place])    PLAN:    In order of problems listed above:  1. Sinus bradycardia: He has severe sinus bradycardia and is symptomatic.  Although his bradycardia may be worsened by treatment with a  cholinesterase inhibitor, this medication has clearly been of benefit.  I think he would feel better with a dual-chamber permanent pacemaker with rate response, although implantation of the device is not medically necessary.  His family supports this course of action, but the patient is a little skeptical and needs time to think about it.  We did go over the procedure in some technical detail, discussed benefits and risks, possible complications, long-term follow-up and outlook.  I think his prognosis will ultimately be impacted much more by his medical conditions and his cardiac problems.  I am ready to go ahead with pacemaker  implantation if he agrees.   Medication Adjustments/Labs and Tests Ordered: Current medicines are reviewed at length with the patient today.  Concerns regarding medicines are outlined above.  Orders Placed This Encounter  Procedures  . EKG 12-Lead   No orders of the defined types were placed in this encounter.   Patient Instructions  Dr Sallyanne Kuster recommends that you follow-up with him as needed.  Call if, and when, you are ready to proceed with pacemaker implantation.    Signed, Sanda Klein, MD  07/14/2018 9:09 AM     [facility]

## 2018-07-14 NOTE — Progress Notes (Signed)
Cardiology Consultation Note:    Date:  07/14/2018   ID:  Charles [name], DOB [birth year], MRN [id number]  PCP:  Jani Gravel, MD  Cardiologist:  New Electrophysiologist:  None   Referring MD: Jani Gravel, MD   Chief Complaint  Patient presents with  . Bradycardia  Charles [name] is a 77 y.o. male who is being seen today for the evaluation of bradycardia at the request of Jani Gravel, MD.   History of Present Illness:    Charles [name] is a 77 y.o. male with a hx of rapidly progressive dementia, hypertension, history of diastolic dysfunction and aortic insufficiency, Parkinson's.  Although this is the first time I am seeing the patient, I have known him for years since his wife is also my patient.  Charles [name] had a rapid cognitive decline over the last couple of years and there is a suspicion he may have Lewy body dementia, although the diagnosis is not yet clear.  According to the family and based on mild peripheral observations, he has improved substantially after he started treatment with donepezil.  He is much more engaged and interactive than he was at [place] last clinic appointment.  He has had fatigue for years, this is recently worse.  When he saw Vassie Moment at [facility] he was severely bradycardic and he is referred here to discuss further treatment.  He has never experienced syncope.  He denies exertional angina or dyspnea.  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  Short-term memory loss has been a pervasive problem.  His Father and brother both had pacemakers.  Past Medical History:  Diagnosis Date  . Anxiety   . Aortic regurgitation   . Depression   . Diastolic dysfunction   . Hypertension     Past Surgical History:  Procedure Laterality Date  . acromioclavicular separation  07/04/2016  . EYE SURGERY    . HERNIA REPAIR    . NM MYOVIEW LTD    . US ECHOCARDIOGRAPHY      Current Medications: Current  Meds  Medication Sig  . acetaminophen (TYLENOL) 325 MG tablet Take 650 mg by mouth every 6 (six) hours as needed for mild pain.  Marland Kitchen amLODipine (NORVASC) 2.5 MG tablet Take 2.5 mg by mouth daily.  Marland Kitchen aspirin EC 81 MG tablet Take 81 mg by mouth daily.  . carbidopa-levodopa (SINEMET IR) 25-100 MG tablet Take by mouth.  . donepezil (ARICEPT) 10 MG tablet   . escitalopram (LEXAPRO) 10 MG tablet Take 10 mg by mouth daily.  Marland Kitchen L-Methylfolate-B12-B6-B2 (CEREFOLIN) 02-28-49-5 MG TABS Take by mouth.  . Multiple Vitamins-Minerals (CENTRUM ADULTS PO) Take by mouth.  . quinapril (ACCUPRIL) 40 MG tablet Take 40 mg by mouth 2 (two) times daily.     Allergies:   Propofol   Social History   Socioeconomic History  . Marital status: Married    Spouse name: Not on file  . Number of children: 3  . Years of education: Not on file  . Highest education level: Associate degree: academic program  Occupational History  . Not on file  Social Needs  . Financial resource strain: Not on file  . Food insecurity:    Worry: Not on file    Inability: Not on file  . Transportation needs:    Medical: Not on file    Non-medical: Not on file  Tobacco Use  . Smoking status: Never Smoker  . Smokeless tobacco: Never Used  Substance and Sexual Activity  . Alcohol use: No  . Drug use: No  . Sexual activity: Not on file  Lifestyle  . Physical activity:    Days per week: Not on file    Minutes per session: Not on file  . Stress: Not on file  Relationships  . Social connections:    Talks on phone: Not on file    Gets together: Not on file    Attends religious service: Not on file    Active member of club or organization: Not on file    Attends meetings of clubs or organizations: Not on file    Relationship status: Not on file  Other Topics Concern  . Not on file  Social History Narrative   Lives at home with his wife   Right handed   No caffeine      Family History: The patient's family history includes  Angina in his father; Asthma in his maternal grandmother; Diabetes in his brother, mother, and paternal grandfather; Heart Problems in his brother; Heart attack in his brother and father; Heart disease in his brother; Hypertension in his mother; Lung cancer in his mother; Stroke in his maternal grandfather; Thyroid disease in his mother. There is no history of Dementia.  ROS:   Please see the history of present illness.     All other systems reviewed and are negative.  EKGs/Labs/Other Studies Reviewed:    The following studies were reviewed today: Notes from [facility] medical  EKG:  EKG is ordered today.  The ekg ordered today demonstrates severe sinus bradycardia 45 bpm, otherwise normal tracing  Recent Labs: 03/26/2018: ALT 54; BUN 13; Creatinine, Ser 1.10; Hemoglobin 13.0; Magnesium 2.2; Platelets 190; Potassium 4.6; Sodium 138; TSH 2.530  Recent Lipid Panel No results found for: CHOL, TRIG, HDL, CHOLHDL, VLDL, LDLCALC, LDLDIRECT  Physical Exam:    VS:  BP (!) 142/66 (BP Location: Left Arm, Patient Position: Sitting, Cuff Size: Normal)   Pulse (!) 45   116 lb RR 15  Wt Readings from Last 3 Encounters:  04/10/18 115 lb (52.2 kg)  01/12/18 116 lb 12.8 oz (53 kg)  10/14/17 130 lb (59 kg)     GEN: Very thin, well developed in no acute distress HEENT: Normal NECK: No JVD; No carotid bruits LYMPHATICS: No lymphadenopathy CARDIAC: RRR, no murmurs, rubs, gallops RESPIRATORY:  Clear to auscultation without rales, wheezing or rhonchi  ABDOMEN: Soft, non-tender, non-distended MUSCULOSKELETAL:  No edema; No deformity  SKIN: Warm and dry NEUROLOGIC:  Alert and oriented x 3 PSYCHIATRIC:  Normal affect   ASSESSMENT:    1. SSS (sick sinus syndrome) ([place])   2. Rapidly progressive dementia ([place])    PLAN:    In order of problems listed above:  1. Sinus bradycardia: He has severe sinus bradycardia and is symptomatic.  Although his bradycardia may be worsened by treatment with a  cholinesterase inhibitor, this medication has clearly been of benefit.  I think he would feel better with a dual-chamber permanent pacemaker with rate response, although implantation of the device is not medically necessary.  His family supports this course of action, but the patient is a little skeptical and needs time to think about it.  We did go over the procedure in some technical detail, discussed benefits and risks, possible complications, long-term follow-up and outlook.  I think his prognosis will ultimately be impacted much more by his medical conditions and his cardiac problems.  I am ready to go ahead with pacemaker  implantation if he agrees.   Medication Adjustments/Labs and Tests Ordered: Current medicines are reviewed at length with the patient today.  Concerns regarding medicines are outlined above.  Orders Placed This Encounter  Procedures  . EKG 12-Lead   No orders of the defined types were placed in this encounter.   Patient Instructions  Dr Sallyanne Kuster recommends that you follow-up with him as needed.  Call if, and when, you are ready to proceed with pacemaker implantation.    Signed, Sanda Klein, MD  07/14/2018 9:09 AM    [facility]

## 2018-07-20 ENCOUNTER — Telehealth (HOSPITAL_COMMUNITY): Payer: Self-pay | Admitting: Cardiovascular Disease

## 2018-07-20 DIAGNOSIS — I495 Sick sinus syndrome: Secondary | ICD-10-CM

## 2018-07-20 DIAGNOSIS — Z01812 Encounter for preprocedural laboratory examination: Secondary | ICD-10-CM

## 2018-07-20 NOTE — Telephone Encounter (Signed)
New message   Patient's wife states that her husband is scheduled to have a pace maker installed. Please contact the patient's wife to set up the time for this procedure.

## 2018-07-20 NOTE — Telephone Encounter (Signed)
Spoke with patient's wife, Tana Conch. Agreeable to 08/05/18.

## 2018-07-20 NOTE — Telephone Encounter (Signed)
Called patient, advised that I was sending a message to [facility] to notify that they were ready to schedule the pace maker procedure. She states that the days that would work better would be on Wednesday's the 6th, 13th, or the 20th.  Thank you!

## 2018-07-22 NOTE — Telephone Encounter (Signed)
Procedure scheduled as discussed. lmtcb to discuss coming in for labs/instructions.

## 2018-07-22 NOTE — Telephone Encounter (Signed)
Preliminary instructions reviewed with wife, Tana Conch. Birdie Sons to bring patient in on 08/03/18 for labs and we will review all instructions for procedure at that time. Wife verbalized understanding and agreed with plan.

## 2018-08-03 ENCOUNTER — Encounter: Payer: Self-pay | Admitting: Cardiovascular Disease

## 2018-08-03 DIAGNOSIS — Z01812 Encounter for preprocedural laboratory examination: Secondary | ICD-10-CM | POA: Diagnosis not present

## 2018-08-03 DIAGNOSIS — I495 Sick sinus syndrome: Secondary | ICD-10-CM | POA: Diagnosis not present

## 2018-08-03 LAB — BASIC METABOLIC PANEL
BUN / CREAT RATIO: 17 (ref 10–24)
BUN: 18 mg/dL (ref 8–27)
CHLORIDE: 97 mmol/L (ref 96–106)
CO2: 27 mmol/L (ref 20–29)
Calcium: 9.8 mg/dL (ref 8.6–10.2)
Creatinine, Ser: 1.08 mg/dL (ref 0.76–1.27)
GFR calc non Af Amer: 66 mL/min/{1.73_m2} (ref >59)
GFR, EST AFRICAN AMERICAN: 77 mL/min/{1.73_m2} (ref >59)
Glucose: 85 mg/dL (ref 65–99)
POTASSIUM: 5.1 mmol/L (ref 3.5–5.2)
Sodium: 141 mmol/L (ref 134–144)

## 2018-08-03 LAB — CBC
HEMATOCRIT: 38.3 % (ref 37.5–51.0)
Hemoglobin: 13.1 g/dL (ref 13.0–17.7)
MCH: 32 pg (ref 26.6–33.0)
MCHC: 34.2 g/dL (ref 31.5–35.7)
MCV: 94 fL (ref 79–97)
Platelets: 204 10*3/uL (ref 150–450)
RBC: 4.09 x10E6/uL — ABNORMAL LOW (ref 4.14–5.80)
RDW: 12.5 % (ref 12.3–15.4)
WBC: 7.2 10*3/uL (ref 3.4–10.8)

## 2018-08-03 LAB — PROTIME-INR
INR: 1 (ref 0.8–1.2)
PROTHROMBIN TIME: 11.1 s (ref 9.1–12.0)

## 2018-08-03 NOTE — Addendum Note (Signed)
Addended by: Diana Eves on: 08/03/2018 09:27 AM   Modules accepted: Orders

## 2018-08-04 ENCOUNTER — Ambulatory Visit: Payer: Medicare Other | Admitting: Cardiovascular Disease

## 2018-08-05 ENCOUNTER — Encounter (HOSPITAL_COMMUNITY): Payer: Self-pay | Admitting: General Practice

## 2018-08-05 ENCOUNTER — Other Ambulatory Visit: Payer: Self-pay

## 2018-08-05 ENCOUNTER — Ambulatory Visit (HOSPITAL_COMMUNITY): Payer: Medicare Other

## 2018-08-05 ENCOUNTER — Inpatient Hospital Stay (HOSPITAL_COMMUNITY)
Admission: RE | Disposition: A | Discharge status: Home / Self Care | Payer: Self-pay | Source: Ambulatory Visit | Attending: Cardiovascular Disease

## 2018-08-05 ENCOUNTER — Ambulatory Visit (HOSPITAL_COMMUNITY)
Admission: RE | Admit: 2018-08-05 | Discharge: 2018-08-07 | Disposition: A | Discharge status: Home / Self Care | Payer: Medicare Other | Source: Ambulatory Visit | Attending: Cardiovascular Disease | Admitting: Cardiovascular Disease

## 2018-08-05 DIAGNOSIS — I495 Sick sinus syndrome: Secondary | ICD-10-CM | POA: Diagnosis present

## 2018-08-05 DIAGNOSIS — I351 Nonrheumatic aortic (valve) insufficiency: Secondary | ICD-10-CM | POA: Diagnosis not present

## 2018-08-05 DIAGNOSIS — I10 Essential (primary) hypertension: Secondary | ICD-10-CM | POA: Diagnosis not present

## 2018-08-05 DIAGNOSIS — Z823 Family history of stroke: Secondary | ICD-10-CM | POA: Insufficient documentation

## 2018-08-05 DIAGNOSIS — Z4682 Encounter for fitting and adjustment of non-vascular catheter: Secondary | ICD-10-CM | POA: Diagnosis not present

## 2018-08-05 DIAGNOSIS — F0390 Unspecified dementia without behavioral disturbance: Secondary | ICD-10-CM | POA: Diagnosis not present

## 2018-08-05 DIAGNOSIS — Z950 Presence of cardiac pacemaker: Secondary | ICD-10-CM | POA: Diagnosis not present

## 2018-08-05 DIAGNOSIS — J939 Pneumothorax, unspecified: Secondary | ICD-10-CM | POA: Diagnosis not present

## 2018-08-05 DIAGNOSIS — G20 Parkinson's disease: Secondary | ICD-10-CM | POA: Insufficient documentation

## 2018-08-05 DIAGNOSIS — Z888 Allergy status to other drugs, medicaments and biological substances status: Secondary | ICD-10-CM | POA: Insufficient documentation

## 2018-08-05 DIAGNOSIS — Z8249 Family history of ischemic heart disease and other diseases of the circulatory system: Secondary | ICD-10-CM | POA: Diagnosis not present

## 2018-08-05 DIAGNOSIS — Z79899 Other long term (current) drug therapy: Secondary | ICD-10-CM | POA: Insufficient documentation

## 2018-08-05 DIAGNOSIS — Z938 Other artificial opening status: Secondary | ICD-10-CM

## 2018-08-05 DIAGNOSIS — N342 Other urethritis: Secondary | ICD-10-CM

## 2018-08-05 DIAGNOSIS — F0280 Dementia in other diseases classified elsewhere without behavioral disturbance: Secondary | ICD-10-CM | POA: Diagnosis not present

## 2018-08-05 DIAGNOSIS — Z9889 Other specified postprocedural states: Secondary | ICD-10-CM | POA: Insufficient documentation

## 2018-08-05 DIAGNOSIS — Z7982 Long term (current) use of aspirin: Secondary | ICD-10-CM | POA: Insufficient documentation

## 2018-08-05 HISTORY — PX: CHEST TUBE INSERTION: SHX231

## 2018-08-05 HISTORY — DX: Sick sinus syndrome: I49.5

## 2018-08-05 HISTORY — DX: Presence of cardiac pacemaker: Z95.0

## 2018-08-05 HISTORY — DX: Other complications of anesthesia, initial encounter: T88.59XA

## 2018-08-05 HISTORY — PX: PACEMAKER IMPLANT: EP1218

## 2018-08-05 HISTORY — DX: Adverse effect of unspecified anesthetic, initial encounter: T41.45XA

## 2018-08-05 LAB — SURGICAL PCR SCREEN
MRSA, PCR: NEGATIVE
STAPHYLOCOCCUS AUREUS: NEGATIVE

## 2018-08-05 SURGERY — PACEMAKER IMPLANT

## 2018-08-05 MED ORDER — ONDANSETRON HCL 4 MG/2ML IJ SOLN: 4.0000 mg | Freq: Four times a day (QID) | INTRAMUSCULAR | Status: DC | PRN

## 2018-08-05 MED ORDER — LISINOPRIL 40 MG PO TABS
40.0000 mg | ORAL_TABLET | Freq: Two times a day (BID) | ORAL | Status: DC
  Administered 2018-08-05 – 2018-08-07 (×3): 40 mg via ORAL
  Filled 2018-08-05 (×4): qty 1

## 2018-08-05 MED ORDER — SODIUM CHLORIDE 0.9 % IV SOLN
INTRAVENOUS | Status: AC
  Filled 2018-08-05: qty 2

## 2018-08-05 MED ORDER — LIDOCAINE HCL (PF) 1 % IJ SOLN
INTRAMUSCULAR | Status: AC
  Filled 2018-08-05: qty 60

## 2018-08-05 MED ORDER — KETOROLAC TROMETHAMINE 15 MG/ML IJ SOLN
15.0000 mg | Freq: Three times a day (TID) | INTRAMUSCULAR | Status: DC | PRN
  Administered 2018-08-05 – 2018-08-06 (×2): 15 mg via INTRAVENOUS
  Filled 2018-08-05 (×3): qty 1

## 2018-08-05 MED ORDER — IOPAMIDOL (ISOVUE-370) INJECTION 76%
INTRAVENOUS | Status: AC
  Filled 2018-08-05: qty 50

## 2018-08-05 MED ORDER — CARBIDOPA-LEVODOPA 25-100 MG PO TABS
1.0000 | ORAL_TABLET | ORAL | Status: DC
  Administered 2018-08-05 – 2018-08-07 (×6): 1 via ORAL
  Filled 2018-08-05 (×8): qty 1

## 2018-08-05 MED ORDER — ASPIRIN EC 81 MG PO TBEC
81.0000 mg | DELAYED_RELEASE_TABLET | Freq: Every day | ORAL | Status: DC
  Administered 2018-08-06 – 2018-08-07 (×2): 81 mg via ORAL
  Filled 2018-08-05 (×2): qty 1

## 2018-08-05 MED ORDER — SODIUM CHLORIDE 0.9 % IV SOLN: 80.0000 mg | INTRAVENOUS | Status: DC

## 2018-08-05 MED ORDER — SODIUM CHLORIDE 0.9 % IV SOLN: INTRAVENOUS | Status: DC

## 2018-08-05 MED ORDER — IOPAMIDOL (ISOVUE-370) INJECTION 76%
INTRAVENOUS | Status: DC | PRN
  Administered 2018-08-05: 10 mL via INTRAVENOUS

## 2018-08-05 MED ORDER — MUPIROCIN 2 % EX OINT
TOPICAL_OINTMENT | CUTANEOUS | Status: AC
  Administered 2018-08-05: 1
  Filled 2018-08-05: qty 22

## 2018-08-05 MED ORDER — SODIUM CHLORIDE 0.9 % IV SOLN
INTRAVENOUS | Status: DC
  Administered 2018-08-05: 13:00:00 via INTRAVENOUS

## 2018-08-05 MED ORDER — HEPARIN (PORCINE) IN NACL 1000-0.9 UT/500ML-% IV SOLN
INTRAVENOUS | Status: DC | PRN
  Administered 2018-08-05: 500 mL

## 2018-08-05 MED ORDER — OFF THE BEAT BOOK
Freq: Once | Status: AC
  Administered 2018-08-05: 22:00:00
  Filled 2018-08-05: qty 1

## 2018-08-05 MED ORDER — AMLODIPINE BESYLATE 5 MG PO TABS
2.5000 mg | ORAL_TABLET | Freq: Every day | ORAL | Status: DC
  Administered 2018-08-06 – 2018-08-07 (×2): 2.5 mg via ORAL
  Filled 2018-08-05 (×2): qty 1

## 2018-08-05 MED ORDER — ACETAMINOPHEN 325 MG PO TABS
650.0000 mg | ORAL_TABLET | Freq: Four times a day (QID) | ORAL | Status: DC | PRN
  Filled 2018-08-05: qty 2

## 2018-08-05 MED ORDER — DONEPEZIL HCL 10 MG PO TABS
10.0000 mg | ORAL_TABLET | Freq: Every day | ORAL | Status: DC
  Administered 2018-08-06 – 2018-08-07 (×2): 10 mg via ORAL
  Filled 2018-08-05 (×2): qty 1

## 2018-08-05 MED ORDER — CEFAZOLIN SODIUM-DEXTROSE 2-4 GM/100ML-% IV SOLN
INTRAVENOUS | Status: AC
  Filled 2018-08-05: qty 100

## 2018-08-05 MED ORDER — CHLORHEXIDINE GLUCONATE 4 % EX LIQD
60.0000 mL | Freq: Once | CUTANEOUS | Status: DC
  Filled 2018-08-05: qty 60

## 2018-08-05 MED ORDER — CEFAZOLIN SODIUM-DEXTROSE 2-3 GM-%(50ML) IV SOLR
INTRAVENOUS | Status: AC | PRN
  Administered 2018-08-05: 2 g via INTRAVENOUS

## 2018-08-05 MED ORDER — PANTOPRAZOLE SODIUM 40 MG PO TBEC
40.0000 mg | DELAYED_RELEASE_TABLET | Freq: Every day | ORAL | Status: DC
  Administered 2018-08-06 – 2018-08-07 (×2): 40 mg via ORAL
  Filled 2018-08-05 (×2): qty 1

## 2018-08-05 MED ORDER — LIDOCAINE HCL (PF) 1 % IJ SOLN
INTRAMUSCULAR | Status: DC | PRN
  Administered 2018-08-05: 30 mL

## 2018-08-05 MED ORDER — HEPARIN (PORCINE) IN NACL 1000-0.9 UT/500ML-% IV SOLN
INTRAVENOUS | Status: AC
  Filled 2018-08-05: qty 500

## 2018-08-05 MED ORDER — YOU HAVE A PACEMAKER BOOK
Freq: Once | Status: AC
  Administered 2018-08-05: 22:00:00
  Filled 2018-08-05: qty 1

## 2018-08-05 MED ORDER — CEFAZOLIN SODIUM-DEXTROSE 2-4 GM/100ML-% IV SOLN
2.0000 g | INTRAVENOUS | Status: DC
  Filled 2018-08-05: qty 100

## 2018-08-05 MED ORDER — CEFAZOLIN SODIUM-DEXTROSE 1-4 GM/50ML-% IV SOLN
1.0000 g | Freq: Four times a day (QID) | INTRAVENOUS | Status: AC
  Administered 2018-08-05 – 2018-08-06 (×3): 1 g via INTRAVENOUS
  Filled 2018-08-05 (×3): qty 50

## 2018-08-05 MED ORDER — QUINAPRIL HCL 10 MG PO TABS: 40.0000 mg | ORAL_TABLET | Freq: Two times a day (BID) | ORAL | Status: DC

## 2018-08-05 SURGICAL SUPPLY — 9 items
CABLE SURGICAL S-101-97-12 (CABLE) ×2 IMPLANT
KIT MICROPUNCTURE NIT STIFF (SHEATH) ×2 IMPLANT
LEAD TENDRIL MRI 46CM LPA1200M (Lead) ×2 IMPLANT
LEAD TENDRIL MRI 52CM LPA1200M (Lead) ×2 IMPLANT
PACEMAKER ASSURITY DR-RF (Pacemaker) ×2 IMPLANT
PAD PRO RADIOLUCENT 2001M-C (PAD) ×2 IMPLANT
SHEATH CLASSIC 8F (SHEATH) ×4 IMPLANT
SHEATH PINNACLE 8F 10CM (SHEATH) IMPLANT
TRAY PACEMAKER INSERTION (PACKS) ×2 IMPLANT

## 2018-08-05 NOTE — Progress Notes (Signed)
Regretfully, post-procedure CXR shows a moderate L pneumothorax after pacemaker implantation. Charles [name] remains asymptomatic. Respiratory rate 16. Oxygen saturation 100% on room air.  Despite the lack of dyspnea/hypoxia, the pneumothorax has accumulated relatively quickly. He may require a chest tube. Oxygen 100% by face mask was initiated Have asked Dr. Lake Bells of the Pulmonary service to see him in consultation.  Patient and family updated. Either way, he will not be ready for DC tomorrow morning.  Charles Klein, MD, [facility] [phone] office [phone] pager'

## 2018-08-05 NOTE — Interval H&P Note (Signed)
History and Physical Interval Note:  08/05/2018 12:55 PM  Charles [name]  has presented today for surgery, with the diagnosis of sss  The various methods of treatment have been discussed with the patient and family. After consideration of risks, benefits and other options for treatment, the patient has consented to  Procedure(s): PACEMAKER IMPLANT (N/A) as a surgical intervention .  The patient's history has been reviewed, patient examined, no change in status, stable for surgery.  I have reviewed the patient's chart and labs.  Questions were answered to the patient's satisfaction.     Charles [name]

## 2018-08-05 NOTE — Progress Notes (Signed)
Radiology called and stated chest x-ray needed STAT, order placed at 1809, states order just came through and tech is on the way. Chest x-ray being obtained at this time. Patient has moments when he says he is in "pain" pain medication offered multiple times and patient continues to refuse Tylenol. Will offer IV pain medication. Patient states "It only hurts sometimes when I breath. It is not hurting much right now." Family at bedside concerned that patient is in more pain than stating and just has a hard time making decisions to take medications. Re-assured family that nursing staff will continue to address and offer medication for pain.

## 2018-08-05 NOTE — Progress Notes (Signed)
Dr Sallyanne Kuster at bedside with Jennelle Human ACNP, request Nonrebreather, nonrebreather placed on patient at this time. Patient remains at bedside with no S/S of distress noted or complaints voiced. Noted per chest x-ray, patient has a pneumothorax and may require a chest tube, family consent to chest tube if needed. Will continue to monitor.

## 2018-08-05 NOTE — Progress Notes (Signed)
[facility] Progress Note Patient Name: Charles [name] DOB: [birth year] MRN: [id number]   Date of Service  08/05/2018  HPI/Events of Note  Patient with LEFT PTX s/p Chest tube  eICU Interventions  CXR shows expanded Left lung     Intervention Category Intermediate Interventions: Respiratory distress - evaluation and management  Siler Mavis 08/05/2018, 8:09 PM

## 2018-08-05 NOTE — Progress Notes (Signed)
Per Dr. Marylou Flesher patient only needs IV on left side.

## 2018-08-05 NOTE — Progress Notes (Addendum)
IV noted to left arm removed due to pacemaker placement to left side. Right arm noted to have areas of bruising and what appears to be blown IV sites, soft, puffy, with bruising noted. New IV started to right forearm away from puffy blown IV site areas. Patient tolerated well. Family at bedside. At time of arrival patient A&Ox3, however, at this time, patient noted to ask the same questions over and over again at times. Patient does not try to get our of bed, he does follow commands.

## 2018-08-05 NOTE — Progress Notes (Addendum)
Consent obtained for chest tube placement and assisted Dr Lake Bells with chest tube insertion. Chest Xray order obtained. Patient tolerated procedure well.

## 2018-08-05 NOTE — Progress Notes (Signed)
Chest xray performed

## 2018-08-05 NOTE — Op Note (Addendum)
Procedure report  Procedure performed:  1. Implantation of new dual chamber permanent pacemaker 2. Fluoroscopy 3. Left upper extremity venography  Reason for procedure: Symptomatic bradycardia due to: Sinus node dysfunction   Procedure performed by: Sanda Klein, MD  Complications: No immediate complications. STAT chest X-ray was ordered to evaluate for pneumothorax due to difficult access.  Estimated blood loss: 50 mL  Medications administered during procedure: Ancef 2 g intravenously Lidocaine 1% 30 mL locally Isovue 10 mL for venography via left antecubital vein  Device details: Generator St. Jude Assurity MRI model 2272 serial number [id number] Right atrial lead St. Jude Tendril MRI [id number] serial number [id number] Right ventricular lead St. Jude Tendril MRI [id number] serial number [id number]  Procedure details:  After the risks and benefits of the procedure were discussed the patient provided informed consent and was brought to the cardiac cath lab in the fasting state. The patient was prepped and draped in usual sterile fashion. Local anesthesia with 1% lidocaine was administered to to the left infraclavicular area. A 5-6 cm horizontal incision was made parallel with and 2-3 cm caudal to the left clavicle. Using electrocautery and blunt dissection a prepectoral pocket was created down to the level of the pectoralis major muscle fascia. The pocket was carefully inspected for hemostasis. The subcutaneous tissue was minimal, making for a very superficial pocket, due to weight loss.  A left arm venogram was performed for road-mapping. Considerable difficulty was encountered with venous access, as the vein was very superficial. Multiple attempts were necessary before the vein was accessed. A signle puncture was used to pass 2 J wires and place 2 74F safe sheaths. A pre-tie with 2-0 silk was performed around the sheaths and tension was applied to it to reduce the bleeding at  the access site..  Under fluoroscopic guidance the ventricular lead was advanced to level of the mid to apical right ventricular septum and the active-fixation helix was deployed. Prominent current of injury was seen. Satisfactory pacing and sensing parameters were recorded. There was no evidence of diaphragmatic stimulation at maximum device output. The safe sheath was peeled away and the lead was secured in place with 2-0 silk.  In similar fashion the right atrial lead was advanced to the level of the atrial appendage. The active-fixation helix was deployed. There was prominent current of injury. Satisfactory  pacing and sensing parameters were recorded. There was no evidence of diaphragmatic stimulation with pacing at maximum device output. The safe sheath was peeled away and the lead was secured in place with 2-0 silk.  The pre-tie was fastened around both lead suture sleeves, with good hemostasis. The pocket was flushed with copious amounts of antibiotic solution. Reinspection showed excellent hemostasis..  The ventricular lead was connected to the generator and appropriate ventricular pacing was seen. Subsequently the atrial lead was also connected. Repeat testing of the lead parameters later showed excellent values.  The entire system was then carefully inserted in the pocket with care been taking that the leads and device assumed a comfortable position without pressure on the incision. Great care was taken that the leads be located deep to the generator. The pocket was then closed in layers using 2 layers of 2-0 Vicryl and cutaneous staples, after which a sterile dressing was applied.  At the end of the procedure the following lead parameters were encountered:  Right atrial lead sensed P waves 3.8 mV, impedance 572 ohms, threshold 0.9 V at 0.5 ms pulse width.  Right ventricular lead sensed R  waves 11.3 mV, impedance 845ohms, threshold 0.7 V at 0.5 ms pulse width.   Sanda Klein, MD,  [facility] [phone] office [phone] pager 08/05/2018 3:19 PM
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
## 2018-08-05 NOTE — Procedures (Signed)
Chest Tube Insertion Procedure Note  Indications:  Clinically significant Pneumothorax  Pre-operative Diagnosis: Pneumothorax  Post-operative Diagnosis: Pneumothorax  Procedure Details  Informed consent was obtained for the procedure, including sedation.  Risks of lung perforation, hemorrhage, arrhythmia, and adverse drug reaction were discussed.   After sterile skin prep with chlorhexadine and local anesthesia with lidocaine, using standard technique, a 14 French tube was placed in the left lateral 10th rib space using the Seldinger technique.  Findings: Small rush of air with needle placement in the left thorax  Estimated Blood Loss:  less than 50 mL         Specimens:  None              Complications:  None; patient tolerated the procedure well.         Disposition: ICU - intubated and critically ill.         Condition: stable  Attending Attestation: I performed the procedure.  Roselie Awkward, MD [place] [facility] Pager: [phone] Cell: [phone] After 3pm or if no response, call [phone]

## 2018-08-05 NOTE — Progress Notes (Signed)
Dr Lake Bells at bedside at this time.

## 2018-08-06 ENCOUNTER — Encounter (HOSPITAL_COMMUNITY): Payer: Self-pay | Admitting: Cardiovascular Disease

## 2018-08-06 ENCOUNTER — Ambulatory Visit (HOSPITAL_COMMUNITY): Payer: Medicare Other

## 2018-08-06 DIAGNOSIS — J939 Pneumothorax, unspecified: Secondary | ICD-10-CM | POA: Diagnosis not present

## 2018-08-06 DIAGNOSIS — Z823 Family history of stroke: Secondary | ICD-10-CM | POA: Diagnosis not present

## 2018-08-06 DIAGNOSIS — I10 Essential (primary) hypertension: Secondary | ICD-10-CM | POA: Diagnosis not present

## 2018-08-06 DIAGNOSIS — J95811 Postprocedural pneumothorax: Secondary | ICD-10-CM | POA: Diagnosis not present

## 2018-08-06 DIAGNOSIS — I495 Sick sinus syndrome: Secondary | ICD-10-CM | POA: Diagnosis not present

## 2018-08-06 DIAGNOSIS — G20 Parkinson's disease: Secondary | ICD-10-CM | POA: Diagnosis not present

## 2018-08-06 DIAGNOSIS — F0280 Dementia in other diseases classified elsewhere without behavioral disturbance: Secondary | ICD-10-CM | POA: Diagnosis not present

## 2018-08-06 DIAGNOSIS — I309 Acute pericarditis, unspecified: Secondary | ICD-10-CM | POA: Diagnosis not present

## 2018-08-06 DIAGNOSIS — Z79899 Other long term (current) drug therapy: Secondary | ICD-10-CM | POA: Diagnosis not present

## 2018-08-06 DIAGNOSIS — N342 Other urethritis: Secondary | ICD-10-CM | POA: Diagnosis not present

## 2018-08-06 DIAGNOSIS — I351 Nonrheumatic aortic (valve) insufficiency: Secondary | ICD-10-CM | POA: Diagnosis not present

## 2018-08-06 DIAGNOSIS — Z8249 Family history of ischemic heart disease and other diseases of the circulatory system: Secondary | ICD-10-CM | POA: Diagnosis not present

## 2018-08-06 DIAGNOSIS — Z4682 Encounter for fitting and adjustment of non-vascular catheter: Secondary | ICD-10-CM | POA: Diagnosis not present

## 2018-08-06 DIAGNOSIS — Z9889 Other specified postprocedural states: Secondary | ICD-10-CM | POA: Diagnosis not present

## 2018-08-06 DIAGNOSIS — G3183 Dementia with Lewy bodies: Secondary | ICD-10-CM | POA: Diagnosis not present

## 2018-08-06 DIAGNOSIS — Z7982 Long term (current) use of aspirin: Secondary | ICD-10-CM | POA: Diagnosis not present

## 2018-08-06 DIAGNOSIS — T82897A Other specified complication of cardiac prosthetic devices, implants and grafts, initial encounter: Secondary | ICD-10-CM | POA: Diagnosis not present

## 2018-08-06 DIAGNOSIS — Z888 Allergy status to other drugs, medicaments and biological substances status: Secondary | ICD-10-CM | POA: Diagnosis not present

## 2018-08-06 DIAGNOSIS — I472 Ventricular tachycardia: Secondary | ICD-10-CM | POA: Diagnosis not present

## 2018-08-06 MED ORDER — ENSURE ENLIVE PO LIQD
237.0000 mL | Freq: Two times a day (BID) | ORAL | Status: DC
  Administered 2018-08-06 – 2018-08-07 (×3): 237 mL via ORAL
  Filled 2018-08-06 (×5): qty 237

## 2018-08-06 MED FILL — Gentamicin Sulfate Inj 40 MG/ML: INTRAMUSCULAR | Qty: 80 | Status: AC

## 2018-08-06 NOTE — Progress Notes (Signed)
Pacemaker lead parameters are in acceptable range. CXR shows normal device configuration and good chest tube position, reexpansion of the left lung. Defer chest tube management to PCCM - their help is greatly appreciated.  Sanda Klein, MD, [facility] [phone] office [phone] pager

## 2018-08-06 NOTE — Progress Notes (Signed)
PT Cancellation Note  Patient Details Name: DONOVYN GUIDICE MRN: [id number] DOB: 1941-05-30   Cancelled Treatment:    Reason Eval/Treat Not Completed: Other (comment) Per orders, want to hold on PT until chest tube is pulled. Will follow up once chest tube removed and as schedule allows.   [name], PT, DPT  [facility] Services  Pager: [phone] Office: [phone]  [name] 08/06/2018, 2:01 PM

## 2018-08-06 NOTE — Consult Note (Signed)
NAME:  Charles [name], MRN:  [id number], DOB:  November 05, 1940, LOS: 0 ADMISSION DATE:  08/05/2018, CONSULTATION DATE:  08/05/2018 REFERRING MD:  Dr. Sallyanne Kuster, CHIEF COMPLAINT:  Left pneumothorax  Brief History   57 yoM with left pneumothorax after PPM today for symptomatic bradycardia due to sinus node dysfunction.    Past Medical History  Dementia, HTN, diastolic HF, aortic insufficiency, Parkinson's  Significant Hospital Events   11/6- PPM placed/ admit 08/05/2018 left pneumothorax status post permanent pacemaker insertion 04/04/2018 left Wayne pneumothorax tube placed per pulmonary critical care  Consults: date of consult/date signed off & final recs:  11/6 PCCM   Procedures (surgical and bedside):  11/6 PPM placed by Cards  Significant Diagnostic Tests:  11/6 CXR  >> 1. Moderate left-sided pneumothorax following placement of new a chamber pacemaker. 2. Lead tips project over the right atrium and right ventricle. 08/06/2018 chest x-ray reveals small apical pneumothorax, chest x-ray reviewed by me.  Chest tube in good position Chest x-ray ordered for 1400 08/06/2018>>  Micro Data:  11/6 MRSA PCR >> neg  Antimicrobials:  11/6 cefazolin pre-op  Subjective:  No acute respiratory distress  Objective   Blood pressure 134/73, pulse 60, temperature 98.3 F (36.8 C), temperature source Oral, resp. rate 17, height 5\' 9"  (1.753 m), weight 52 kg, SpO2 100 %.        Intake/Output Summary (Last 24 hours) at 08/06/2018 1016 Last data filed at 08/06/2018 0805 Gross per 24 hour  Intake 540 ml  Output 875 ml  Net -335 ml   Filed Weights   08/05/18 1145 08/06/18 0713  Weight: 52.2 kg 52 kg    Examination: General: Frail elderly male in no acute distress HEENT: No JVD lymphadenopathy is appreciated Neuro: Alert orientated follows commands somewhat stunned CV: Sounds are regular PULM: even/non-labored, lungs bilaterally creased breath sounds, left chest tube without air leak change  to waterseal, left chest tube dressing without overt bleeding XB:DZHG, non-tender, bsx4 active  Extremities: warm/dry, 1+ edema  Skin: no rashes or lesions    Resolved Hospital Problem list     Assessment & Plan:  Iatrogenic Left Pneumothorax - moderate on post op CXR - currently hemodynamically stable P:  Left Wayne pneumothorax chest tube placed 08/05/2018 Changed to him to waterseal on 08/06/2018 with follow-up chest x-ray at 1400 hrs. No air leak was noted   Symptomatic bradycardia with sinus node dysfunction P:  Status post permanent pacemaker per cardiology  Disposition / Summary of Today's Plan 08/06/18   Chest tube placed to waterseal Chest x-ray ordered for 1400 hrs. on 08/06/2018 Chest x-ray also ordered for 08/07/2018 0 700   Diet: per Cards Pain/Anxiety/Delirium protocol (if indicated): n/a VAP protocol (if indicated): n/a DVT prophylaxis: SCDs GI prophylaxis: n/a Hyperglycemia protocol: n/a Mobility: BR Code Status: Full  Family Communication: Wife and two kids at bedside.   Labs   CBC: Recent Labs  Lab 08/03/18 1135  WBC 7.2  HGB 13.1  HCT 38.3  MCV 94  PLT 992    Basic Metabolic Panel: Recent Labs  Lab 08/03/18 1135  NA 141  K 5.1  CL 97  CO2 27  GLUCOSE 85  BUN 18  CREATININE 1.08  CALCIUM 9.8   GFR: Estimated Creatinine Clearance: 42.8 mL/min (by C-G formula based on SCr of 1.08 mg/dL). Recent Labs  Lab 08/03/18 1135  WBC 7.2    Liver Function Tests: No results for input(s): AST, ALT, ALKPHOS, BILITOT, PROT, ALBUMIN in the last 168 hours.  No results for input(s): LIPASE, AMYLASE in the last 168 hours. No results for input(s): AMMONIA in the last 168 hours.  ABG    Component Value Date/Time   TCO2 30 12/19/2011 1914     Coagulation Profile: Recent Labs  Lab 08/03/18 1135  INR 1.0    Cardiac Enzymes: No results for input(s): CKTOTAL, CKMB, CKMBINDEX, TROPONINI in the last 168 hours.  HbA1C: No results found for:  HGBA1C  CBG: No results for input(s): GLUCAP in the last 168 hours.  Admitting History of Present Illness.   [name] with past medical history significant for recently diagnosed rapidly progressed dementia, HTN, diastolic HF, aortic insufficiency, and Parkinson's who was admitted 11/6 for elective pacemaker placement for symptomatic bradycardia due to sinus node dysfunction.    Post-operatively was hemodynamically stable, however CXR noted for new left moderate sized pneumothorax.  Patient currently asymptomatic and hemodynamically stable.  Pulmonary consulted for further management.  On 08/05/2018 left Wayne pneumothorax chest tube placed per pulmonary critical care.   Richardson Landry Edwena Mayorga ACNP [facility] Pager [phone] till 1 pm If no answer page [phone] 08/06/2018, 10:17 AM

## 2018-08-06 NOTE — Progress Notes (Signed)
[facility] PCCM  CXR looks OK on waterseal Ordered removal of pigtail chest drain Repeat CXR after removal  Roselie Awkward MD

## 2018-08-06 NOTE — Progress Notes (Addendum)
Progress Note  Patient Name: Charles [name] Date of Encounter: 08/06/2018  Primary Cardiologist:  Sanda Klein, MD  Subjective   Breathing better but still weak. Hurts to take a deep breath. No problems w/ PPM site  Inpatient Medications    Scheduled Meds: . amLODipine  2.5 mg Oral Daily  . aspirin EC  81 mg Oral Daily  . carbidopa-levodopa  1 tablet Oral 3 times per day  . donepezil  10 mg Oral Daily  . feeding supplement (ENSURE ENLIVE)  237 mL Oral BID BM  . lisinopril  40 mg Oral BID  . pantoprazole  40 mg Oral Q0600   Continuous Infusions: .  ceFAZolin (ANCEF) IV 1 g (08/06/18 9242)   PRN Meds: acetaminophen, ketorolac, ondansetron (ZOFRAN) IV   Vital Signs    Vitals:   08/05/18 1830 08/05/18 1900 08/05/18 2000 08/06/18 0713  BP: 140/80 138/80 (!) 150/75 134/73  Pulse: 81 72 64 60  Resp: 16 15 19 17   Temp:   97.9 F (36.6 C) 98.3 F (36.8 C)  TempSrc:   Oral Oral  SpO2: 100% 100% 100% 100%  Weight:    52 kg  Height:        Intake/Output Summary (Last 24 hours) at 08/06/2018 0849 Last data filed at 08/06/2018 0805 Gross per 24 hour  Intake 540 ml  Output 875 ml  Net -335 ml   Filed Weights   08/05/18 1145 08/06/18 0713  Weight: 52.2 kg 52 kg    Telemetry    SR, A pacing, AV pacing, rare PVCs - Personally Reviewed  ECG    11/07, A pacing, HR 61 - Personally Reviewed  Physical Exam   General: Frail, elderly, male appearing in no acute distress. Head: Normocephalic, atraumatic.  Neck: Supple without bruits, JVD mild elevation. Lungs:  Resp regular and unlabored, generally clear. PPM site w/ dressing in place, small amount blood on dressing, marked Heart: RRR, S1, S2, no S3, S4, 2/6 murmur; no rub. Abdomen: Soft, non-tender, non-distended with normoactive bowel sounds. No hepatomegaly. No rebound/guarding. No obvious abdominal masses. Extremities: No clubbing, cyanosis, no edema. Distal pedal pulses are 2+ bilaterally. Neuro: Alert and  oriented X 3. Moves all extremities spontaneously. Psych: Normal affect.  Labs    Hematology Recent Labs  Lab 08/03/18 1135  WBC 7.2  RBC 4.09*  HGB 13.1  HCT 38.3  MCV 94  MCH 32.0  MCHC 34.2  RDW 12.5  PLT 204    Chemistry Recent Labs  Lab 08/03/18 1135  NA 141  K 5.1  CL 97  CO2 27  GLUCOSE 85  BUN 18  CREATININE 1.08  CALCIUM 9.8  GFRNONAA 66  GFRAA 77     Radiology-- 11/7 CXR pending    Dg Chest Port 1 View  Result Date: 08/05/2018 CLINICAL DATA:  Chest tube placement. EXAM: PORTABLE CHEST 1 VIEW COMPARISON:  Chest radiograph August 05, 2018 FINDINGS: Patent LEFT chest tube with minimal residual LEFT apical pneumothorax. Cardiomediastinal silhouette is normal. Mildly calcified aortic arch. No pleural effusion or focal consolidation. Soft tissue planes and included osseous structures are non suspicious. LEFT dual lead cardiac pacemaker in situ. IMPRESSION: LEFT chest tube with predominately re-expanded LEFT lung, minimal residual LEFT apical pneumothorax. Electronically Signed   By: Elon Alas M.D.   On: 08/05/2018 19:48   Dg Chest Port 1 View  Result Date: 08/05/2018 CLINICAL DATA:  Pneumothorax. EXAM: PORTABLE CHEST 1 VIEW COMPARISON:  [phone] FINDINGS: A left dual chamber pacemaker  is in place. A moderate-sized pneumothorax is present on the left. There is no mediastinal shift. Right lung is clear. No focal airspace disease is present. IMPRESSION: 1. Moderate left-sided pneumothorax following placement of new a chamber pacemaker. 2. Lead tips project over the right atrium and right ventricle. Electronically Signed   By: San Morelle M.D.   On: 08/05/2018 16:49     Cardiac Studies   PPM implant:  1. Implantation of new dual chamber permanent pacemaker 2. Fluoroscopy 3. Left upper extremity venography  Reason for procedure: Symptomatic bradycardia due to: Sinus node dysfunction Complications: No immediate complications. STAT chest  X-ray was ordered to evaluate for pneumothorax due to difficult access. Device detailsResearch scientist (life sciences). Jude Assurity MRI model 2272 serial number [id number] Right atrial lead St. Jude Tendril MRI [id number] serial number [id number] Right ventricular lead St. Jude Tendril MRI [id number] serial number [id number]  Patient Profile     77 y.o. male w/ hx SSS, HTN, AI, Parkinson's dz, dementia, diastolic dysfunction was admitted 11/06 for PPM insertion.  Assessment & Plan     Principal Problem: 1.  SSS (sick sinus syndrome) (HCC) - s/p St Jude dual lead PPM - make sure device is working well, no hematoma at site  Active Problems: 2.  Rapidly progressive dementia ([place]) and Parkinson's dz - continue home rx  3.  Pacemaker - see above   4.  Pneumothorax on left - s/p chest tube - management of this per CCM  Plan: no d/c today, mobilize once cleared by CCM   Signed, Rosaria Ferries , PA-C 8:49 AM 08/06/2018 Pager: [phone]  I have examined the patient and reviewed assessment and plan and discussed with patient.  Agree with above as stated.   TOlerating chest tube.  Pacer seems to be working well by tele.  Appreciate pulmonary management of chest tube.  D/w family at the bedside.  Larae Grooms

## 2018-08-06 NOTE — Evaluation (Signed)
Physical Therapy Evaluation Patient Details Name: Charles [name] MRN: [id number] DOB: 1940/10/28 Today's Date: 08/06/2018   History of Present Illness  Pt is a 77 y/o male s/p L sided pacemaker implant. Pt with L pneumothorax following pacemaker insertion and had chest tube placed. Pt had chest tube removed on 11/7. PMH includes dementia, HTN, dCHF, and parkinson's disease.   Clinical Impression  Pt admitted secondary to problem above with deficits below. MD cleared PT to see pt prior to x ray following chest tube removal. Pt with mild LOB initially during gait requiring min A for steadying assist. Otherwise required min guard A for mobility. Educated about use of cane to increase safety. Reviewed pacemaker precautions and pt maintained throughout. Feel pt would benefit from outpatient PT once cleared from precautions by MD to address higher level balance deficits. Will continue to follow acutely to maximize functional mobility independence and safety.     Follow Up Recommendations No PT follow up;Supervision for mobility/OOB    Equipment Recommendations  Cane    Recommendations for Other Services       Precautions / Restrictions Precautions Precautions: Fall;ICD/Pacemaker Precaution Comments: Reviewed pacemaker precautions with pt.  Required Braces or Orthoses: Sling Restrictions Weight Bearing Restrictions: Yes LUE Weight Bearing: Non weight bearing      Mobility  Bed Mobility Overal bed mobility: Needs Assistance Bed Mobility: Supine to Sit;Sit to Supine     Supine to sit: Supervision Sit to supine: Supervision   General bed mobility comments: Supervision for safety. Maintained precautions appropriately.   Transfers Overall transfer level: Needs assistance Equipment used: None Transfers: Sit to/from Stand Sit to Stand: Min guard         General transfer comment: Min guard for safety.   Ambulation/Gait Ambulation/Gait assistance: Min guard;Min assist Gait  Distance (Feet): 150 Feet Assistive device: 1 person hand held assist;None Gait Pattern/deviations: Step-through pattern;Decreased stride length Gait velocity: Decreased    General Gait Details: Pt with mild LOB anteriorly initially, and required min A for steadying assist. Min guard for steadying assist. Educated about use of cane to increase safety during gait.   Stairs            Wheelchair Mobility    Modified Rankin (Stroke Patients Only)       Balance Overall balance assessment: Needs assistance Sitting-balance support: No upper extremity supported;Feet supported Sitting balance-Leahy Scale: Good     Standing balance support: No upper extremity supported;During functional activity Standing balance-Leahy Scale: Fair                               Pertinent Vitals/Pain Pain Assessment: No/denies pain    Home Living Family/patient expects to be discharged to:: Private residence Living Arrangements: Spouse/significant other Available Help at Discharge: Family;Available 24 hours/day Type of Home: House Home Access: Stairs to enter Entrance Stairs-Rails: None Entrance Stairs-Number of Steps: 2 Home Layout: One level Home Equipment: Walker - 2 wheels;Toilet riser      Prior Function Level of Independence: Independent               Hand Dominance        Extremity/Trunk Assessment   Upper Extremity Assessment Upper Extremity Assessment: LUE deficits/detail LUE Deficits / Details: LUE in sling secondary to pacemaker precautions.    Lower Extremity Assessment Lower Extremity Assessment: Generalized weakness    Cervical / Trunk Assessment Cervical / Trunk Assessment: Normal  Communication   Communication: No  difficulties  Cognition Arousal/Alertness: Awake/alert Behavior During Therapy: WFL for tasks assessed/performed Overall Cognitive Status: History of cognitive impairments - at baseline                                  General Comments: Dementia at baseline. Followed precautions very well      General Comments General comments (skin integrity, edema, etc.): MD cleared to see without having chest xray after chest tube removal. VSS throughout.     Exercises     Assessment/Plan    PT Assessment Patient needs continued PT services  PT Problem List Decreased strength;Decreased balance;Decreased mobility;Decreased knowledge of use of DME       PT Treatment Interventions DME instruction;Gait training;Stair training;Functional mobility training;Therapeutic activities;Therapeutic exercise;Balance training;Patient/family education    PT Goals (Current goals can be found in the Care Plan section)  Acute Rehab PT Goals Patient Stated Goal: to go home PT Goal Formulation: With patient Time For Goal Achievement: 08/20/18 Potential to Achieve Goals: Good    Frequency Min 3X/week   Barriers to discharge        Co-evaluation               AM-PAC PT "6 Clicks" Daily Activity  Outcome Measure Difficulty turning over in bed (including adjusting bedclothes, sheets and blankets)?: A Little Difficulty moving from lying on back to sitting on the side of the bed? : A Little Difficulty sitting down on and standing up from a chair with arms (e.g., wheelchair, bedside commode, etc,.)?: Unable Help needed moving to and from a bed to chair (including a wheelchair)?: A Little Help needed walking in hospital room?: A Little Help needed climbing 3-5 steps with a railing? : A Lot 6 Click Score: 15    End of Session Equipment Utilized During Treatment: Gait belt;Other (comment)(sling) Activity Tolerance: Patient tolerated treatment well Patient left: in bed;with call bell/phone within reach;with family/visitor present Nurse Communication: Mobility status PT Visit Diagnosis: Unsteadiness on feet (R26.81);Other abnormalities of gait and mobility (R26.89)    Time: 1829-9371 PT Time Calculation (min) (ACUTE  ONLY): 18 min   Charges:   PT Evaluation $PT Eval Low Complexity: [place], PT, DPT  Acute Rehabilitation Services  Pager: [phone] Office: [phone]   [name] 08/06/2018, 5:43 PM

## 2018-08-07 ENCOUNTER — Ambulatory Visit (HOSPITAL_COMMUNITY): Payer: Medicare Other

## 2018-08-07 DIAGNOSIS — N342 Other urethritis: Secondary | ICD-10-CM | POA: Diagnosis not present

## 2018-08-07 DIAGNOSIS — Z4682 Encounter for fitting and adjustment of non-vascular catheter: Secondary | ICD-10-CM

## 2018-08-07 DIAGNOSIS — I959 Hypotension, unspecified: Secondary | ICD-10-CM | POA: Diagnosis not present

## 2018-08-07 DIAGNOSIS — R079 Chest pain, unspecified: Secondary | ICD-10-CM | POA: Diagnosis not present

## 2018-08-07 DIAGNOSIS — Z452 Encounter for adjustment and management of vascular access device: Secondary | ICD-10-CM | POA: Diagnosis not present

## 2018-08-07 DIAGNOSIS — I495 Sick sinus syndrome: Secondary | ICD-10-CM | POA: Diagnosis not present

## 2018-08-07 DIAGNOSIS — J95811 Postprocedural pneumothorax: Secondary | ICD-10-CM | POA: Diagnosis not present

## 2018-08-07 DIAGNOSIS — J939 Pneumothorax, unspecified: Secondary | ICD-10-CM | POA: Diagnosis not present

## 2018-08-07 DIAGNOSIS — G3183 Dementia with Lewy bodies: Secondary | ICD-10-CM | POA: Diagnosis not present

## 2018-08-07 DIAGNOSIS — D649 Anemia, unspecified: Secondary | ICD-10-CM | POA: Diagnosis not present

## 2018-08-07 DIAGNOSIS — T82897A Other specified complication of cardiac prosthetic devices, implants and grafts, initial encounter: Secondary | ICD-10-CM | POA: Diagnosis not present

## 2018-08-07 DIAGNOSIS — I309 Acute pericarditis, unspecified: Secondary | ICD-10-CM | POA: Diagnosis not present

## 2018-08-07 DIAGNOSIS — I472 Ventricular tachycardia: Secondary | ICD-10-CM | POA: Diagnosis not present

## 2018-08-07 MED ORDER — CEFDINIR 300 MG PO CAPS: 300.0000 mg | ORAL_CAPSULE | Freq: Two times a day (BID) | ORAL | Status: DC

## 2018-08-07 MED ORDER — SODIUM CHLORIDE 0.9 % IV SOLN
1.0000 g | INTRAVENOUS | Status: AC
  Administered 2018-08-07: 1 g via INTRAVENOUS
  Filled 2018-08-07: qty 10

## 2018-08-07 MED ORDER — CEFDINIR 300 MG PO CAPS: 300.0000 mg | ORAL_CAPSULE | Freq: Two times a day (BID) | ORAL | 0 refills | Status: DC

## 2018-08-07 NOTE — Progress Notes (Addendum)
NAME:  Charles [name], MRN:  [id number], DOB:  December 14, 1940, LOS: 0 ADMISSION DATE:  08/05/2018, CONSULTATION DATE:  08/05/2018 REFERRING MD:  Dr. Sallyanne Kuster, CHIEF COMPLAINT:  Left pneumothorax  Brief History   37 yoM with left pneumothorax after PPM today for symptomatic bradycardia due to sinus node dysfunction.    Past Medical History  Dementia, HTN, diastolic HF, aortic insufficiency, Parkinson's  Significant Hospital Events   11/6- PPM placed/ admit 08/05/2018 left pneumothorax status post permanent pacemaker insertion 04/04/2018 left Wayne pneumothorax tube placed per pulmonary critical care  Consults: date of consult/date signed off & final recs:  11/6 PCCM   Procedures (surgical and bedside):  11/6 PPM placed by Cards  Significant Diagnostic Tests:  11/6 CXR  >> 1. Moderate left-sided pneumothorax following placement of new a chamber pacemaker. 2. Lead tips project over the right atrium and right ventricle. 08/06/2018 chest x-ray reveals small apical pneumothorax, chest x-ray reviewed by me.  Chest tube in good position Chest x-ray ordered for 1400 08/06/2018>> with small apical pneumothorax left 11/8 repeat cxr nsc in pnx  Micro Data:  11/6 MRSA PCR >> neg  Antimicrobials:  11/6 cefazolin pre-op  Subjective:  No acute respiratory distress  Objective   Blood pressure 120/66, pulse 63, temperature 97.7 F (36.5 C), temperature source Oral, resp. rate 13, height 5\' 9"  (1.753 m), weight 50.1 kg, SpO2 100 %.        Intake/Output Summary (Last 24 hours) at 08/07/2018 1023 Last data filed at 08/07/2018 0930 Gross per 24 hour  Intake 480 ml  Output 1550 ml  Net -1070 ml   Filed Weights   08/05/18 1145 08/06/18 0713 08/07/18 0630  Weight: 52.2 kg 52 kg 50.1 kg    Examination: General: Frail thin cachectic male no acute distress complains of pain with urination HEENT: No lymphadenopathy is appreciated Neuro: Dull effect with follows commands moves all  extremities CV: s1s2 rrr, no m/r/g PULM: even/non-labored, lungs bilaterally left chest tube insertion site dressing is dry and intact ZD:GUYQ, non-tender, bsx4 active  GU: Planes of pain on urination noted to have hematuria reported to have ulcer on the glans penis Extremities: warm/dry, negative edema  Skin: no rashes or lesions   Resolved Hospital Problem list   Postoperative pneumothorax with almost near resolution  Assessment & Plan:  Iatrogenic Left Pneumothorax - moderate on post op CXR - currently hemodynamically stable P:  Left Wayne pneumothorax chest tube placed 08/05/2018 Changed to him to [place] on 08/06/2018 with follow-up chest x-ray at 1400 hrs. Chest tube removed on 08/06/2018 Chest x-ray 08/06/2018 showed small apical pneumothorax Chest x-ray ordered for 08/07/2018 for completeness concerning still shows small apical pneumothorax. [place] on repeat cxr  Symptomatic bradycardia with sinus node dysfunction P:  Permanent pacemaker per cardiology  Disposition / Summary of Today's Plan 08/07/18   Chest tube was discontinued on 08/06/2018 Chest x-ray showed small apical pneumothorax Seen on 08/07/2018 showed no acute respiratory distress on room air. Chest x-ray ordered for 08/07/2017 for completeness   Diet: per Cards Pain/Anxiety/Delirium protocol (if indicated): n/a VAP protocol (if indicated): n/a DVT prophylaxis: SCDs GI prophylaxis: n/a Hyperglycemia protocol: n/a Mobility: BR Code Status: Full  Family Communication: Wife and two kids at bedside.   Labs   CBC: Recent Labs  Lab 08/03/18 1135  WBC 7.2  HGB 13.1  HCT 38.3  MCV 94  PLT 034    Basic Metabolic Panel: Recent Labs  Lab 08/03/18 1135  NA 141  K 5.1  CL 97  CO2 27  GLUCOSE 85  BUN 18  CREATININE 1.08  CALCIUM 9.8   GFR: Estimated Creatinine Clearance: 41.2 mL/min (by C-G formula based on SCr of 1.08 mg/dL). Recent Labs  Lab 08/03/18 1135  WBC 7.2    Liver Function Tests: No  results for input(s): AST, ALT, ALKPHOS, BILITOT, PROT, ALBUMIN in the last 168 hours. No results for input(s): LIPASE, AMYLASE in the last 168 hours. No results for input(s): AMMONIA in the last 168 hours.  ABG    Component Value Date/Time   TCO2 30 12/19/2011 1914     Coagulation Profile: Recent Labs  Lab 08/03/18 1135  INR 1.0    Cardiac Enzymes: No results for input(s): CKTOTAL, CKMB, CKMBINDEX, TROPONINI in the last 168 hours.  HbA1C: No results found for: HGBA1C  CBG: No results for input(s): GLUCAP in the last 168 hours.  Admitting History of Present Illness.   4 yoM with past medical history significant for recently diagnosed rapidly progressed dementia, HTN, diastolic HF, aortic insufficiency, and Parkinson's who was admitted 11/6 for elective pacemaker placement for symptomatic bradycardia due to sinus node dysfunction.    Post-operatively was hemodynamically stable, however CXR noted for new left moderate sized pneumothorax.  Patient currently asymptomatic and hemodynamically stable.  Pulmonary consulted for further management.  On 08/05/2018 left Wayne pneumothorax chest tube placed per pulmonary critical care.  08/06/2018 left chest tube was removed subsequent chest x-ray revealed small apical pneumothorax.  Pulmonary critical care will sign off at this time.    Richardson Landry Jamaine Quintin ACNP Maryanna Shape PCCM Pager [phone] till 1 pm If no answer page [phone] 08/07/2018, 10:23 AM

## 2018-08-07 NOTE — Consult Note (Signed)
Urology Consult   Physician requesting consult: Daune Perch, NP  Reason for consult: Bloody urethral discharge  History of Present Illness: Charles [name] is a 77 y.o. male with PMH significant for dementia, anxiety, aortic regurgitation, SSS, and HTN who was admitted on 08/05/18 for placement of a duel chamber pacemaker. During his hospitalization he had a condom cath in place.  Earlier today he developed dysuria and bloody urethral discharge.  He states he is able to void without difficulty and feel he empties his bladder each time.   He denies a history of voiding or storage urinary symptoms, hematuria, UTIs, STDs, urolithiasis, GU malignancy/trauma/surgery.  He is currently resting comfortably and denies HA, CP, SOB, N/V, diarrhea/constipation, back pain, and flank pain.   Past Medical History:  Diagnosis Date  . Anxiety   . Aortic regurgitation   . Complication of anesthesia   . Depression   . Diastolic dysfunction   . Hypertension   . 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 SSS (sick sinus syndrome) ([place])     Past Surgical History:  Procedure Laterality Date  . acromioclavicular separation  07/04/2016  . CHEST TUBE INSERTION Left 08/05/2018  . EYE SURGERY    . HERNIA REPAIR    . NM MYOVIEW LTD    . PACEMAKER IMPLANT N/A 08/05/2018   Procedure: St Jude dual lead PACEMAKER IMPLANT;  Surgeon: Sanda Klein, MD;  Location: [facility];  Service: Cardiovascular;  Laterality: N/A;  . US ECHOCARDIOGRAPHY      Current Hospital Medications:  Home Meds:  . Current Meds  Medication Sig  . amLODipine (NORVASC) 2.5 MG tablet Take 2.5 mg by mouth daily.  Marland Kitchen aspirin EC 81 MG tablet Take 81 mg by mouth daily.  . carbidopa-levodopa (SINEMET IR) 25-100 MG tablet Take 1 tablet by mouth 3 (three) times daily. (0830, 1330, & 2030)  . donepezil (ARICEPT) 10 MG tablet Take 10 mg by mouth daily.   Marland Kitchen escitalopram (LEXAPRO) 10 MG  tablet Take 10 mg by mouth at bedtime.   Marland Kitchen L-Methylfolate-B12-B6-B2 (CEREFOLIN) 02-28-49-5 MG TABS Take 1 tablet by mouth daily.   . Multiple Vitamin (MULTIVITAMIN WITH MINERALS) TABS tablet Take 1 tablet by mouth daily.  . quinapril (ACCUPRIL) 40 MG tablet Take 40 mg by mouth 2 (two) times daily.     Scheduled Meds: . amLODipine  2.5 mg Oral Daily  . aspirin EC  81 mg Oral Daily  . carbidopa-levodopa  1 tablet Oral 3 times per day  . [START ON 08/08/2018] cefdinir  300 mg Oral Q12H  . donepezil  10 mg Oral Daily  . feeding supplement (ENSURE ENLIVE)  237 mL Oral BID BM  . lisinopril  40 mg Oral BID  . pantoprazole  40 mg Oral Q0600   Continuous Infusions: . cefTRIAXone (ROCEPHIN)  IV     PRN Meds:.acetaminophen, ketorolac, ondansetron (ZOFRAN) IV  Allergies:  Allergies  Allergen Reactions  . Propofol Anaphylaxis    Family History  Problem Relation Age of Onset  . Hypertension Mother   . Lung cancer Mother   . Diabetes Mother   . Thyroid disease Mother   . Angina Father   . Heart attack Father   . Heart disease Brother   . Diabetes Brother   . Heart attack Brother   . Asthma Maternal Grandmother   . Stroke Maternal Grandfather   . Diabetes Paternal Grandfather   . Heart Problems Brother   .  Dementia Neg Hx     Social History:  reports that he has never smoked. He has never used smokeless tobacco. He reports that he does not drink alcohol or use drugs.  ROS: A complete review of systems was performed.  All systems are negative except for pertinent findings as noted.  Physical Exam:  Vital signs in last 24 hours: Temp:  [97.4 F (36.3 C)-98.3 F (36.8 C)] 98.3 F (36.8 C) (11/08 1509) Pulse Rate:  [59-65] 65 (11/08 1509) Resp:  [12-22] 22 (11/08 1509) BP: (87-120)/(41-67) 113/46 (11/08 1509) SpO2:  [99 %-100 %] 99 % (11/08 1509) Weight:  [50.1 kg] 50.1 kg (11/08 0630) Constitutional:  Alert and oriented, No acute distress Cardiovascular: Regular rate and  rhythm Respiratory: Normal respiratory effort GI: Abdomen is soft, nontender, nondistended, no abdominal masses GU: uncircum with bright red blood at meatus and on wash cloth over groin; foreskin easily retracted and replaced Lymphatic: No lymphadenopathy Neurologic: Grossly intact, no focal deficits Psychiatric: Normal mood and affect  Laboratory Data:  Lab Results  Component Value Date   WBC 7.2 08/03/2018   HGB 13.1 08/03/2018   HCT 38.3 08/03/2018   MCV 94 08/03/2018   PLT 204 08/03/2018   Lab Results  Component Value Date   CREATININE 1.08 08/03/2018   CREATININE 1.10 03/26/2018   CREATININE 1.06 10/14/2017      Recent Results (from the past 240 hour(s))  Surgical PCR screen     Status: None   Collection Time: 08/05/18 12:34 PM  Result Value Ref Range Status   MRSA, PCR NEGATIVE NEGATIVE Final   Staphylococcus aureus NEGATIVE NEGATIVE Final    Comment: (NOTE) The Xpert SA Assay (FDA approved for NASAL specimens in patients 62 years of age and older), is one component of a comprehensive surveillance program. It is not intended to diagnose infection nor to guide or monitor treatment. Performed at [facility], [place] [address], [place], [place] [address]     Renal Function: Recent Labs    08/03/18 1135  CREATININE 1.08   Estimated Creatinine Clearance: 41.2 mL/min (by C-G formula based on SCr of 1.08 mg/dL).  Radiologic Imaging: Dg Chest 2 View  Result Date: 08/06/2018 CLINICAL DATA:  Pneumothorax and pacemaker.  Soreness. EXAM: CHEST - 2 VIEW COMPARISON:  08/05/2018. FINDINGS: Unchanged cardiomediastinal silhouette and clear lung fields. Stable dual lead pacemaker. Small LEFT apical pneumothorax is redemonstrated. Pigtail catheter in the LEFT hemithorax is unchanged. IMPRESSION: Stable small LEFT apical pneumothorax. Electronically Signed   By: Staci Righter M.D.   On: 08/06/2018 09:25   Dg Chest Port 1 View  Result Date: 08/07/2018 CLINICAL DATA:   Status post pacemaker.  Followup pneumothorax. EXAM: PORTABLE CHEST 1 VIEW COMPARISON:  08/06/2018 FINDINGS: Minimal left apical pneumothorax, stable from the previous day's exam. Lungs are hyperexpanded but clear. Stable left anterior chest wall sequential pacemaker IMPRESSION: 1. Minimal left apical pneumothorax unchanged from the previous day's exam. Electronically Signed   By: Lajean Manes M.D.   On: 08/07/2018 11:01   Dg Chest Port 1 View  Result Date: 08/06/2018 CLINICAL DATA:  Chest tube removal EXAM: PORTABLE CHEST 1 VIEW COMPARISON:  08/06/2018, 08/05/2018, CT 10/17/2017 FINDINGS: Removal of left-sided chest tube. Minimal increase in size of a trace left apical pneumothorax. Left-sided pacing device as before. No acute airspace disease. Normal heart size. IMPRESSION: Removal of left-sided chest tube. Minimal increase in size of a tiny left apical pneumothorax. Electronically Signed   By: Madie Reno.D.  On: 08/06/2018 20:05   Dg Chest Port 1 View  Result Date: 08/06/2018 CLINICAL DATA:  Pneumothorax, chest tube removed. Hx of aortic regurgitation, hypertension, diastolic dysfunction, [id number](37/1/06). EXAM: PORTABLE CHEST 1 VIEW COMPARISON:  08/06/2017 FINDINGS: Lungs are mildly hyperinflated. The heart size is normal. The patient has a LEFT-sided transvenous pacemaker with leads to the RIGHT atrium and RIGHT ventricle. A pigtail type pleural catheter is identified on the LEFT, unchanged in position. There is trace LEFT apical pneumothorax, slightly smaller compared with the previous study. Lungs are clear. No edema. IMPRESSION: Smaller LEFT apical pneumothorax. Electronically Signed   By: Nolon Nations M.D.   On: 08/06/2018 15:17   Dg Chest Port 1 View  Result Date: 08/05/2018 CLINICAL DATA:  Chest tube placement. EXAM: PORTABLE CHEST 1 VIEW COMPARISON:  Chest radiograph August 05, 2018 FINDINGS: Patent LEFT chest tube with minimal residual LEFT apical pneumothorax.  Cardiomediastinal silhouette is normal. Mildly calcified aortic arch. No pleural effusion or focal consolidation. Soft tissue planes and included osseous structures are non suspicious. LEFT dual lead cardiac pacemaker in situ. IMPRESSION: LEFT chest tube with predominately re-expanded LEFT lung, minimal residual LEFT apical pneumothorax. Electronically Signed   By: Elon Alas M.D.   On: 08/05/2018 19:48     Impression/Recommendation  Urethritis--likely due to infection from condom cath.  Give Rocephin IM now and prescribe Bactrim or Ceftin for 5-7 days.  Advised pt and wife that he could use OTC Azo to see if it gives him some relief of the dysuria until the infection clears. Also advised he drink plenty of water (within parameters set by cards) to keep bladder flushed, prevent any clot formation, and keep urine more basic in attempt to decrease burning with urination.   Pt will need f/u appt in our office in 1-2 weeks to ensure resolution.   Debbrah Alar 08/07/2018, 4:45 PM

## 2018-08-07 NOTE — Progress Notes (Addendum)
Patient exhibiting mild hematuria in urine collection bag from external condom catheter this morning.  Catheter removed and urine output observed.  Initial void post removal appeared less pink, but has since returned to pink with slow continuous ooze of blood from meatus throughout the day.  Dr. Irish Lack and Pecolia Ades, NP informed.  Patient's wife and daughter kept continuously updated throughout the day. Per Ms Phylliss Bob, current plan is to treat with ABX for possible urethritis; urology to see patient this evening for input on treatment and discharge plan.

## 2018-08-07 NOTE — Progress Notes (Addendum)
Progress Note  Patient Name: Charles [name] Date of Encounter: 08/07/2018  Primary Cardiologist: Sanda Klein, MD   Subjective   Patient has mild tenderness at the pacemaker site but otherwise no chest pain and he denies any dyspnea.  He has not been up today but was up with physical therapy yesterday without any issues.  Inpatient Medications    Scheduled Meds: . amLODipine  2.5 mg Oral Daily  . aspirin EC  81 mg Oral Daily  . carbidopa-levodopa  1 tablet Oral 3 times per day  . donepezil  10 mg Oral Daily  . feeding supplement (ENSURE ENLIVE)  237 mL Oral BID BM  . lisinopril  40 mg Oral BID  . pantoprazole  40 mg Oral Q0600   Continuous Infusions:  PRN Meds: acetaminophen, ketorolac, ondansetron (ZOFRAN) IV   Vital Signs    Vitals:   08/06/18 2238 08/07/18 0251 08/07/18 0624 08/07/18 0630  BP: (!) 94/55 114/67 (!) 115/59   Pulse: 62 63 60   Resp: 18 12 13    Temp:   97.8 F (36.6 C)   TempSrc:   Oral   SpO2: 99% 100% 99%   Weight:    50.1 kg  Height:        Intake/Output Summary (Last 24 hours) at 08/07/2018 0747 Last data filed at 08/07/2018 0600 Gross per 24 hour  Intake 720 ml  Output 1350 ml  Net -630 ml   Filed Weights   08/05/18 1145 08/06/18 0713 08/07/18 0630  Weight: 52.2 kg 52 kg 50.1 kg    Telemetry    Atrial pacing at 60- Personally Reviewed  ECG    No new tracings- Personally Reviewed  Physical Exam   GEN:  Very thin, frail, elderly male, no acute distress.   Neck: No JVD Cardiac: RRR, no murmurs, rubs, or gallops.  Respiratory: Clear to auscultation bilaterally. GI: Soft, nontender, non-distended  MS: No edema; No deformity. Neuro:  Nonfocal, pinpoint pupils Psych: Normal affect   Labs    Chemistry Recent Labs  Lab 08/03/18 1135  NA 141  K 5.1  CL 97  CO2 27  GLUCOSE 85  BUN 18  CREATININE 1.08  CALCIUM 9.8  GFRNONAA 66  GFRAA 77     Hematology Recent Labs  Lab 08/03/18 1135  WBC 7.2  RBC 4.09*  HGB  13.1  HCT 38.3  MCV 94  MCH 32.0  MCHC 34.2  RDW 12.5  PLT 204    Cardiac EnzymesNo results for input(s): TROPONINI in the last 168 hours. No results for input(s): TROPIPOC in the last 168 hours.   BNPNo results for input(s): BNP, PROBNP in the last 168 hours.   DDimer No results for input(s): DDIMER in the last 168 hours.   Radiology    Dg Chest 2 View  Result Date: 08/06/2018 CLINICAL DATA:  Pneumothorax and pacemaker.  Soreness. EXAM: CHEST - 2 VIEW COMPARISON:  08/05/2018. FINDINGS: Unchanged cardiomediastinal silhouette and clear lung fields. Stable dual lead pacemaker. Small LEFT apical pneumothorax is redemonstrated. Pigtail catheter in the LEFT hemithorax is unchanged. IMPRESSION: Stable small LEFT apical pneumothorax. Electronically Signed   By: Staci Righter M.D.   On: 08/06/2018 09:25   Dg Chest Port 1 View  Result Date: 08/06/2018 CLINICAL DATA:  Chest tube removal EXAM: PORTABLE CHEST 1 VIEW COMPARISON:  08/06/2018, 08/05/2018, CT 10/17/2017 FINDINGS: Removal of left-sided chest tube. Minimal increase in size of a trace left apical pneumothorax. Left-sided pacing device as before. No acute  airspace disease. Normal heart size. IMPRESSION: Removal of left-sided chest tube. Minimal increase in size of a tiny left apical pneumothorax. Electronically Signed   By: Donavan Foil M.D.   On: 08/06/2018 20:05   Dg Chest Port 1 View  Result Date: 08/06/2018 CLINICAL DATA:  Pneumothorax, chest tube removed. Hx of aortic regurgitation, hypertension, diastolic dysfunction, ZOXWRUEAV(40/9/81). EXAM: PORTABLE CHEST 1 VIEW COMPARISON:  08/06/2017 FINDINGS: Lungs are mildly hyperinflated. The heart size is normal. The patient has a LEFT-sided transvenous pacemaker with leads to the RIGHT atrium and RIGHT ventricle. A pigtail type pleural catheter is identified on the LEFT, unchanged in position. There is trace LEFT apical pneumothorax, slightly smaller compared with the previous study. Lungs  are clear. No edema. IMPRESSION: Smaller LEFT apical pneumothorax. Electronically Signed   By: Nolon Nations M.D.   On: 08/06/2018 15:17   Dg Chest Port 1 View  Result Date: 08/05/2018 CLINICAL DATA:  Chest tube placement. EXAM: PORTABLE CHEST 1 VIEW COMPARISON:  Chest radiograph August 05, 2018 FINDINGS: Patent LEFT chest tube with minimal residual LEFT apical pneumothorax. Cardiomediastinal silhouette is normal. Mildly calcified aortic arch. No pleural effusion or focal consolidation. Soft tissue planes and included osseous structures are non suspicious. LEFT dual lead cardiac pacemaker in situ. IMPRESSION: LEFT chest tube with predominately re-expanded LEFT lung, minimal residual LEFT apical pneumothorax. Electronically Signed   By: Elon Alas M.D.   On: 08/05/2018 19:48   Dg Chest Port 1 View  Result Date: 08/05/2018 CLINICAL DATA:  Pneumothorax. EXAM: PORTABLE CHEST 1 VIEW COMPARISON:  [id number] FINDINGS: A left dual chamber pacemaker is in place. A moderate-sized pneumothorax is present on the left. There is no mediastinal shift. Right lung is clear. No focal airspace disease is present. IMPRESSION: 1. Moderate left-sided pneumothorax following placement of new a chamber pacemaker. 2. Lead tips project over the right atrium and right ventricle. Electronically Signed   By: San Morelle M.D.   On: 08/05/2018 16:49    Cardiac Studies   PACEMAKER IMPLANT  08/05/18  Conclusion   1. Implantation of new dual chamber permanent pacemaker 2. Fluoroscopy 3. Left upper extremity venography  Reason for procedure: Symptomatic bradycardia due to: Sinus node dysfunction  Procedure performed by: Sanda Klein, MD  Complications: No immediate complications. STAT chest X-ray was ordered to evaluate for pneumothorax due to difficult access.  Estimated blood loss: 50 mL  Device details: Generator St. Jude Assurity MRI model 2272 serial number [id number] Right atrial lead  St. Jude Tendril MRI [id number] serial number [id number] Right ventricular lead St. Jude Tendril MRI [id number] serial number [id number]    Patient Profile     77 y.o. male w/ hx SSS, HTN, AI, Parkinson's dz, dementia, diastolic dysfunction was admitted 11/06 for PPM insertion.  Assessment & Plan    1. SSS (Sick Sinus Syndrome) -S/P St Jude dual lead PPM 08/05/18 -PPM checked by Dr. Sallyanne Kuster on 11/7: Pacemaker lead parameters are in acceptable range. CXR shows normal device configuration. -Scant SS drainage on dressing over PPM site -Atrial pacing at 60 on tele -probable discharge today.  We will have Dr. Irish Lack  review chest x-ray.  Patient has been up with physical therapy.  His wife feels that the patient is safe for discharge and she can care for him at home.  He is usually fully independent at home per his wife.  2. Left pneumothorax -Pt had chest tube for a day, now dc'd.  -CXR last night showed minimal increase in size of tiny  left apical pneumothorax.  Followed by PCCM.  -No respiratory distress.   3. Rapidly progressive dementia and Parkinson's Disease -continue home medical therapy.  4.  Hypertension -BP is stable on his home antihypertensive regimen  For questions or updates, please contact [place] Please consult [email or web address] for contact info under   Signed, Daune Perch, NP  08/07/2018, 7:47 AM    I have examined the patient and reviewed assessment and plan and discussed with patient.  Agree with above as stated.  Await pulmonary recs to see if he is stable for discharge from their perspective.  Mild drainage noted at device site.  No swelling at the site.  Blood in his urine noted.  May be related to trauma from the catheter. Will recheck once catheter is off.  May need f/u u/a as an outpatient.   Possible discharge later today.  Pacer appears to be functioning well.   Larae Grooms

## 2018-08-07 NOTE — Discharge Summary (Addendum)
Discharge Summary    Patient ID: Charles [name] MRN: [id number]; DOB: December 18, 1940  Admit date: 08/05/2018 Discharge date: 08/07/2018  Primary Care Provider: Jani Gravel, MD  Primary Cardiologist: Sanda Klein, MD  Primary Electrophysiologist:  None   Discharge Diagnoses    Principal Problem:   SSS (sick sinus syndrome) [facility]) Active Problems:   Rapidly progressive dementia [facility])   Pacemaker   Pneumothorax on left   Encounter for chest tube removal   Urethritis   Allergies Allergies  Allergen Reactions  . Propofol Anaphylaxis    Diagnostic Studies/Procedures    PACEMAKER IMPLANT  08/05/18  Conclusion   1. Implantation of new dual chamber permanent pacemaker 2. Fluoroscopy 3. Left upper extremity venography  Reason for procedure: Symptomatic bradycardia due to: Sinus node dysfunction  Procedure performed by: Sanda Klein, MD  Complications: No immediate complications. STAT chest X-ray was ordered to evaluate for pneumothorax due to difficult access.  Estimated blood loss: 50 mL  Device details: Generator St. Jude Assurity MRI model 2272 serial number [id number] Right atrial lead St. Jude Tendril MRI [id number] serial number [id number] Right ventricular lead St. Jude Tendril MRI [id number] serial number [id number]    _____________   History of Present Illness     Charles [name] is a 77 y.o. male with a hx of rapidly progressive dementia, hypertension, history of diastolic dysfunction and aortic insufficiency, Parkinson's.  Although this is the first time I am seeing the patient, I have known him for years since his wife is also my patient.  Charles [name] had a rapid cognitive decline over the last couple of years and there is a suspicion he may have Lewy body dementia, although the diagnosis is not yet clear.  According to the family and based on mild peripheral observations, he has improved substantially after he started treatment with donepezil.  He is  much more engaged and interactive than he was at [place] last clinic appointment.  He has had fatigue for years, this is recently worse.  When he saw Vassie Moment at [facility] he was severely bradycardic and he is referred here to discuss further treatment.  He has never experienced syncope.  He denies exertional angina or dyspnea.  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  Short-term memory loss has been a pervasive problem.  His Father and brother both had pacemakers.  He was arranged for pacemaker implantation for symptomatic bradycardia, sinus node dysfunction.   Hospital Course     Consultants: Dr. Lake Bells, Pulmonary  Charles [name] underwent implantation of a St. Jude dual chamber permanent pacemaker. Post-procedure CXR showed a moderate L pneumothorax after pacemaker implantation. Pt was asymptomatic with stable oxygen levels. He was seen by pulmonary, Dr. Lake Bells and a chest tube was placed for approximately 1 day. It was removed yesterday and CXR today shows small residual apical pneumothorax which is not getting bigger and not associated with symptoms. Per Dr. Lake Bells, [place] for discharge and will not need repeat CXR unless he develops symptoms.   PPM checked by Dr. Sallyanne Kuster on 11/7: Pacemaker lead parameters are in acceptable range. CXR shows normal device configuration. Scant SS drainage on dressing over PPM site. Atrial pacing at 60 on tele.  Patient has been up with physical therapy.  His wife feels that the patient is safe for discharge and she can care for him at home.  He is usually fully independent at home per his wife.  Pt has Rapidly progressive dementia and  Parkinson's Disease. Continue home medical therapy.  Pt has has some blood oozing from the meatus. Urology evaluated and suspected ecoli urethritis felt to be 2/2 condom cath use. Recommended Ceftriaxone 1g x1 dose, followed by cefdinir 300mg  BID for 5 days.  Patient to follow-up outpatient with Urology in 1-2 weeks.   Patient has been seen by Dr. Irish Lack today and deemed ready for discharge home. All follow up appointments have been scheduled. Discharge medications are listed below.  _____________  Discharge Vitals Blood pressure (!) 113/46, pulse 65, temperature 98.3 F (36.8 C), temperature source Oral, resp. rate (!) 22, height 5\' 9"  (1.753 m), weight 50.1 kg, SpO2 99 %.  Filed Weights   08/05/18 1145 08/06/18 0713 08/07/18 0630  Weight: 52.2 kg 52 kg 50.1 kg    Labs & Radiologic Studies    CBC No results for input(s): WBC, NEUTROABS, HGB, HCT, MCV, PLT in the last 72 hours. Basic Metabolic Panel No results for input(s): NA, K, CL, CO2, GLUCOSE, BUN, CREATININE, CALCIUM, MG, PHOS in the last 72 hours. Liver Function Tests No results for input(s): AST, ALT, ALKPHOS, BILITOT, PROT, ALBUMIN in the last 72 hours. No results for input(s): LIPASE, AMYLASE in the last 72 hours. Cardiac Enzymes No results for input(s): CKTOTAL, CKMB, CKMBINDEX, TROPONINI in the last 72 hours. BNP Invalid input(s): POCBNP D-Dimer No results for input(s): DDIMER in the last 72 hours. Hemoglobin A1C No results for input(s): HGBA1C in the last 72 hours. Fasting Lipid Panel No results for input(s): CHOL, HDL, LDLCALC, TRIG, CHOLHDL, LDLDIRECT in the last 72 hours. Thyroid Function Tests No results for input(s): TSH, T4TOTAL, T3FREE, THYROIDAB in the last 72 hours.  Invalid input(s): FREET3 _____________  Dg Chest 2 View  Result Date: 08/06/2018 CLINICAL DATA:  Pneumothorax and pacemaker.  Soreness. EXAM: CHEST - 2 VIEW COMPARISON:  08/05/2018. FINDINGS: Unchanged cardiomediastinal silhouette and clear lung fields. Stable dual lead pacemaker. Small LEFT apical pneumothorax is redemonstrated. Pigtail catheter in the LEFT hemithorax is unchanged. IMPRESSION: Stable small LEFT apical pneumothorax. Electronically Signed   By: Staci Righter M.D.   On: 08/06/2018  09:25   Dg Chest Port 1 View  Result Date: 08/07/2018 CLINICAL DATA:  Status post pacemaker.  Followup pneumothorax. EXAM: PORTABLE CHEST 1 VIEW COMPARISON:  08/06/2018 FINDINGS: Minimal left apical pneumothorax, stable from the previous day's exam. Lungs are hyperexpanded but clear. Stable left anterior chest wall sequential pacemaker IMPRESSION: 1. Minimal left apical pneumothorax unchanged from the previous day's exam. Electronically Signed   By: Lajean Manes M.D.   On: 08/07/2018 11:01   Dg Chest Port 1 View  Result Date: 08/06/2018 CLINICAL DATA:  Chest tube removal EXAM: PORTABLE CHEST 1 VIEW COMPARISON:  08/06/2018, 08/05/2018, CT 10/17/2017 FINDINGS: Removal of left-sided chest tube. Minimal increase in size of a trace left apical pneumothorax. Left-sided pacing device as before. No acute airspace disease. Normal heart size. IMPRESSION: Removal of left-sided chest tube. Minimal increase in size of a tiny left apical pneumothorax. Electronically Signed   By: Donavan Foil M.D.   On: 08/06/2018 20:05   Dg Chest Port 1 View  Result Date: 08/06/2018 CLINICAL DATA:  Pneumothorax, chest tube removed. Hx of aortic regurgitation, hypertension, diastolic dysfunction, SHFWYOVZC(58/8/50). EXAM: PORTABLE CHEST 1 VIEW COMPARISON:  08/06/2017 FINDINGS: Lungs are mildly hyperinflated. The heart size is normal. The patient has a LEFT-sided transvenous pacemaker with leads to the RIGHT atrium and RIGHT ventricle. A pigtail type pleural catheter is identified on the LEFT, unchanged in position. There  is trace LEFT apical pneumothorax, slightly smaller compared with the previous study. Lungs are clear. No edema. IMPRESSION: Smaller LEFT apical pneumothorax. Electronically Signed   By: Nolon Nations M.D.   On: 08/06/2018 15:17   Dg Chest Port 1 View  Result Date: 08/05/2018 CLINICAL DATA:  Chest tube placement. EXAM: PORTABLE CHEST 1 VIEW COMPARISON:  Chest radiograph August 05, 2018 FINDINGS: Patent LEFT  chest tube with minimal residual LEFT apical pneumothorax. Cardiomediastinal silhouette is normal. Mildly calcified aortic arch. No pleural effusion or focal consolidation. Soft tissue planes and included osseous structures are non suspicious. LEFT dual lead cardiac pacemaker in situ. IMPRESSION: LEFT chest tube with predominately re-expanded LEFT lung, minimal residual LEFT apical pneumothorax. Electronically Signed   By: Elon Alas M.D.   On: 08/05/2018 19:48   Dg Chest Port 1 View  Result Date: 08/05/2018 CLINICAL DATA:  Pneumothorax. EXAM: PORTABLE CHEST 1 VIEW COMPARISON:  [id number] FINDINGS: A left dual chamber pacemaker is in place. A moderate-sized pneumothorax is present on the left. There is no mediastinal shift. Right lung is clear. No focal airspace disease is present. IMPRESSION: 1. Moderate left-sided pneumothorax following placement of new a chamber pacemaker. 2. Lead tips project over the right atrium and right ventricle. Electronically Signed   By: San Morelle M.D.   On: 08/05/2018 16:49   Disposition   Pt is being discharged home today in good condition.  Follow-up Plans & Appointments    Follow-up Information    Croitoru, Mihai, MD Follow up.   Specialty:  Cardiology Why:  Pacemaker follow up on 11/11/18 at 10:00.  Please arrive 15 minutes early for check-in. Contact information: [address] [place] [address] [phone]        [facility] Follow up.   Specialty:  Cardiology Why:  Pacemaker wound check on 08/17/2018 at 10:00 AM at our [facility] office.  Contact information: [address], [place]       Alyson Ingles Candee Furbish, MD Follow up.   Specialty:  Urology Why:  office will call you with date and time of appt.  Contact information: [address] [place] [address] [phone]            Discharge Medications   Allergies as of  08/07/2018      Reactions   Propofol Anaphylaxis      Medication List    TAKE these medications   acetaminophen 325 MG tablet Commonly known as:  TYLENOL Take 650 mg by mouth every 6 (six) hours as needed for mild pain.   amLODipine 2.5 MG tablet Commonly known as:  NORVASC Take 2.5 mg by mouth daily.   aspirin EC 81 MG tablet Take 81 mg by mouth daily.   carbidopa-levodopa 25-100 MG tablet Commonly known as:  SINEMET IR Take 1 tablet by mouth 3 (three) times daily. (0830, 1330, & 2030)   cefdinir 300 MG capsule Commonly known as:  OMNICEF Take 1 capsule (300 mg total) by mouth every 12 (twelve) hours. Start taking on:  08/08/2018   CEREFOLIN 02-28-49-5 MG Tabs Take 1 tablet by mouth daily.   donepezil 10 MG tablet Commonly known as:  ARICEPT Take 10 mg by mouth daily.   escitalopram 10 MG tablet Commonly known as:  LEXAPRO Take 10 mg by mouth at bedtime.   multivitamin with minerals Tabs tablet Take 1 tablet by mouth daily.   quinapril 40 MG tablet Commonly known  as:  ACCUPRIL Take 40 mg by mouth 2 (two) times daily.           Outstanding Labs/Studies   None  Duration of Discharge Encounter   Greater than 30 minutes including physician time.  Signed, Abigail Butts, PA-C 08/07/2018, 5:04 PM   I have examined the patient and reviewed assessment and plan and discussed with patient.  Agree with above as stated.  Please see my note from earlier today.  Stable from pulmonary perspective.  Anitbiotics for urethritis, per urology.    Pacemaker f/u arranged.  OK for discharge.   Larae Grooms

## 2018-08-07 NOTE — Progress Notes (Signed)
    I have called urology regarding bleeding from the meatus and spoke with Dr. Alyson Ingles. He feels that pt may have urethritis (poss e.Coli). Recommends giving Rocephin 1 gm and then Bactrim or Ceftin. I consulted with pharmacy for dosing and they recommend Cefdinir 300 mg BID for 5 days.  Dr. Alyson Ingles will be around later for consult. I have pended discharge summary and discharge orders and pt should be able to go home once consult done, although pt's wife is not comfortable with his leaving as late as 7 or 8:00 tonight.   Daune Perch, AGNP-C [facility] 08/07/2018  3:39 PM

## 2018-08-07 NOTE — Progress Notes (Signed)
Initial Nutrition Assessment  DOCUMENTATION CODES:   Severe malnutrition in context of chronic illness, Underweight  INTERVENTION:   - Continue Ensure Enlive BID - Add Magic Cup BID   NUTRITION DIAGNOSIS:   Severe Malnutrition related to chronic illness, lethargy/confusion as evidenced by estimated needs, per patient/family report, percent weight loss, severe fat depletion, moderate muscle depletion.   GOAL:   Patient will meet greater than or equal to 90% of their needs, Weight gain   MONITOR:   PO intake  REASON FOR ASSESSMENT:   Malnutrition Screening Tool    ASSESSMENT:   77 yo male, admitted for surgery for sick sinus syndrome. PMH significant for rapidly progressive dementia, HTN, diastolic dysfuction and aortic insufficiency, Parkinson's.  Labs reviewed Meds: Ensure Enlive BID  Pt in bed with family present at time of visit.  Family reports pt has been eating very well during hospital admission, but poorly at home. Typically, pt and wife split portions at meals and have eaten this way for many years. Discussed the importance of full size portions for pt, with every meal including protein and grains. Encouraged wife to also put full sized portions on her plate, even if she does not want to eat all of it.  Daughter reports pt mental status declines in the early evening. Suggested "front-loading" the day with larger meals at breakfast and lunch. Encouraged family to seek out protein-rich snacks. Wife endorses feeling overwhelmed by the suggested changes to normal meal pattern. Suggested finding easy alternatives to usual food choices, using Carnation instant breakfast, adding protein powder to baked goods.  Discussed how dementia plays a role in perceived lack of appetite. Encouraged family to continue to offer balanced meals even if pt states he is not hungry, and to encourage him to eat at meals and snacks.  UBW ~150# 6-8 months ago --> 26% wt loss in 6 months.    Pt denies nausea, vomiting, constipation or diarrhea, or difficulty chewing or swallowing.   Pt amenable to continuing Ensure Enlive BID and to trying Magic Cup BID.   NUTRITION - FOCUSED PHYSICAL EXAM:   Most Recent Value  Orbital Region  Mild depletion  Upper Arm Region  Severe depletion  Thoracic and Lumbar Region  Severe depletion  Buccal Region  Severe depletion  Temple Region  Moderate depletion  Clavicle Bone Region  Moderate depletion  Clavicle and Acromion Bone Region  Severe depletion  Scapular Bone Region  Unable to assess  Dorsal Hand  Moderate depletion  Patellar Region  Moderate depletion  Anterior Thigh Region  Moderate depletion  Posterior Calf Region  Moderate depletion  Edema (RD Assessment)  None  Hair  Other (Comment)  Eyes  Reviewed  Mouth  Reviewed  Skin  Reviewed  Nails  Reviewed      Diet Order:  75-100% of last 2 meals completed, per nsg Diet Order            Diet Heart Room service appropriate? Yes; Fluid consistency: Thin  Diet effective now              EDUCATION NEEDS:  Education needs have been addressed  Skin:  Skin Assessment: Skin Integrity Issues: Skin Integrity Issues:: Other (Comment), Incisions Incisions: chest (closed) Other: ecchymosis  Last BM:  PTA  Height:  Ht Readings from Last 1 Encounters:  08/05/18 5\' 9"  (1.753 m)    Weight:  Wt Readings from Last 1 Encounters:  08/07/18 50.1 kg    Ideal Body Weight:  72.7 kg  BMI:  Body mass index is 16.31 kg/m.  Estimated Nutritional Needs:   Kcal:  2984-7308 calories daily (25-30 kcal/kg IBW)  Protein:  87-109 g protein daily (1.2-1.5 g/kg IBW)  Fluid:  >/= 2L daily or per MD discretion  Althea Grimmer, MS, RDN, LDN On-call pager: [phone]

## 2018-08-07 NOTE — Progress Notes (Signed)
Portable CXR done & results relayed to Dr.Black-Maier.

## 2018-08-07 NOTE — Progress Notes (Signed)
Physical Therapy Treatment Patient Details Name: Charles [name] MRN: [id number] DOB: 06/26/1941 Today's Date: 08/07/2018    History of Present Illness Pt is a 77 y/o male s/p L sided pacemaker implant. Pt with L pneumothorax following pacemaker insertion and had chest tube placed. Pt had chest tube removed on 11/7. PMH includes dementia, HTN, dCHF, and parkinson's disease.     PT Comments    Pt progressing towards goals, however, reports increase in fatigue. Also reports he has not been sleeping well. Noted blood on bed pad, but did not see source; notified RN and NT. Practiced gait with cane, however, seemed to cause pt to be more unsteady and pt with notable difficulty sequencing. Practiced without cane and pt with improved steadiness. Required min guard to supervision for mobility. Educated family how to assist with stair navigation and sequencing using cane for stair navigation given they do not have rails.  Continue to feel pt would benefit from outpatient PT once cleared from pacemaker precautions by MD. Will continue to follow acutely to maximize functional mobility independence and safety.    Follow Up Recommendations  No PT follow up;Supervision for mobility/OOB(OP PT once cleared by MD )     Equipment Recommendations  Cane    Recommendations for Other Services       Precautions / Restrictions Precautions Precautions: Fall;ICD/Pacemaker Precaution Comments: Pt able to recall pacemaker precautions. Educated about implication of those precautions during mobility tasks and ADL tasks. Restrictions Weight Bearing Restrictions: Yes LUE Weight Bearing: Non weight bearing    Mobility  Bed Mobility Overal bed mobility: Needs Assistance Bed Mobility: Supine to Sit;Sit to Supine     Supine to sit: Supervision Sit to supine: Supervision   General bed mobility comments: Supervision for safety. Maintained precautions appropriately. Slower to move this session secondary to fatigue.    Transfers Overall transfer level: Needs assistance Equipment used: None Transfers: Sit to/from Stand Sit to Stand: Min guard         General transfer comment: Min guard for safety.   Ambulation/Gait Ambulation/Gait assistance: Min guard;Supervision Gait Distance (Feet): 150 Feet Assistive device: 1 person hand held assist;Straight cane Gait Pattern/deviations: Step-through pattern;Decreased stride length Gait velocity: Decreased    General Gait Details: Practiced gait with cane, however, pt with difficulty sequencing and seemed to have increased unsteadiness with cane. Practiced without cane and pt with improved steadiness from previous session. Educated family about importance of supervision during mobility tasks.    Stairs Stairs: Yes       General stair comments: Verbally reviewed stair navigation with cane with pt and family. Pt does not have rails at home, so feel use of cane would be helpful.    Wheelchair Mobility    Modified Rankin (Stroke Patients Only)       Balance Overall balance assessment: Needs assistance Sitting-balance support: No upper extremity supported;Feet supported Sitting balance-Leahy Scale: Good     Standing balance support: No upper extremity supported;During functional activity Standing balance-Leahy Scale: Fair                              Cognition Arousal/Alertness: Awake/alert Behavior During Therapy: WFL for tasks assessed/performed Overall Cognitive Status: History of cognitive impairments - at baseline                                 General Comments: Dementia at baseline.  Followed precautions very well      Exercises      General Comments General comments (skin integrity, edema, etc.): Noted blood on bed pad. Notified RN and NT. Did not see source of blood       Pertinent Vitals/Pain Pain Assessment: No/denies pain    Home Living                      Prior Function             PT Goals (current goals can now be found in the care plan section) Acute Rehab PT Goals Patient Stated Goal: to be able to sleep  PT Goal Formulation: With patient Time For Goal Achievement: 08/20/18 Potential to Achieve Goals: Good Progress towards PT goals: Progressing toward goals    Frequency    Min 3X/week      PT Plan Current plan remains appropriate    Co-evaluation              AM-PAC PT "6 Clicks" Daily Activity  Outcome Measure  Difficulty turning over in bed (including adjusting bedclothes, sheets and blankets)?: A Little Difficulty moving from lying on back to sitting on the side of the bed? : A Little Difficulty sitting down on and standing up from a chair with arms (e.g., wheelchair, bedside commode, etc,.)?: Unable Help needed moving to and from a bed to chair (including a wheelchair)?: A Little Help needed walking in hospital room?: A Little Help needed climbing 3-5 steps with a railing? : A Little 6 Click Score: 16    End of Session Equipment Utilized During Treatment: Gait belt;Other (comment)(sling) Activity Tolerance: Patient tolerated treatment well Patient left: in bed;with call bell/phone within reach;with family/visitor present;with bed alarm set Nurse Communication: Mobility status;Other (comment)(blood on bed pad ) PT Visit Diagnosis: Unsteadiness on feet (R26.81);Other abnormalities of gait and mobility (R26.89)     Time: 5364-6803 PT Time Calculation (min) (ACUTE ONLY): 25 min  Charges:  $Gait Training: 23-37 mins                     [name], PT, DPT  Acute Rehabilitation Services  Pager: [phone] Office: [phone]    [name] 08/07/2018, 11:53 AM

## 2018-08-07 NOTE — Progress Notes (Signed)
Discharged pt.home in fair condition ;V/S stable ,no resp.distress noted .IV site was dcd.by Jadene Pierini.site is clean & dry.& chest tube insertion site with dsd d/i .Discharge instructions given by Clair Gulling RN & verbalized understanding & additional instructions also given to pt.regarding chest tube site care.Was wheeled down by [place] NT & went home accompanied by wife & daughter.

## 2018-08-07 NOTE — Discharge Instructions (Signed)
Supplemental Discharge Instructions for  Pacemaker/Defibrillator Patients  Activity Do not raise your left/right arm above shoulder level or extend it backward beyond shoulder level for 2 weeks. Wear the arm sling as a reminder or as needed for comfort for 2 weeks. No heavy lifting or vigorous activity with your left/right arm for 6-8 weeks.    NO DRIVING is preferable for 2 weeks; If absolutely necessary, drive only short, familiar routes. DO wear your seatbelt, even if it crosses over the pacemaker site.  WOUND CARE - Keep the wound area clean and dry.  Remove the dressing the day after you return home (usually 48 hours after the procedure). - DO NOT SUBMERGE UNDER WATER UNTIL FULLY HEALED (no tub baths, hot tubs, swimming pools, etc.).  - You  may shower or take a sponge bath after the dressing is removed. DO NOT SOAK the area and do not allow the shower to directly spray on the site. - If you have staples, these will be removed in the office in 7-14 days. - If you have tape/steri-strips on your wound, these will fall off; do not pull them off prematurely.   - No bandage is needed on the site.  DO  NOT apply any creams, oils, or ointments to the wound area. - If you notice any drainage or discharge from the wound, any swelling, excessive redness or bruising at the site, or if you develop a fever > 101? F after you are discharged home, call the office at once.  Special Instructions - You are still able to use cellular telephones.  Avoid carrying your cellular phone near your device. - When traveling through airports, show security personnel your identification card to avoid being screened in the metal detectors.  - Avoid arc welding equipment, MRI testing (magnetic resonance imaging), TENS units (transcutaneous nerve stimulators).  Call the office for questions about other devices. - Avoid electrical appliances that are in poor condition or are not properly grounded. - Microwave ovens are  safe to be near or to operate.   UROLOGY RECOMMENDATIONS:  - Drink plenty of water to keep the bladder flushed and prevent clot formation - You can obtain over-the-counter Azo to help with painful urination.   CHEST TUBE SITE RECOMMENDATIONS: - You can remove the chest tube dressing 48 hours after your chest tube was removed.  - You may shower or wash the incisions daily with mild soap and water. Pat your incision dry. Do not rub them because it slows healing. Do not put lotions, powders, or antibacterial ointments on the incisions. You may cover with a guaze dressing at home to protect the site from rubbing on clothing.

## 2018-08-09 ENCOUNTER — Encounter (HOSPITAL_COMMUNITY): Payer: Self-pay | Admitting: Emergency Medicine

## 2018-08-09 ENCOUNTER — Other Ambulatory Visit: Payer: Self-pay

## 2018-08-09 ENCOUNTER — Emergency Department (HOSPITAL_COMMUNITY): Payer: Medicare Other

## 2018-08-09 ENCOUNTER — Inpatient Hospital Stay (HOSPITAL_COMMUNITY)
Admission: EM | Admit: 2018-08-09 | Discharge: 2018-08-11 | DRG: 243 | Disposition: A | Discharge status: Home / Self Care | Payer: Medicare Other | Attending: Physical Medicine and Rehabilitation | Admitting: Physical Medicine and Rehabilitation

## 2018-08-09 ENCOUNTER — Emergency Department (HOSPITAL_BASED_OUTPATIENT_CLINIC_OR_DEPARTMENT_OTHER): Payer: Medicare Other

## 2018-08-09 ENCOUNTER — Telehealth: Payer: Self-pay | Admitting: Physician Assistant

## 2018-08-09 DIAGNOSIS — L8990 Pressure ulcer of unspecified site, unspecified stage: Secondary | ICD-10-CM

## 2018-08-09 DIAGNOSIS — I472 Ventricular tachycardia: Secondary | ICD-10-CM | POA: Diagnosis not present

## 2018-08-09 DIAGNOSIS — I309 Acute pericarditis, unspecified: Secondary | ICD-10-CM

## 2018-08-09 DIAGNOSIS — F0280 Dementia in other diseases classified elsewhere without behavioral disturbance: Secondary | ICD-10-CM | POA: Diagnosis present

## 2018-08-09 DIAGNOSIS — J939 Pneumothorax, unspecified: Secondary | ICD-10-CM | POA: Diagnosis not present

## 2018-08-09 DIAGNOSIS — Z950 Presence of cardiac pacemaker: Secondary | ICD-10-CM | POA: Diagnosis present

## 2018-08-09 DIAGNOSIS — Z7982 Long term (current) use of aspirin: Secondary | ICD-10-CM

## 2018-08-09 DIAGNOSIS — I351 Nonrheumatic aortic (valve) insufficiency: Secondary | ICD-10-CM | POA: Diagnosis present

## 2018-08-09 DIAGNOSIS — I361 Nonrheumatic tricuspid (valve) insufficiency: Secondary | ICD-10-CM

## 2018-08-09 DIAGNOSIS — I10 Essential (primary) hypertension: Secondary | ICD-10-CM | POA: Diagnosis present

## 2018-08-09 DIAGNOSIS — I308 Other forms of acute pericarditis: Secondary | ICD-10-CM

## 2018-08-09 DIAGNOSIS — D649 Anemia, unspecified: Secondary | ICD-10-CM | POA: Diagnosis not present

## 2018-08-09 DIAGNOSIS — Z888 Allergy status to other drugs, medicaments and biological substances status: Secondary | ICD-10-CM

## 2018-08-09 DIAGNOSIS — Y838 Other surgical procedures as the cause of abnormal reaction of the patient, or of later complication, without mention of misadventure at the time of the procedure: Secondary | ICD-10-CM | POA: Diagnosis not present

## 2018-08-09 DIAGNOSIS — I495 Sick sinus syndrome: Principal | ICD-10-CM | POA: Diagnosis present

## 2018-08-09 DIAGNOSIS — F0390 Unspecified dementia without behavioral disturbance: Secondary | ICD-10-CM | POA: Diagnosis present

## 2018-08-09 DIAGNOSIS — N342 Other urethritis: Secondary | ICD-10-CM | POA: Diagnosis not present

## 2018-08-09 DIAGNOSIS — Z79899 Other long term (current) drug therapy: Secondary | ICD-10-CM

## 2018-08-09 DIAGNOSIS — J95811 Postprocedural pneumothorax: Secondary | ICD-10-CM | POA: Diagnosis present

## 2018-08-09 DIAGNOSIS — R079 Chest pain, unspecified: Secondary | ICD-10-CM | POA: Diagnosis not present

## 2018-08-09 DIAGNOSIS — T82897A Other specified complication of cardiac prosthetic devices, implants and grafts, initial encounter: Secondary | ICD-10-CM | POA: Diagnosis not present

## 2018-08-09 DIAGNOSIS — I959 Hypotension, unspecified: Secondary | ICD-10-CM | POA: Diagnosis not present

## 2018-08-09 DIAGNOSIS — Z959 Presence of cardiac and vascular implant and graft, unspecified: Secondary | ICD-10-CM

## 2018-08-09 DIAGNOSIS — G3183 Dementia with Lewy bodies: Secondary | ICD-10-CM | POA: Diagnosis present

## 2018-08-09 HISTORY — DX: Acute pericarditis, unspecified: I30.9

## 2018-08-09 LAB — COMPREHENSIVE METABOLIC PANEL
ALT: 21 U/L (ref 0–44)
ANION GAP: 10 (ref 5–15)
AST: 46 U/L — AB (ref 15–41)
Albumin: 3.1 g/dL — ABNORMAL LOW (ref 3.5–5.0)
Alkaline Phosphatase: 54 U/L (ref 38–126)
BUN: 21 mg/dL (ref 8–23)
CHLORIDE: 99 mmol/L (ref 98–111)
CO2: 27 mmol/L (ref 22–32)
Calcium: 9 mg/dL (ref 8.9–10.3)
Creatinine, Ser: 1.07 mg/dL (ref 0.61–1.24)
Glucose, Bld: 192 mg/dL — ABNORMAL HIGH (ref 70–99)
POTASSIUM: 4 mmol/L (ref 3.5–5.1)
Sodium: 136 mmol/L (ref 135–145)
Total Bilirubin: 1.1 mg/dL (ref 0.3–1.2)
Total Protein: 5.7 g/dL — ABNORMAL LOW (ref 6.5–8.1)

## 2018-08-09 LAB — CBC WITH DIFFERENTIAL/PLATELET
ABS IMMATURE GRANULOCYTES: 0.02 10*3/uL (ref 0.00–0.07)
Basophils Absolute: 0.1 10*3/uL (ref 0.0–0.1)
Basophils Relative: 1 %
EOS PCT: 1 %
Eosinophils Absolute: 0.1 10*3/uL (ref 0.0–0.5)
HEMATOCRIT: 38 % — AB (ref 39.0–52.0)
HEMOGLOBIN: 11.9 g/dL — AB (ref 13.0–17.0)
Immature Granulocytes: 0 %
LYMPHS PCT: 13 %
Lymphs Abs: 1.2 10*3/uL (ref 0.7–4.0)
MCH: 31.1 pg (ref 26.0–34.0)
MCHC: 31.3 g/dL (ref 30.0–36.0)
MCV: 99.2 fL (ref 80.0–100.0)
MONO ABS: 1.2 10*3/uL — AB (ref 0.1–1.0)
MONOS PCT: 13 %
NEUTROS ABS: 6.9 10*3/uL (ref 1.7–7.7)
Neutrophils Relative %: 72 %
Platelets: 178 10*3/uL (ref 150–400)
RBC: 3.83 MIL/uL — ABNORMAL LOW (ref 4.22–5.81)
RDW: 13.9 % (ref 11.5–15.5)
WBC: 9.5 10*3/uL (ref 4.0–10.5)
nRBC: 0 % (ref 0.0–0.2)

## 2018-08-09 LAB — TSH: TSH: 3.334 u[IU]/mL (ref 0.350–4.500)

## 2018-08-09 LAB — I-STAT CHEM 8, ED
BUN: 27 mg/dL — ABNORMAL HIGH (ref 8–23)
CALCIUM ION: 1.25 mmol/L (ref 1.15–1.40)
CREATININE: 1.1 mg/dL (ref 0.61–1.24)
Chloride: 97 mmol/L — ABNORMAL LOW (ref 98–111)
Glucose, Bld: 116 mg/dL — ABNORMAL HIGH (ref 70–99)
HCT: 37 % — ABNORMAL LOW (ref 39.0–52.0)
HEMOGLOBIN: 12.6 g/dL — AB (ref 13.0–17.0)
Potassium: 4.1 mmol/L (ref 3.5–5.1)
SODIUM: 136 mmol/L (ref 135–145)
TCO2: 33 mmol/L — AB (ref 22–32)

## 2018-08-09 LAB — TROPONIN I: Troponin I: <0.03 ng/mL (ref <0.03)

## 2018-08-09 LAB — ECHOCARDIOGRAM LIMITED
Height: 69 in
Weight: 1763.68 oz

## 2018-08-09 LAB — I-STAT TROPONIN, ED: Troponin i, poc: 0 ng/mL (ref 0.00–0.08)

## 2018-08-09 MED ORDER — ESCITALOPRAM OXALATE 10 MG PO TABS
10.0000 mg | ORAL_TABLET | Freq: Every day | ORAL | Status: DC
  Administered 2018-08-09 – 2018-08-10 (×2): 10 mg via ORAL
  Filled 2018-08-09 (×2): qty 1

## 2018-08-09 MED ORDER — SODIUM CHLORIDE 0.9 % IV SOLN: 250.0000 mL | INTRAVENOUS | Status: DC | PRN

## 2018-08-09 MED ORDER — SODIUM CHLORIDE 0.9% FLUSH
3.0000 mL | Freq: Two times a day (BID) | INTRAVENOUS | Status: DC
  Administered 2018-08-09 – 2018-08-11 (×4): 3 mL via INTRAVENOUS

## 2018-08-09 MED ORDER — AMLODIPINE BESYLATE 2.5 MG PO TABS: 2.5000 mg | ORAL_TABLET | Freq: Every day | ORAL | Status: DC

## 2018-08-09 MED ORDER — ADULT MULTIVITAMIN W/MINERALS CH
1.0000 | ORAL_TABLET | Freq: Every day | ORAL | Status: DC
  Administered 2018-08-09 – 2018-08-11 (×3): 1 via ORAL
  Filled 2018-08-09 (×3): qty 1

## 2018-08-09 MED ORDER — CARBIDOPA-LEVODOPA 25-100 MG PO TABS
1.0000 | ORAL_TABLET | Freq: Three times a day (TID) | ORAL | Status: DC
  Administered 2018-08-09 – 2018-08-11 (×5): 1 via ORAL
  Filled 2018-08-09 (×5): qty 1

## 2018-08-09 MED ORDER — CEREFOLIN 6-1-50-5 MG PO TABS: 1.0000 | ORAL_TABLET | Freq: Every day | ORAL | Status: DC

## 2018-08-09 MED ORDER — DONEPEZIL HCL 10 MG PO TABS
10.0000 mg | ORAL_TABLET | Freq: Every day | ORAL | Status: DC
  Administered 2018-08-10 – 2018-08-11 (×2): 10 mg via ORAL
  Filled 2018-08-09 (×3): qty 1

## 2018-08-09 MED ORDER — QUINAPRIL HCL 10 MG PO TABS
40.0000 mg | ORAL_TABLET | Freq: Two times a day (BID) | ORAL | Status: DC
  Filled 2018-08-09: qty 4

## 2018-08-09 MED ORDER — CEFDINIR 300 MG PO CAPS
300.0000 mg | ORAL_CAPSULE | Freq: Two times a day (BID) | ORAL | Status: DC
  Administered 2018-08-09 – 2018-08-11 (×4): 300 mg via ORAL
  Filled 2018-08-09 (×4): qty 1

## 2018-08-09 MED ORDER — SODIUM CHLORIDE 0.9% FLUSH: 3.0000 mL | INTRAVENOUS | Status: DC | PRN

## 2018-08-09 MED ORDER — COLCHICINE 0.6 MG PO TABS
0.6000 mg | ORAL_TABLET | Freq: Two times a day (BID) | ORAL | Status: DC
  Administered 2018-08-09 – 2018-08-11 (×4): 0.6 mg via ORAL
  Filled 2018-08-09 (×4): qty 1

## 2018-08-09 MED ORDER — ACETAMINOPHEN 325 MG PO TABS
650.0000 mg | ORAL_TABLET | Freq: Four times a day (QID) | ORAL | Status: DC | PRN
  Administered 2018-08-09: 650 mg via ORAL
  Filled 2018-08-09: qty 2

## 2018-08-09 MED ORDER — ONDANSETRON HCL 4 MG/2ML IJ SOLN: 4.0000 mg | Freq: Four times a day (QID) | INTRAMUSCULAR | Status: DC | PRN

## 2018-08-09 MED ORDER — ASPIRIN 325 MG PO TABS: 650.0000 mg | ORAL_TABLET | Freq: Three times a day (TID) | ORAL | Status: DC

## 2018-08-09 MED ORDER — SODIUM CHLORIDE 0.9 % IV BOLUS
500.0000 mL | Freq: Once | INTRAVENOUS | Status: AC
  Administered 2018-08-09: 500 mL via INTRAVENOUS

## 2018-08-09 NOTE — ED Notes (Signed)
ED Provider at bedside. 

## 2018-08-09 NOTE — Progress Notes (Signed)
New pt admission from ED. Pt brought to the floor in stable condition. Vitals taken. Initial Assessment done. All immediate pertinent needs to patient addressed. Patient Guide given to patient. Important safety instructions relating to hospitalization reviewed with patient. Patient verbalized understanding. Family members in bed side and is Garment/textile technologist, Therapist, sports

## 2018-08-09 NOTE — Telephone Encounter (Signed)
Charles [name] is a 77 y.o. male who is s/p pacer implant last week complicated by L apical pneumothorax.  He required CT placement and was followed by CCM.  He was DC on Fri and was doing well.  Yesterday (Sat), he developed pleuritic chest pain. His daughter called the answering service and I spoke to the patient and his daughter on speaker phone.  He seems to get out of breath with talking.   PLAN:  1. Given his recent hx of pacer implant complicated by a pneumothorax, I recommend he go to the ED for evaluation.  At the very least, he needs a CXR to see if his pneumothorax has enlarged. The family agrees to take him to the ED. Charles Dopp, PA-C    08/09/2018 12:01 PM

## 2018-08-09 NOTE — H&P (Addendum)
Cardiology Admission History and Physical:   Patient ID: DRAKKAR MEDEIROS MRN: [id number]; DOB: 1941-06-09   Admission date: 08/09/2018  Primary Care Provider: Jani Gravel, MD Primary Cardiologist/Electrophysiologist: Sanda Klein, MD    Chief Complaint:  Chest Pain  Patient Profile:   Charles [name] is a 77 y.o. male with Lewy Body dementia, Parkinson's disease, hypertension, aortic insufficiency, symptomatic bradycardia s/p pacemaker implant 93/2/35 (complicated by pneumothorax) presented to the ED today with pleuritic chest pain.    History of Present Illness:   Charles [name] was evaluated by Dr. Sanda Klein recently for symptomatic bradycardia and was set up for pacemaker implant on 08/05/18.  He had a dual chamber device implanted (St Jude).  His procedure was complicated by a moderate L pneumothorax requiring chest tube placement x 1 day.  At discharge, the residual pneumothorax was smaller.  He did have some bleeding from his penis and he was started on Cefdinir for urethritis and follow up with urology in 1-2 weeks.  He was DC to home 2 days ago.   His family called the answering service today with complaints of chest pain worse with deep breathing radiating to his back.  He was asked to come to the ED.  His CXR shows a smaller pneumothorax.  His ECG shows diffuse ST elevation consistent with pericarditis, clear change from prior.  His wife helps with the hx as he does have dementia.  Charles [name] does note pain with lying flat that feels better with sitting up.  He has not had any fevers, cough, vomiting, diarrhea, melena, hematochezia.     Past Medical History:  Diagnosis Date  . Anxiety   . Aortic regurgitation   . Complication of anesthesia   . Depression   . Diastolic dysfunction   . Hypertension   . 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 SSS (sick sinus syndrome) ([place])     Past Surgical History:  Procedure Laterality  Date  . acromioclavicular separation  07/04/2016  . CHEST TUBE INSERTION Left 08/05/2018  . EYE SURGERY    . HERNIA REPAIR    . NM MYOVIEW LTD    . PACEMAKER IMPLANT N/A 08/05/2018   Procedure: St Jude dual lead PACEMAKER IMPLANT;  Surgeon: Sanda Klein, MD;  Location: [facility];  Service: Cardiovascular;  Laterality: N/A;  . US ECHOCARDIOGRAPHY       Medications Prior to Admission: Prior to Admission medications   Medication Sig Start Date End Date Taking? Authorizing Provider  acetaminophen (TYLENOL) 325 MG tablet Take 650 mg by mouth every 6 (six) hours as needed for mild pain.   Yes [provider]  amLODipine (NORVASC) 2.5 MG tablet Take 2.5 mg by mouth daily. 05/01/16  Yes [provider]  aspirin EC 81 MG tablet Take 81 mg by mouth daily.   Yes [provider]  carbidopa-levodopa (SINEMET IR) 25-100 MG tablet Take 1 tablet by mouth 3 (three) times daily. (0830, 1330, & 2030) 06/12/18  Yes [provider]  cefdinir (OMNICEF) 300 MG capsule Take 1 capsule (300 mg total) by mouth every 12 (twelve) hours. 08/08/18  Yes Kroeger, Daleen Snook M., PA-C  donepezil (ARICEPT) 10 MG tablet Take 10 mg by mouth daily.  04/27/18  Yes [provider]  escitalopram (LEXAPRO) 10 MG tablet Take 10 mg by mouth at bedtime.    Yes [provider]  L-Methylfolate-B12-B6-B2 (CEREFOLIN) 02-28-49-5 MG TABS Take 1 tablet by mouth  daily.    Yes [provider]  Multiple Vitamin (MULTIVITAMIN WITH MINERALS) TABS tablet Take 1 tablet by mouth daily.   Yes [provider]  quinapril (ACCUPRIL) 40 MG tablet Take 40 mg by mouth 2 (two) times daily.   Yes [provider]     Allergies:    Allergies  Allergen Reactions  . Propofol Anaphylaxis    Social History:   Social History   Socioeconomic History  . Marital status: Married    Spouse name: Not on file  . Number of children: 3  . Years of education: Not on file  . Highest  education level: Associate degree: academic program  Occupational History  . Not on file  Social Needs  . Financial resource strain: Not on file  . Food insecurity:    Worry: Not on file    Inability: Not on file  . Transportation needs:    Medical: Not on file    Non-medical: Not on file  Tobacco Use  . Smoking status: Never Smoker  . Smokeless tobacco: Never Used  Substance and Sexual Activity  . Alcohol use: No  . Drug use: No  . Sexual activity: Not on file  Lifestyle  . Physical activity:    Days per week: Not on file    Minutes per session: Not on file  . Stress: Not on file  Relationships  . Social connections:    Talks on phone: Not on file    Gets together: Not on file    Attends religious service: Not on file    Active member of club or organization: Not on file    Attends meetings of clubs or organizations: Not on file    Relationship status: Not on file  . Intimate partner violence:    Fear of current or ex partner: Not on file    Emotionally abused: Not on file    Physically abused: Not on file    Forced sexual activity: Not on file  Other Topics Concern  . Not on file  Social History Narrative   Lives at home with his wife   Right handed   No caffeine     Family History:    The patient's family history includes Angina in his father; Asthma in his maternal grandmother; Diabetes in his brother, mother, and paternal grandfather; Heart Problems in his brother; Heart attack in his brother and father; Heart disease in his brother; Hypertension in his mother; Lung cancer in his mother; Stroke in his maternal grandfather; Thyroid disease in his mother. There is no history of Dementia.    ROS:  Please see the history of present illness.  All other ROS reviewed and negative.     Physical Exam/Data:   Vitals:   08/09/18 1253 08/09/18 1315 08/09/18 1330 08/09/18 1345  BP: 90/63 (!) 100/59 114/66 122/73  Pulse: 71 68 68 67  Resp: 18 15 16 14   Temp: (!) 97.5  F (36.4 C)     TempSrc: Oral     SpO2: 100% 100% 100% 100%  Weight:      Height:       No intake or output data in the 24 hours ending 08/09/18 1525 Filed Weights   08/09/18 1250  Weight: 50 kg   Body mass index is 16.28 kg/m.  General:  Thin frail elderly male in no acute distress  HEENT: normal Lymph: no adenopathy Neck: no JVD Endocrine:  No thryomegaly Cardiac:  normal S1, S2; RRR; no  murmur ? Rub along LSB Lungs:  clear to auscultation bilaterally, no wheezing, rhonchi or rales  Abd: soft, nontender  Ext: no edema Musculoskeletal:  No deformities  Skin: warm and dry  Neuro:  CNs 2-12 intact, no focal abnormalities noted Psych:  Normal affect    EKG:  The ECG that was done   was personally reviewed and demonstrates NSR, HR 72, diffuse 2-3 mm ST elevation, QTc 413  Relevant CV Studies: Echo 4.3.13 EF > 55, bicuspid aortic valve, trace AI mild TR  Nuclear stress test 4.3.13 Normal study  Laboratory Data:  Chemistry Recent Labs  Lab 08/03/18 1135 08/09/18 1311  NA 141 136  K 5.1 4.1  CL 97 97*  CO2 27  --   GLUCOSE 85 116*  BUN 18 27*  CREATININE 1.08 1.10  CALCIUM 9.8  --   GFRNONAA 66  --   GFRAA 77  --      Hematology Recent Labs  Lab 08/03/18 1135 08/09/18 1258 08/09/18 1311  WBC 7.2 9.5  --   RBC 4.09* 3.83*  --   HGB 13.1 11.9* 12.6*  HCT 38.3 38.0* 37.0*  MCV 94 99.2  --   MCH 32.0 31.1  --   MCHC 34.2 31.3  --   RDW 12.5 13.9  --   PLT 204 178  --    Cardiac EnzymesNo results for input(s): TROPONINI in the last 168 hours.  Recent Labs  Lab 08/09/18 1309  TROPIPOC 0.00    Radiology/Studies:  Dg Chest Port 1 View  Result Date: 08/09/2018 CLINICAL DATA:  Pt complains of chest pain when he breathes and SOB since yesterday. Pt reports that he had a pacemaker placed on Wednesday and a chest tube removed on Friday. non-smoker. Hx of HTN EXAM: PORTABLE CHEST 1 VIEW COMPARISON:  08/07/2018 FINDINGS: Tiny left apical pneumothorax,  decreased in size from the prior exam. Lungs are hyperexpanded mildly prominent bronchovascular markings, but otherwise clear. Cardiac silhouette is normal in size. No mediastinal hilar masses. Stable left anterior chest wall sequential pacemaker. IMPRESSION: 1. Further decrease in the minimal left apical pneumothorax when compared to the prior study. 2. No other change.  Lungs remain clear. Electronically Signed   By: Lajean Manes M.D.   On: 08/09/2018 13:38    Assessment and Plan:   1. Acute pericarditis - This is probably related to the recent pacer implant.  His ECG and symptoms are consistent with pericarditis.  He has a small effusion on the echocardiogram which was just completed.  There is no tamponade.  -Admit for observation   -Start ASA 650 mg Q 8 hours  -Colchicine 0.6 mg Twice daily >> transition to QD at DC (treate for 3 mos)  -No Heparin due to effusion >> treat with SCDs  2. S/p Pacemaker - Follow up as planned.  3. Dementia - Continue current medications.  4. Urethritis - Continue cefdinir.  Severity of Illness: The appropriate patient status for this patient is OBSERVATION. Observation status is judged to be reasonable and necessary in order to provide the required intensity of service to ensure the patient's safety. The patient's presenting symptoms, physical exam findings, and initial radiographic and laboratory data in the context of their medical condition is felt to place them at decreased risk for further clinical deterioration. Furthermore, it is anticipated that the patient will be medically stable for discharge from the hospital within 2 midnights of admission. The following factors support the patient status of observation.   "  The patient's presenting symptoms include chest pain. " The physical exam findings include pericardial rub. " The initial radiographic and laboratory data are diffuse ST elevation on ECG, pericardial effusion on echo.    For questions or  updates, please contact [facility] Please consult [email or web address] for contact info under        Signed, Richardson Dopp, PA-C  08/09/2018 3:25 PM    Patient seen with PA, agree with the above note.   Patient with Lewy body dementia was brought by family to ER with pleuritic chest pain.  He had PPM placed on 11/6 for sick sinus syndrome, this was complicated by apical PTX.  He was recently discharged home.   On CXR today, PTX is tiny.  ECG shows diffuse ST segment elevation that is clearly different from prior.  Troponin is not elevated.   Echo was done and viewed at bedside.  There is a small circumferential pericardial effusion without evidence for tamponade though IVC is dilated.  LV and RV function appear normal.   On exam, frail elderly gentleman with some memory deficit.  Regular S1S2, sounds like a soft friction rub.  No JVD.  No edema.  Clear lungs.   1. Acute pericarditis: I suspect that the patient's pleuritic chest pain and diffuse ST elevation along with possible friction rub are due to acute pericarditis.  I suspect this is related to the recent PPM placement with pericardial irritation, ?microperforation.  There is a small pericardial effusion present without evidence for tamponade.  - Discussed situation with Dr. Rayann Heman, may need a lead revision.  Therefore, will keep NPO after midnight and will not give high dose aspirin.    - Start colchicine for pericardial pain.   - No heparin/Lovenox, use SCDs for DVT prophylaxis.  - Cycle troponin.  2. PTX: Small PTX post PPM placement on 11/6, smaller now than on prior CXR.  I do not think that this is causing his symptoms.  3. Sick sinus syndrome: S/p PPM placement on 11/6 as above.  4. Lewy body dementia: On Aricept.  5. UTI: On cefdinir, continue.   Loralie Champagne 08/09/2018 4:21 PM

## 2018-08-09 NOTE — ED Triage Notes (Signed)
Pt arrives to ED from home with complaints of chest pain when he breathes since yesterday. Pt reports that he had a pacemaker placed on Wednesday and a chest tube removed on Friday. Pt placed in position of comfort with bed locked and lowered, call bell in reach.

## 2018-08-09 NOTE — ED Provider Notes (Signed)
[facility] EMERGENCY DEPARTMENT Provider Note   CSN: [id number] Arrival date & time: 08/09/18  1240  Level 5 caveat dementia    History   Chief Complaint Chief Complaint  Patient presents with  . Chest Pain  . Shortness of Breath    HPI Charles [name] is a 77 y.o. male.  Patient complains of pleuritic left-sided chest pain onset for the past few days by shortness of breath.  Pain is worse with deep inspiration.  Treatment prior to coming here.  HPI  Past Medical History:  Diagnosis Date  . Anxiety   . Aortic regurgitation   . Complication of anesthesia   . Depression   . Diastolic dysfunction   . Hypertension   . 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 SSS (sick sinus syndrome) [facility])     Patient Active Problem List   Diagnosis Date Noted  . Urethritis 08/07/2018  . Encounter for chest tube removal   . Pacemaker 08/05/2018  . Pneumothorax on left 08/05/2018  . SSS (sick sinus syndrome) ([place]) 07/14/2018  . 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 Subacute confusional state 10/14/2017    Past Surgical History:  Procedure Laterality Date  . acromioclavicular separation  07/04/2016  . CHEST TUBE INSERTION Left 08/05/2018  . EYE SURGERY    . HERNIA REPAIR    . NM MYOVIEW LTD    . PACEMAKER IMPLANT N/A 08/05/2018   Procedure: St Jude dual lead PACEMAKER IMPLANT;  Surgeon: Sanda Klein, MD;  Location: [place] CV LAB;  Service: Cardiovascular;  Laterality: N/A;  . US ECHOCARDIOGRAPHY          Home Medications    Prior to Admission medications   Medication Sig Start Date End Date Taking? Authorizing Provider  acetaminophen (TYLENOL) 325 MG tablet Take 650 mg by mouth every 6 (six) hours as needed for mild pain.    [provider]  amLODipine (NORVASC) 2.5 MG tablet Take 2.5 mg by mouth daily. 05/01/16   [provider]  aspirin EC 81 MG tablet Take 81 mg by  mouth daily.    [provider]  carbidopa-levodopa (SINEMET IR) 25-100 MG tablet Take 1 tablet by mouth 3 (three) times daily. (0830, 1330, & 2030) 06/12/18   [provider]  cefdinir (OMNICEF) 300 MG capsule Take 1 capsule (300 mg total) by mouth every 12 (twelve) hours. 08/08/18   Kroeger, Lorelee Cover., PA-C  donepezil (ARICEPT) 10 MG tablet Take 10 mg by mouth daily.  04/27/18   [provider]  escitalopram (LEXAPRO) 10 MG tablet Take 10 mg by mouth at bedtime.     [provider]  L-Methylfolate-B12-B6-B2 (CEREFOLIN) 02-28-49-5 MG TABS Take 1 tablet by mouth daily.     [provider]  Multiple Vitamin (MULTIVITAMIN WITH MINERALS) TABS tablet Take 1 tablet by mouth daily.    [provider]  quinapril (ACCUPRIL) 40 MG tablet Take 40 mg by mouth 2 (two) times daily.    [provider]    Family History Family History  Problem Relation Age of Onset  . Hypertension Mother   . Lung cancer Mother   . Diabetes Mother   . Thyroid disease Mother   . Angina Father   . Heart attack Father   . Heart disease Brother   . Diabetes Brother   . Heart attack Brother   . Asthma Maternal Grandmother   . Stroke Maternal Grandfather   .  Diabetes Paternal Grandfather   . Heart Problems Brother   . Dementia Neg Hx     Social History Social History   Tobacco Use  . Smoking status: Never Smoker  . Smokeless tobacco: Never Used  Substance Use Topics  . Alcohol use: No  . Drug use: No     Allergies   Propofol   Review of Systems Review of Systems  Unable to perform ROS: Dementia  Respiratory: Positive for shortness of breath.   Cardiovascular: Positive for chest pain.     Physical Exam Updated Vital Signs BP 90/63 (BP Location: Right Arm)   Pulse 71   Temp (!) 97.5 F (36.4 C) (Oral)   Resp 18   Ht 5\' 9"  (1.753 m)   Wt 50 kg   SpO2 100%   BMI 16.28 kg/m   Physical Exam  Constitutional:  Frail, chronically  ill-appearing  HENT:  Head: Normocephalic and atraumatic.  Eyes: Pupils are equal, round, and reactive to light. Conjunctivae are normal.  Neck: Neck supple. No tracheal deviation present. No thyromegaly present.  Cardiovascular: Normal rate and regular rhythm.  No murmur heard. Pulmonary/Chest: Effort normal.  Diminished breath sounds at left apex  Abdominal: Soft. Bowel sounds are normal. He exhibits no distension. There is no tenderness.  Musculoskeletal: Normal range of motion. He exhibits no edema or tenderness.  Neurological: He is alert. Coordination normal.  Skin: Skin is warm and dry. No rash noted.  Nursing note and vitals reviewed.    ED Treatments / Results  Labs (all labs ordered are listed, but only abnormal results are displayed) Labs Reviewed  CBC WITH DIFFERENTIAL/PLATELET  I-STAT CHEM 8, ED  I-STAT TROPONIN, ED   ED ECG REPORT   Date: 08/09/2018  Rate: 70  Rhythm: normal sinus rhythm  QRS Axis: normal  Intervals: normal  ST/T Wave abnormalities: ST elevations diffusely  Conduction Disutrbances:none  Narrative Interpretation:   Old EKG Reviewed: changes noted Diffuse ST segment elevation consistent with possible pericarditis, new from previous tracing from 08/06/2018, but doubt acute MI I have personally reviewed the EKG tracing and disagree with the computerized printout as noted. EKG None  Radiology No results found.  Procedures Procedures (including critical care time)  Medications Ordered in ED Medications - No data to display  Chest x-ray viewed by me Results for orders placed or performed during the hospital encounter of 08/09/18  CBC with Differential/Platelet  Result Value Ref Range   WBC 9.5 4.0 - 10.5 K/uL   RBC 3.83 (L) 4.22 - 5.81 MIL/uL   Hemoglobin 11.9 (L) 13.0 - 17.0 g/dL   HCT 38.0 (L) 39.0 - 52.0 %   MCV 99.2 80.0 - 100.0 fL   MCH 31.1 26.0 - 34.0 pg   MCHC 31.3 30.0 - 36.0 g/dL   RDW 13.9 11.5 - 15.5 %   Platelets 178  150 - 400 K/uL   nRBC 0.0 0.0 - 0.2 %   Neutrophils Relative % 72 %   Neutro Abs 6.9 1.7 - 7.7 K/uL   Lymphocytes Relative 13 %   Lymphs Abs 1.2 0.7 - 4.0 K/uL   Monocytes Relative 13 %   Monocytes Absolute 1.2 (H) 0.1 - 1.0 K/uL   Eosinophils Relative 1 %   Eosinophils Absolute 0.1 0.0 - 0.5 K/uL   Basophils Relative 1 %   Basophils Absolute 0.1 0.0 - 0.1 K/uL   Immature Granulocytes 0 %   Abs Immature Granulocytes 0.02 0.00 - 0.07 K/uL  I-stat chem 8,  ed  Result Value Ref Range   Sodium 136 135 - 145 mmol/L   Potassium 4.1 3.5 - 5.1 mmol/L   Chloride 97 (L) 98 - 111 mmol/L   BUN 27 (H) 8 - 23 mg/dL   Creatinine, Ser 1.10 0.61 - 1.24 mg/dL   Glucose, Bld 116 (H) 70 - 99 mg/dL   Calcium, Ion 1.25 1.15 - 1.40 mmol/L   TCO2 33 (H) 22 - 32 mmol/L   Hemoglobin 12.6 (L) 13.0 - 17.0 g/dL   HCT 37.0 (L) 39.0 - 52.0 %  I-stat troponin, ED  Result Value Ref Range   Troponin i, poc 0.00 0.00 - 0.08 ng/mL   Comment 3           Dg Chest 2 View  Result Date: 08/06/2018 CLINICAL DATA:  Pneumothorax and pacemaker.  Soreness. EXAM: CHEST - 2 VIEW COMPARISON:  08/05/2018. FINDINGS: Unchanged cardiomediastinal silhouette and clear lung fields. Stable dual lead pacemaker. Small LEFT apical pneumothorax is redemonstrated. Pigtail catheter in the LEFT hemithorax is unchanged. IMPRESSION: Stable small LEFT apical pneumothorax. Electronically Signed   By: Staci Righter M.D.   On: 08/06/2018 09:25   Dg Chest Port 1 View  Result Date: 08/09/2018 CLINICAL DATA:  Pt complains of chest pain when he breathes and SOB since yesterday. Pt reports that he had a pacemaker placed on Wednesday and a chest tube removed on Friday. non-smoker. Hx of HTN EXAM: PORTABLE CHEST 1 VIEW COMPARISON:  08/07/2018 FINDINGS: Tiny left apical pneumothorax, decreased in size from the prior exam. Lungs are hyperexpanded mildly prominent bronchovascular markings, but otherwise clear. Cardiac silhouette is normal in size. No  mediastinal hilar masses. Stable left anterior chest wall sequential pacemaker. IMPRESSION: 1. Further decrease in the minimal left apical pneumothorax when compared to the prior study. 2. No other change.  Lungs remain clear. Electronically Signed   By: Lajean Manes M.D.   On: 08/09/2018 13:38   Dg Chest Port 1 View  Result Date: 08/07/2018 CLINICAL DATA:  Status post pacemaker.  Followup pneumothorax. EXAM: PORTABLE CHEST 1 VIEW COMPARISON:  08/06/2018 FINDINGS: Minimal left apical pneumothorax, stable from the previous day's exam. Lungs are hyperexpanded but clear. Stable left anterior chest wall sequential pacemaker IMPRESSION: 1. Minimal left apical pneumothorax unchanged from the previous day's exam. Electronically Signed   By: Lajean Manes M.D.   On: 08/07/2018 11:01   Dg Chest Port 1 View  Result Date: 08/06/2018 CLINICAL DATA:  Chest tube removal EXAM: PORTABLE CHEST 1 VIEW COMPARISON:  08/06/2018, 08/05/2018, CT 10/17/2017 FINDINGS: Removal of left-sided chest tube. Minimal increase in size of a trace left apical pneumothorax. Left-sided pacing device as before. No acute airspace disease. Normal heart size. IMPRESSION: Removal of left-sided chest tube. Minimal increase in size of a tiny left apical pneumothorax. Electronically Signed   By: Donavan Foil M.D.   On: 08/06/2018 20:05   Dg Chest Port 1 View  Result Date: 08/06/2018 CLINICAL DATA:  Pneumothorax, chest tube removed. Hx of aortic regurgitation, hypertension, diastolic dysfunction, [id number](54/6/56). EXAM: PORTABLE CHEST 1 VIEW COMPARISON:  08/06/2017 FINDINGS: Lungs are mildly hyperinflated. The heart size is normal. The patient has a LEFT-sided transvenous pacemaker with leads to the RIGHT atrium and RIGHT ventricle. A pigtail type pleural catheter is identified on the LEFT, unchanged in position. There is trace LEFT apical pneumothorax, slightly smaller compared with the previous study. Lungs are clear. No edema. IMPRESSION:  Smaller LEFT apical pneumothorax. Electronically Signed   By: Nolon Nations M.D.  On: 08/06/2018 15:17   Dg Chest Port 1 View  Result Date: 08/05/2018 CLINICAL DATA:  Chest tube placement. EXAM: PORTABLE CHEST 1 VIEW COMPARISON:  Chest radiograph August 05, 2018 FINDINGS: Patent LEFT chest tube with minimal residual LEFT apical pneumothorax. Cardiomediastinal silhouette is normal. Mildly calcified aortic arch. No pleural effusion or focal consolidation. Soft tissue planes and included osseous structures are non suspicious. LEFT dual lead cardiac pacemaker in situ. IMPRESSION: LEFT chest tube with predominately re-expanded LEFT lung, minimal residual LEFT apical pneumothorax. Electronically Signed   By: Elon Alas M.D.   On: 08/05/2018 19:48   Dg Chest Port 1 View  Result Date: 08/05/2018 CLINICAL DATA:  Pneumothorax. EXAM: PORTABLE CHEST 1 VIEW COMPARISON:  [phone] FINDINGS: A left dual chamber pacemaker is in place. A moderate-sized pneumothorax is present on the left. There is no mediastinal shift. Right lung is clear. No focal airspace disease is present. IMPRESSION: 1. Moderate left-sided pneumothorax following placement of new a chamber pacemaker. 2. Lead tips project over the right atrium and right ventricle. Electronically Signed   By: San Morelle M.D.   On: 08/05/2018 16:49   Initial Impression / Assessment and Plan / ED Course  I have reviewed the triage vital signs and the nursing notes.  Pertinent labs & imaging results that were available during my care of the patient were reviewed by me and considered in my medical decision making (see chart for details).    Lab work consistent with anemia, with hemoglobin slightly more than 1 g lower than 6 days ago. Chest x-ray reviewed by me  Case discussed with [facility] cardiologist who will arrange for overnight stay. Pain is multifactorial, acute pericarditis and pneumothorax which is resolving  Patient mildly  hypotensive which improved with 500 mL intravenous bolus of normal saline solution Final Clinical Impressions(s) / ED Diagnoses  Diagnosis #1 acute pericarditis #2 hypotension #3 anemia #4 left-sided pneumothorax  Final diagnoses:  None    ED Discharge Orders    None       Orlie Dakin, MD 08/09/18 1502

## 2018-08-09 NOTE — Progress Notes (Signed)
  Echocardiogram 2D Echocardiogram has been performed.  Charles [name] 08/09/2018, 4:04 PM

## 2018-08-10 ENCOUNTER — Inpatient Hospital Stay (HOSPITAL_COMMUNITY)
Admission: EM | Disposition: A | Discharge status: Home / Self Care | Payer: Self-pay | Source: Home / Self Care | Attending: Cardiology

## 2018-08-10 ENCOUNTER — Other Ambulatory Visit: Payer: Self-pay

## 2018-08-10 DIAGNOSIS — I959 Hypotension, unspecified: Secondary | ICD-10-CM | POA: Diagnosis not present

## 2018-08-10 DIAGNOSIS — I351 Nonrheumatic aortic (valve) insufficiency: Secondary | ICD-10-CM | POA: Diagnosis not present

## 2018-08-10 DIAGNOSIS — Z95810 Presence of automatic (implantable) cardiac defibrillator: Secondary | ICD-10-CM | POA: Diagnosis not present

## 2018-08-10 DIAGNOSIS — Z888 Allergy status to other drugs, medicaments and biological substances status: Secondary | ICD-10-CM | POA: Diagnosis not present

## 2018-08-10 DIAGNOSIS — F0280 Dementia in other diseases classified elsewhere without behavioral disturbance: Secondary | ICD-10-CM | POA: Diagnosis not present

## 2018-08-10 DIAGNOSIS — J95811 Postprocedural pneumothorax: Secondary | ICD-10-CM | POA: Diagnosis present

## 2018-08-10 DIAGNOSIS — Y838 Other surgical procedures as the cause of abnormal reaction of the patient, or of later complication, without mention of misadventure at the time of the procedure: Secondary | ICD-10-CM | POA: Diagnosis not present

## 2018-08-10 DIAGNOSIS — I10 Essential (primary) hypertension: Secondary | ICD-10-CM | POA: Diagnosis not present

## 2018-08-10 DIAGNOSIS — Z950 Presence of cardiac pacemaker: Secondary | ICD-10-CM | POA: Diagnosis not present

## 2018-08-10 DIAGNOSIS — I472 Ventricular tachycardia: Secondary | ICD-10-CM | POA: Diagnosis not present

## 2018-08-10 DIAGNOSIS — I495 Sick sinus syndrome: Secondary | ICD-10-CM | POA: Diagnosis present

## 2018-08-10 DIAGNOSIS — Z79899 Other long term (current) drug therapy: Secondary | ICD-10-CM | POA: Diagnosis not present

## 2018-08-10 DIAGNOSIS — G3183 Dementia with Lewy bodies: Secondary | ICD-10-CM | POA: Diagnosis not present

## 2018-08-10 DIAGNOSIS — T82110A Breakdown (mechanical) of cardiac electrode, initial encounter: Secondary | ICD-10-CM | POA: Diagnosis not present

## 2018-08-10 DIAGNOSIS — T82897A Other specified complication of cardiac prosthetic devices, implants and grafts, initial encounter: Secondary | ICD-10-CM | POA: Diagnosis not present

## 2018-08-10 DIAGNOSIS — Z7982 Long term (current) use of aspirin: Secondary | ICD-10-CM | POA: Diagnosis not present

## 2018-08-10 DIAGNOSIS — I309 Acute pericarditis, unspecified: Secondary | ICD-10-CM | POA: Diagnosis not present

## 2018-08-10 DIAGNOSIS — J939 Pneumothorax, unspecified: Secondary | ICD-10-CM | POA: Diagnosis not present

## 2018-08-10 DIAGNOSIS — L8990 Pressure ulcer of unspecified site, unspecified stage: Secondary | ICD-10-CM

## 2018-08-10 DIAGNOSIS — N342 Other urethritis: Secondary | ICD-10-CM | POA: Diagnosis not present

## 2018-08-10 HISTORY — PX: LEAD REVISION/REPAIR: EP1213

## 2018-08-10 LAB — TROPONIN I: Troponin I: <0.03 ng/mL (ref <0.03)

## 2018-08-10 SURGERY — LEAD REVISION/REPAIR

## 2018-08-10 MED ORDER — ONDANSETRON HCL 4 MG/2ML IJ SOLN: 4.0000 mg | Freq: Four times a day (QID) | INTRAMUSCULAR | Status: DC | PRN

## 2018-08-10 MED ORDER — CEFAZOLIN SODIUM-DEXTROSE 2-4 GM/100ML-% IV SOLN
2.0000 g | INTRAVENOUS | Status: AC
  Administered 2018-08-10: 2 g via INTRAVENOUS

## 2018-08-10 MED ORDER — HEPARIN (PORCINE) IN NACL 1000-0.9 UT/500ML-% IV SOLN
INTRAVENOUS | Status: AC
  Filled 2018-08-10: qty 500

## 2018-08-10 MED ORDER — SODIUM CHLORIDE 0.9 % IV SOLN
INTRAVENOUS | Status: AC
  Administered 2018-08-10: 14:00:00 via INTRAVENOUS

## 2018-08-10 MED ORDER — CEFAZOLIN SODIUM-DEXTROSE 1-4 GM/50ML-% IV SOLN
1.0000 g | Freq: Four times a day (QID) | INTRAVENOUS | Status: AC
  Administered 2018-08-10 – 2018-08-11 (×3): 1 g via INTRAVENOUS
  Filled 2018-08-10 (×3): qty 50

## 2018-08-10 MED ORDER — SODIUM CHLORIDE 0.9 % IV SOLN
80.0000 mg | INTRAVENOUS | Status: AC
  Administered 2018-08-10: 80 mg

## 2018-08-10 MED ORDER — ACETAMINOPHEN 325 MG PO TABS
325.0000 mg | ORAL_TABLET | ORAL | Status: DC | PRN
  Administered 2018-08-10 – 2018-08-11 (×2): 650 mg via ORAL
  Filled 2018-08-10 (×2): qty 2

## 2018-08-10 MED ORDER — LIDOCAINE HCL 1 % IJ SOLN
INTRAMUSCULAR | Status: AC
  Filled 2018-08-10: qty 60

## 2018-08-10 MED ORDER — CEFAZOLIN SODIUM-DEXTROSE 2-4 GM/100ML-% IV SOLN
INTRAVENOUS | Status: AC
  Filled 2018-08-10: qty 100

## 2018-08-10 MED ORDER — SODIUM CHLORIDE 0.9 % IV SOLN
INTRAVENOUS | Status: AC
  Filled 2018-08-10: qty 2

## 2018-08-10 MED ORDER — LIDOCAINE HCL (PF) 1 % IJ SOLN
INTRAMUSCULAR | Status: DC | PRN
  Administered 2018-08-10: 45 mL

## 2018-08-10 SURGICAL SUPPLY — 8 items
CABLE SURGICAL S-101-97-12 (CABLE) ×2 IMPLANT
COVER DOME SNAP 22 D (MISCELLANEOUS) ×2 IMPLANT
HEMOSTAT SURGICEL 2X4 FIBR (HEMOSTASIS) ×2 IMPLANT
KIT MICROPUNCTURE NIT STIFF (SHEATH) ×2 IMPLANT
LEAD ISOFLEX OPT 1948-58CM (Lead) ×2 IMPLANT
PAD PRO RADIOLUCENT 2001M-C (PAD) ×2 IMPLANT
SHEATH CLASSIC 7F (SHEATH) ×2 IMPLANT
TRAY PACEMAKER INSERTION (PACKS) ×2 IMPLANT

## 2018-08-10 NOTE — Progress Notes (Signed)
Orthopedic Tech Progress Note Patient Details:  Charles [name] 07-20-1941 [id number]  Ortho Devices Type of Ortho Device: Arm sling       [name] 08/10/2018, 2:32 PM

## 2018-08-10 NOTE — Consult Note (Addendum)
Cardiology Consultation:   Patient ID: Charles [name] MRN: [id number]; DOB: Jul 11, 1941  Admit date: 08/09/2018 Date of Consult: 08/10/2018  Primary Care Provider: Jani Gravel, MD Primary Cardiologist: Sanda , MD  Primary Electrophysiologist:  None    Patient Profile:   Charles [name] is a 77 y.o. 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 Aundra Dubin.  History of Present Illness:   Charles [name] was referred outpatient recently with symptomatic bradycardia and recommended PPM implant which was done 08/05/18 with Dr. Sallyanne Kuster.  The procedure complicated by pneumothorax treated with chest tube day of implant and removed next day 08/06/18, and discharge 08/07/18, noting he had developed some penile bleeding, urology evaluated and suspected ecoli urethritis felt to be 2/2 condom cath use. Recommended Ceftriaxone 1g x1 dose, followed by cefdinir 300mg  BID for 5 days, to follow with urology.  The patient was brought back to [facility] with complaints of CP, sounded of pleuritic in description, EKG felt c/w pericarditis, and echo done Dr. Aundra Dubin noted small effusion without tamponade.  VSS, started on ASA, colchicine and admitted for observation  The patient is lying comfortably in bed.  Has no CP with normal respiration though does with deep breaths.  Denies feeling SOB in any way.  No palpitations, no reports of near syncope or syncope. Wife and daughter at bedside.   LABS K+ 4.0 BUN/Creat 21/1.07 Trop I: <0.03 (x2) WBC 9.5 H/H 12/37 (stable)   08/05/18: PPM implant, Dr. Sallyanne Kuster Device details: Generator St. Jude Assurity MRI model 2272 serial number [id number] Right atrial lead St. Jude Tendril MRI J544754 serial number [id number] Right ventricular  lead St. Jude Tendril MRI [id number] serial number [id number]  Past Medical History:  Diagnosis Date  . Acute pericarditis 08/09/2018   Admitted 11/19 for pericarditis; ? Related to recent pacer implant; small eff on echo/no tamponade  . Anxiety   . Aortic regurgitation   . Complication of anesthesia   . Depression   . Diastolic dysfunction   . Hypertension   . 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 SSS (sick sinus syndrome) ([place])     Past Surgical History:  Procedure Laterality Date  . acromioclavicular separation  07/04/2016  . CHEST TUBE INSERTION Left 08/05/2018  . EYE SURGERY    . HERNIA REPAIR    . NM MYOVIEW LTD    . PACEMAKER IMPLANT N/A 08/05/2018   Procedure: St Jude dual lead PACEMAKER IMPLANT;  Surgeon: Sanda , MD;  Location: [place] CV LAB;  Service: Cardiovascular;  Laterality: N/A;  . US ECHOCARDIOGRAPHY       Home Medications:  Prior to Admission medications   Medication Sig Start Date End Date Taking? Authorizing Provider  acetaminophen (TYLENOL) 325 MG tablet Take 650 mg by mouth every 6 (six) hours as needed for mild pain.   Yes [provider]  amLODipine (NORVASC) 2.5 MG tablet Take 2.5 mg by mouth daily. 05/01/16  Yes [provider]  aspirin EC 81 MG tablet Take 81 mg by mouth daily.   Yes [provider]  carbidopa-levodopa (SINEMET IR) 25-100 MG tablet Take 1 tablet by mouth 3 (three) times daily. (0830, 1330, & 2030) 06/12/18  Yes [provider]  cefdinir (OMNICEF) 300 MG capsule Take  1 capsule (300 mg total) by mouth every 12 (twelve) hours. 08/08/18  Yes Kroeger, Daleen Snook M., PA-C  donepezil (ARICEPT) 10 MG tablet Take 10 mg by mouth daily.  04/27/18  Yes [provider]  escitalopram (LEXAPRO) 10 MG tablet Take 10 mg by mouth at bedtime.    Yes [provider]  L-Methylfolate-B12-B6-B2 (CEREFOLIN) 02-28-49-5 MG TABS Take 1 tablet by mouth daily.     Yes [provider]  Multiple Vitamin (MULTIVITAMIN WITH MINERALS) TABS tablet Take 1 tablet by mouth daily.   Yes [provider]  quinapril (ACCUPRIL) 40 MG tablet Take 40 mg by mouth 2 (two) times daily.   Yes [provider]    Inpatient Medications: Scheduled Meds: . amLODipine  2.5 mg Oral Daily  . carbidopa-levodopa  1 tablet Oral TID  . cefdinir  300 mg Oral Q12H  . colchicine  0.6 mg Oral BID  . donepezil  10 mg Oral Daily  . escitalopram  10 mg Oral QHS  . multivitamin with minerals  1 tablet Oral Daily  . quinapril  40 mg Oral BID  . sodium chloride flush  3 mL Intravenous Q12H   Continuous Infusions: . sodium chloride     PRN Meds: sodium chloride, acetaminophen, ondansetron (ZOFRAN) IV, sodium chloride flush  Allergies:    Allergies  Allergen Reactions  . Propofol Anaphylaxis    Social History:   Social History   Socioeconomic History  . Marital status: Married    Spouse name: Not on file  . Number of children: 3  . Years of education: Not on file  . Highest education level: Associate degree: academic program  Occupational History  . Not on file  Social Needs  . Financial resource strain: Not on file  . Food insecurity:    Worry: Not on file    Inability: Not on file  . Transportation needs:    Medical: Not on file    Non-medical: Not on file  Tobacco Use  . Smoking status: Never Smoker  . Smokeless tobacco: Never Used  Substance and Sexual Activity  . Alcohol use: No  . Drug use: No  . Sexual activity: Not on file  Lifestyle  . Physical activity:    Days per week: Not on file    Minutes per session: Not on file  . Stress: Not on file  Relationships  . Social connections:    Talks on phone: Not on file    Gets together: Not on file    Attends religious service: Not on file    Active member of club or organization: Not on file    Attends meetings of clubs or organizations: Not on file    Relationship status:  Not on file  . Intimate partner violence:    Fear of current or ex partner: Not on file    Emotionally abused: Not on file    Physically abused: Not on file    Forced sexual activity: Not on file  Other Topics Concern  . Not on file  Social History Narrative   Lives at home with his wife   Right handed   No caffeine     Family History:   Family History  Problem Relation Age of Onset  . Hypertension Mother   . Lung cancer Mother   . Diabetes Mother   . Thyroid disease Mother   . Angina Father   . Heart attack Father   . Heart disease Brother   . Diabetes  Brother   . Heart attack Brother   . Asthma Maternal Grandmother   . Stroke Maternal Grandfather   . Diabetes Paternal Grandfather   . Heart Problems Brother   . Dementia Neg Hx      ROS:  Please see the history of present illness.  All other ROS reviewed and negative.     Physical Exam/Data:   Vitals:   08/09/18 1711 08/09/18 2100 08/10/18 0250 08/10/18 0443  BP: 133/68 101/60  (!) 97/56  Pulse: 75 79  68  Resp: 18 18  15   Temp: 98.4 F (36.9 C) 98.9 F (37.2 C)  98.5 F (36.9 C)  TempSrc: Oral Oral  Oral  SpO2: 100% 98%  99%  Weight:   50.8 kg   Height:        Intake/Output Summary (Last 24 hours) at 08/10/2018 0745 Last data filed at 08/09/2018 2032 Gross per 24 hour  Intake 200 ml  Output 200 ml  Net 0 ml   Filed Weights   08/09/18 1250 08/10/18 0250  Weight: 50 kg 50.8 kg   Body mass index is 16.52 kg/m.  General:  Well nourished, very thin body habitus, somewhat cachetic in appearance, in no acute distress HEENT: normal Lymph: no adenopathy Neck: no JVD is appreciated Endocrine:  No thryomegaly Vascular: No carotid bruits Cardiac:  RRR; no murmurs, I do not hear a rub, heart sounds are not distant Lungs:  CTA b/l, no wheezing, rhonchi or rales  Abd: soft, nontender Ext: no edema Musculoskeletal:  No deformities, advanced atrophy Skin: warm and dry  Neuro:  no focal abnormalities  noted Psych:  Normal affect   EKG:  The EKG was personally reviewed and demonstrates:   #1 is SR 72bpm, diffuse ST elevation #2 is SR 72bpm, more pronounced ST elevation Today SR 67bpm, with persistent ST changes, same/perhaps more pronounced changes of pericarditis Telemetry:  Telemetry was personally reviewed and demonstrates:   SR 60's  Relevant CV Studies:  08/09/18: TTE Study Conclusions - Left ventricle: Systolic function was normal. The estimated   ejection fraction was in the range of 55% to 60%. Wall motion was   normal; there were no regional wall motion abnormalities. - Mitral valve: Mildly calcified annulus. There was trivial   regurgitation. - Right ventricle: Pacer wire or catheter noted in right ventricle. - Inferior vena cava: The vessel was dilated. - Pericardium, extracardiac: A small to moderate pericardial   effusion was identified circumferential to the heart. No obvious   RV compression or definitive signs of tamponade  Laboratory Data:  Chemistry Recent Labs  Lab 08/03/18 1135 08/09/18 1311 08/09/18 1844  NA 141 136 136  K 5.1 4.1 4.0  CL 97 97* 99  CO2 27  --  27  GLUCOSE 85 116* 192*  BUN 18 27* 21  CREATININE 1.08 1.10 1.07  CALCIUM 9.8  --  9.0  GFRNONAA 66  --  >60  GFRAA 77  --  >60  ANIONGAP  --   --  10    Recent Labs  Lab 08/09/18 1844  PROT 5.7*  ALBUMIN 3.1*  AST 46*  ALT 21  ALKPHOS 54  BILITOT 1.1   Hematology Recent Labs  Lab 08/03/18 1135 08/09/18 1258 08/09/18 1311  WBC 7.2 9.5  --   RBC 4.09* 3.83*  --   HGB 13.1 11.9* 12.6*  HCT 38.3 38.0* 37.0*  MCV 94 99.2  --   MCH 32.0 31.1  --   MCHC 34.2  31.3  --   RDW 12.5 13.9  --   PLT 204 178  --    Cardiac Enzymes Recent Labs  Lab 08/09/18 1844 08/09/18 2213  TROPONINI <0.03 <0.03    Recent Labs  Lab 08/09/18 1309  TROPIPOC 0.00    BNPNo results for input(s): BNP, PROBNP in the last 168 hours.  DDimer No results for input(s): DDIMER in the last 168  hours.  Radiology/Studies:   Dg Chest Port 1 View Result Date: 08/09/2018 CLINICAL DATA:  Pt complains of chest pain when he breathes and SOB since yesterday. Pt reports that he had a pacemaker placed on Wednesday and a chest tube removed on Friday. non-smoker. Hx of HTN EXAM: PORTABLE CHEST 1 VIEW COMPARISON:  08/07/2018 FINDINGS: Tiny left apical pneumothorax, decreased in size from the prior exam. Lungs are hyperexpanded mildly prominent bronchovascular markings, but otherwise clear. Cardiac silhouette is normal in size. No mediastinal hilar masses. Stable left anterior chest wall sequential pacemaker. IMPRESSION: 1. Further decrease in the minimal left apical pneumothorax when compared to the prior study. 2. No other change.  Lungs remain clear. Electronically Signed   By: Lajean Manes M.D.   On: 08/09/2018 13:38     Assessment and Plan:   1. Pericarditis, likely microperforation with new PPM implant      Dr. Aundra Dubin felt echo noted small pericardial effusion, echo read as small to mod pericardial effusion, no tamponade     VSS, he is not tachycardic, no JVD appreciated     Hold BP meds for now     Keep NPO     Check device/leads     Likely to need lead revision  I will put on today's schedule, Dr. Caryl Comes will see   2. PPM implant 08/05/18     Pneumothorax with chest tube (x1 day)     CXR reads this as stable, smaller  3. Advancing dementia     Patient is AAO x3 this morning     Discussed with the patient and his wife and daughter at bedside, likely need for lead revision     They are agreeable should it be decided once d/w Dr. Caryl Comes        For questions or updates, please contact [facility] Please consult [email or web address] for contact info under     Signed, Baldwin Jamaica, PA-C  08/10/2018 7:45 AM   Sinus node dysfunction  PM implant 11/6 Cx by PTX requiring chest tube and subsequent likely RV perforation with elevated RV thresholds  Dementia   Hypertension    Pericardial effusion without tamponade   The benefits and risks were reviewed including but not limited to death,  perforation, infection, lead dislodgement, and pericardial effusion and device malfunction.  The patient understands agrees and is willing to proceed.

## 2018-08-11 ENCOUNTER — Inpatient Hospital Stay (HOSPITAL_COMMUNITY): Payer: Medicare Other

## 2018-08-11 ENCOUNTER — Encounter (HOSPITAL_COMMUNITY): Payer: Self-pay | Admitting: Internal Medicine

## 2018-08-11 MED FILL — Lidocaine HCl Local Inj 1%: INTRAMUSCULAR | Qty: 60 | Status: AC

## 2018-08-11 MED FILL — Gentamicin Sulfate Inj 40 MG/ML: INTRAMUSCULAR | Qty: 80 | Status: AC

## 2018-08-11 NOTE — Discharge Summary (Addendum)
ELECTROPHYSIOLOGY PROCEDURE DISCHARGE SUMMARY    Patient ID: Charles [name],  MRN: [id number], DOB/AGE: Jan 11, 1941 77 y.o.  Admit date: 08/09/2018 Discharge date: 08/11/2018  Primary Care Physician: Jani Gravel, MD  Primary Cardiologist: Dr. Sallyanne Kuster  Primary Discharge Diagnosis:  1. Pericardial effusion 2. PPM Lead perforation  Secondary Discharge Diagnosis:  1. Dementia 2. HTN     Of late (prior to initial PPM implant with relative hypotension)  Allergies  Allergen Reactions  . Propofol Anaphylaxis     Procedures This Admission:  1.  PPM RV lead revision, 08/10/18, Dr. Caryl Comes      See report for full details 2.  CXR on 08/11/18 demonstrated resolved pneumothorax, and no new ptx status post device revision.   Brief HPI: Charles [name] is a 77 y.o. 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 Sallyanne Kuster.  The procedure complicated by pneumothorax treated with chest tube day of implant and removed next day 08/06/18, and discharge 08/07/18.  He was brought back to [facility] with complaints of CP, sounded of pleuritic in description, EKG felt c/w pericarditis, and echo done Dr. Aundra Dubin noted small effusion without tamponade.  VSS, started on ASA, colchicine and admitted for observation  Hospital Course:  The patient was VSS remained stable, limited echo read as LVEF 55-60%, small tyo mod pericardial effusion no obvious RV compression or definitive signs of tamponade.  Device check noted a sharp increase in RV pacing thresholds and felt best to do lead revision.  He underwent the procedure yesterday with Dr. Caryl Comes, see full procedure report for for details.  He was monitored on telemetry through his stay which demonstrated SR.  Left chest was without hematoma or ecchymosis.  The device was interrogated and found to be functioning normally.  CXR  was obtained and demonstrated no pneumothorax (prior pneumothorax resolved) status post device implantation.  Wound care, arm mobility, and restrictions were reviewed with the patient and his wife at bedside.  The patient feels much improved, no ongoing pleuritic CP this morning, no SOB.  Mild PPM site discomfort well controlled with Tylenol this morning.  The patient was examined by Dr. Caryl Comes and considered stable for discharge to home.   Prior antibiotic course for suspected prior urethritis will complete tomorrow, and is instructed to call his PMD for follow up with urology as previously recommended.  His home ACE is being discontinued with relative low BP observed even prior to his initial PPM implant.   Physical Exam: Vitals:   08/10/18 1721 08/10/18 1934 08/11/18 0045 08/11/18 0552  BP: 104/63 (!) 93/52 (!) 101/57 113/64  Pulse: 62 63 63 67  Resp: 18 18 18 19   Temp: (!) 97.5 F (36.4 C) 98.4 F (36.9 C) 98.3 F (36.8 C) 98.4 F (36.9 C)  TempSrc: Oral Oral Oral Oral  SpO2: 99% 98% 97% 96%  Weight:    50.7 kg  Height:        GEN- The patient is well appearing, alert and oriented x 3 today.   HEENT: normocephalic, atraumatic; sclera clear, conjunctiva pink; hearing intact; oropharynx clear; neck supple, no JVP Lungs- CTA b/l, normal work of breathing.  No wheezes, rales, rhonchi Heart- RRR, no murmurs, NO rubs or gallops, PMI not laterally displaced GI- soft, non-tender, non-distended Extremities- no clubbing, cyanosis, or edema MS- no significant deformity, advanced atrophy Skin- warm and dry, no rash  or lesion, left chest without hematoma/ecchymosis Psych- euthymic mood, full affect Neuro- no gross deficits   Labs:   Lab Results  Component Value Date   WBC 9.5 08/09/2018   HGB 12.6 (L) 08/09/2018   HCT 37.0 (L) 08/09/2018   MCV 99.2 08/09/2018   PLT 178 08/09/2018    Recent Labs  Lab 08/09/18 1844  NA 136  K 4.0  CL 99  CO2 27  BUN 21  CREATININE 1.07    CALCIUM 9.0  PROT 5.7*  BILITOT 1.1  ALKPHOS 54  ALT 21  AST 46*  GLUCOSE 192*    Discharge Medications:  Allergies as of 08/11/2018      Reactions   Propofol Anaphylaxis      Medication List    STOP taking these medications   quinapril 40 MG tablet Commonly known as:  ACCUPRIL     TAKE these medications   acetaminophen 325 MG tablet Commonly known as:  TYLENOL Take 650 mg by mouth every 6 (six) hours as needed for mild pain.   amLODipine 2.5 MG tablet Commonly known as:  NORVASC Take 2.5 mg by mouth daily.   aspirin EC 81 MG tablet Take 81 mg by mouth daily.   carbidopa-levodopa 25-100 MG tablet Commonly known as:  SINEMET IR Take 1 tablet by mouth 3 (three) times daily. (0830, 1330, & 2030)   cefdinir 300 MG capsule Commonly known as:  OMNICEF Take 1 capsule (300 mg total) by mouth every 12 (twelve) hours. Notes to patient:  Continue this medicine through tomorrow.  Last dose tomorrow 08/12/18 evening.   CEREFOLIN 02-28-49-5 MG Tabs Take 1 tablet by mouth daily.   donepezil 10 MG tablet Commonly known as:  ARICEPT Take 10 mg by mouth daily.   escitalopram 10 MG tablet Commonly known as:  LEXAPRO Take 10 mg by mouth at bedtime.   multivitamin with minerals Tabs tablet Take 1 tablet by mouth daily.       Disposition: Home Discharge Instructions    Diet - low sodium heart healthy   Complete by:  As directed    Increase activity slowly   Complete by:  As directed      Follow-up Information    [facility] Follow up.   Specialty:  Cardiology Why:  08/17/18 @ 10:00AM, wound check visit Contact information: [address], [place] [phone]       Sanda Klein, MD Follow up.   Specialty:  Cardiology Why:  11/11/18 @ 10:00AM Contact information: [address] [place] [address] [phone]        Jani Gravel, MD Follow up.   Specialty:  Internal  Medicine Why:  Please call to arrange urology follow up as instructed from prior hospital stay.  Should be seen in then next 7-10days Contact information: [place] [address] [phone]           Duration of Discharge Encounter: Greater than 30 minutes including physician time.  Venetia Night, PA-C 08/11/2018 10:05 AM

## 2018-08-11 NOTE — Discharge Instructions (Signed)
° ° °  Supplemental Discharge Instructions for  Pacemaker/Defibrillator Patients  Activity No heavy lifting or vigorous activity with your left/right arm for 6 to 8 weeks.  Do not raise your left/right arm above your head for one week.  Gradually raise your affected arm as drawn below.             08/14/18                   08/15/18                  08/16/18                08/17/18 __  NO DRIVING patient does not drive.  WOUND CARE - Keep the wound area clean and dry.  Do not get this area wet for 24 hours. No showers until 08/11/18 evening. - The tape/steri-strips on your wound will fall off; do not pull them off.  No bandage is needed on the site.  DO  NOT apply any creams, oils, or ointments to the wound area. - If you notice any drainage or discharge from the wound, any swelling or bruising at the site, or you develop a fever > 101? F after you are discharged home, call the office at once.  Special Instructions - You are still able to use cellular telephones; use the ear opposite the side where you have your pacemaker/defibrillator.  Avoid carrying your cellular phone near your device. - When traveling through airports, show security personnel your identification card to avoid being screened in the metal detectors.  Ask the security personnel to use the hand wand. - Avoid arc welding equipment, MRI testing (magnetic resonance imaging), TENS units (transcutaneous nerve stimulators).  Call the office for questions about other devices. - Avoid electrical appliances that are in poor condition or are not properly grounded. - Microwave ovens are safe to be near or to operate.

## 2018-08-11 NOTE — Progress Notes (Signed)
Pt discharge instructions reviewed with pt and his wife. Pt and wife verbalize understanding and state they have no questions. Pt belongings with pt. Pt is not in distress. Pt daughter is driving him home.

## 2018-08-13 DIAGNOSIS — Z950 Presence of cardiac pacemaker: Secondary | ICD-10-CM | POA: Diagnosis not present

## 2018-08-13 DIAGNOSIS — L89151 Pressure ulcer of sacral region, stage 1: Secondary | ICD-10-CM | POA: Diagnosis not present

## 2018-08-13 DIAGNOSIS — I10 Essential (primary) hypertension: Secondary | ICD-10-CM | POA: Diagnosis not present

## 2018-08-13 DIAGNOSIS — G309 Alzheimer's disease, unspecified: Secondary | ICD-10-CM | POA: Diagnosis not present

## 2018-08-13 DIAGNOSIS — R0989 Other specified symptoms and signs involving the circulatory and respiratory systems: Secondary | ICD-10-CM | POA: Diagnosis not present

## 2018-08-13 NOTE — Consult Note (Signed)
            [facility] CM Primary Care Navigator  08/13/2018  Leevi Cullars [facility] 12/26/1940 [id number]   Attempt to seepatient at the bedside to identify possible discharge needs buthe wasalready discharged home.  Per MD note, patient was admitted for complaints of chest pain with echo showing small effusion without tamponade. (pericardial effusion, permanent pacemaker lead perforation, underwent permanent pacemaker RV lead revision and complicated by pneumothorax treated with chest tube day of implant and removed the next day)  Patient has discharge instruction to follow-up withprimary care provider in 7- 10 days, cardiology follow-up on 08/17/18.   Primary care provider's office is listed as providing transition of care (TOC) follow-up.   For additional questions please contact:  Edwena Felty A. Martina Brodbeck, BSN, RN-BC [facility] PRIMARY CARE Navigator Cell: [phone]

## 2018-08-17 ENCOUNTER — Ambulatory Visit (INDEPENDENT_AMBULATORY_CARE_PROVIDER_SITE_OTHER): Payer: Medicare Other | Admitting: *Deleted

## 2018-08-17 DIAGNOSIS — I495 Sick sinus syndrome: Secondary | ICD-10-CM | POA: Diagnosis not present

## 2018-08-17 LAB — CUP PACEART INCLINIC DEVICE CHECK
Battery Voltage: 3.1 V
Brady Statistic RV Percent Paced: 0.12 %
Date Time Interrogation Session: 20191118112622
Implantable Lead Implant Date: 20191111
Implantable Lead Location: 753860
Lead Channel Impedance Value: 750 Ohm
Lead Channel Pacing Threshold Amplitude: 0.5 V
Lead Channel Pacing Threshold Amplitude: 0.5 V
Lead Channel Pacing Threshold Pulse Width: 0.5 ms
Lead Channel Setting Pacing Amplitude: 1.5 V
Lead Channel Setting Sensing Sensitivity: 0.5 mV
MDC IDC LEAD IMPLANT DT: 20191106
MDC IDC LEAD LOCATION: 753859
MDC IDC MSMT BATTERY REMAINING LONGEVITY: 135 mo
MDC IDC MSMT LEADCHNL RA IMPEDANCE VALUE: 425 Ohm
MDC IDC MSMT LEADCHNL RA PACING THRESHOLD AMPLITUDE: 0.5 V
MDC IDC MSMT LEADCHNL RA PACING THRESHOLD PULSEWIDTH: 0.5 ms
MDC IDC MSMT LEADCHNL RA PACING THRESHOLD PULSEWIDTH: 0.5 ms
MDC IDC MSMT LEADCHNL RA SENSING INTR AMPL: 2.1 mV
MDC IDC MSMT LEADCHNL RV PACING THRESHOLD AMPLITUDE: 0.5 V
MDC IDC MSMT LEADCHNL RV PACING THRESHOLD PULSEWIDTH: 0.5 ms
MDC IDC MSMT LEADCHNL RV SENSING INTR AMPL: 4.3 mV
MDC IDC PG IMPLANT DT: 20191106
MDC IDC SET LEADCHNL RV PACING AMPLITUDE: 0.625
MDC IDC SET LEADCHNL RV PACING PULSEWIDTH: 0.5 ms
MDC IDC STAT BRADY RA PERCENT PACED: 68 %
Pulse Gen Model: 2272
Pulse Gen Serial Number: 9083517

## 2018-08-17 NOTE — Progress Notes (Signed)
Wound check appointment. Steri-strips removed. Wound without redness or edema. Incision edges approximated, wound well healed. Normal device function. Thresholds, sensing, and impedances consistent with implant measurements. Device programmed with auto capture programmed. Histogram distribution appropriate for patient and level of activity. No mode switches or high ventricular rates noted. Patient educated about wound care, arm mobility, lifting restrictions. ROV 11/11/2018 w/ [facility]

## 2018-08-18 DIAGNOSIS — H543 Unqualified visual loss, both eyes: Secondary | ICD-10-CM | POA: Diagnosis not present

## 2018-08-18 DIAGNOSIS — F0280 Dementia in other diseases classified elsewhere without behavioral disturbance: Secondary | ICD-10-CM | POA: Diagnosis not present

## 2018-08-18 DIAGNOSIS — N342 Other urethritis: Secondary | ICD-10-CM | POA: Diagnosis not present

## 2018-08-18 DIAGNOSIS — Z7982 Long term (current) use of aspirin: Secondary | ICD-10-CM | POA: Diagnosis not present

## 2018-08-18 DIAGNOSIS — I10 Essential (primary) hypertension: Secondary | ICD-10-CM | POA: Diagnosis not present

## 2018-08-18 DIAGNOSIS — Z950 Presence of cardiac pacemaker: Secondary | ICD-10-CM | POA: Diagnosis not present

## 2018-08-18 DIAGNOSIS — D649 Anemia, unspecified: Secondary | ICD-10-CM | POA: Diagnosis not present

## 2018-08-18 DIAGNOSIS — Z4800 Encounter for change or removal of nonsurgical wound dressing: Secondary | ICD-10-CM | POA: Diagnosis not present

## 2018-08-18 DIAGNOSIS — T829XXD Unspecified complication of cardiac and vascular prosthetic device, implant and graft, subsequent encounter: Secondary | ICD-10-CM | POA: Diagnosis not present

## 2018-08-18 DIAGNOSIS — G3183 Dementia with Lewy bodies: Secondary | ICD-10-CM | POA: Diagnosis not present

## 2018-08-18 DIAGNOSIS — L89152 Pressure ulcer of sacral region, stage 2: Secondary | ICD-10-CM | POA: Diagnosis not present

## 2018-08-20 DIAGNOSIS — L89152 Pressure ulcer of sacral region, stage 2: Secondary | ICD-10-CM | POA: Diagnosis not present

## 2018-08-20 DIAGNOSIS — I10 Essential (primary) hypertension: Secondary | ICD-10-CM | POA: Diagnosis not present

## 2018-08-20 DIAGNOSIS — F0280 Dementia in other diseases classified elsewhere without behavioral disturbance: Secondary | ICD-10-CM | POA: Diagnosis not present

## 2018-08-20 DIAGNOSIS — G3183 Dementia with Lewy bodies: Secondary | ICD-10-CM | POA: Diagnosis not present

## 2018-08-20 DIAGNOSIS — T829XXD Unspecified complication of cardiac and vascular prosthetic device, implant and graft, subsequent encounter: Secondary | ICD-10-CM | POA: Diagnosis not present

## 2018-08-20 DIAGNOSIS — N342 Other urethritis: Secondary | ICD-10-CM | POA: Diagnosis not present

## 2018-08-25 DIAGNOSIS — T829XXD Unspecified complication of cardiac and vascular prosthetic device, implant and graft, subsequent encounter: Secondary | ICD-10-CM | POA: Diagnosis not present

## 2018-08-25 DIAGNOSIS — G3183 Dementia with Lewy bodies: Secondary | ICD-10-CM | POA: Diagnosis not present

## 2018-08-25 DIAGNOSIS — N342 Other urethritis: Secondary | ICD-10-CM | POA: Diagnosis not present

## 2018-08-25 DIAGNOSIS — F0280 Dementia in other diseases classified elsewhere without behavioral disturbance: Secondary | ICD-10-CM | POA: Diagnosis not present

## 2018-08-25 DIAGNOSIS — I10 Essential (primary) hypertension: Secondary | ICD-10-CM | POA: Diagnosis not present

## 2018-08-25 DIAGNOSIS — L89152 Pressure ulcer of sacral region, stage 2: Secondary | ICD-10-CM | POA: Diagnosis not present

## 2018-08-28 DIAGNOSIS — L89152 Pressure ulcer of sacral region, stage 2: Secondary | ICD-10-CM | POA: Diagnosis not present

## 2018-08-28 DIAGNOSIS — F0280 Dementia in other diseases classified elsewhere without behavioral disturbance: Secondary | ICD-10-CM | POA: Diagnosis not present

## 2018-08-28 DIAGNOSIS — N342 Other urethritis: Secondary | ICD-10-CM | POA: Diagnosis not present

## 2018-08-28 DIAGNOSIS — I10 Essential (primary) hypertension: Secondary | ICD-10-CM | POA: Diagnosis not present

## 2018-08-28 DIAGNOSIS — T829XXD Unspecified complication of cardiac and vascular prosthetic device, implant and graft, subsequent encounter: Secondary | ICD-10-CM | POA: Diagnosis not present

## 2018-08-28 DIAGNOSIS — G3183 Dementia with Lewy bodies: Secondary | ICD-10-CM | POA: Diagnosis not present

## 2018-09-01 DIAGNOSIS — I10 Essential (primary) hypertension: Secondary | ICD-10-CM | POA: Diagnosis not present

## 2018-09-01 DIAGNOSIS — F0280 Dementia in other diseases classified elsewhere without behavioral disturbance: Secondary | ICD-10-CM | POA: Diagnosis not present

## 2018-09-01 DIAGNOSIS — T829XXD Unspecified complication of cardiac and vascular prosthetic device, implant and graft, subsequent encounter: Secondary | ICD-10-CM | POA: Diagnosis not present

## 2018-09-01 DIAGNOSIS — L89152 Pressure ulcer of sacral region, stage 2: Secondary | ICD-10-CM | POA: Diagnosis not present

## 2018-09-01 DIAGNOSIS — N342 Other urethritis: Secondary | ICD-10-CM | POA: Diagnosis not present

## 2018-09-01 DIAGNOSIS — G3183 Dementia with Lewy bodies: Secondary | ICD-10-CM | POA: Diagnosis not present

## 2018-09-03 DIAGNOSIS — G3183 Dementia with Lewy bodies: Secondary | ICD-10-CM | POA: Diagnosis not present

## 2018-09-03 DIAGNOSIS — F0280 Dementia in other diseases classified elsewhere without behavioral disturbance: Secondary | ICD-10-CM | POA: Diagnosis not present

## 2018-09-03 DIAGNOSIS — T829XXD Unspecified complication of cardiac and vascular prosthetic device, implant and graft, subsequent encounter: Secondary | ICD-10-CM | POA: Diagnosis not present

## 2018-09-03 DIAGNOSIS — I10 Essential (primary) hypertension: Secondary | ICD-10-CM | POA: Diagnosis not present

## 2018-09-03 DIAGNOSIS — L89152 Pressure ulcer of sacral region, stage 2: Secondary | ICD-10-CM | POA: Diagnosis not present

## 2018-09-03 DIAGNOSIS — N342 Other urethritis: Secondary | ICD-10-CM | POA: Diagnosis not present

## 2018-09-04 DIAGNOSIS — Z79899 Other long term (current) drug therapy: Secondary | ICD-10-CM | POA: Diagnosis not present

## 2018-09-04 DIAGNOSIS — R634 Abnormal weight loss: Secondary | ICD-10-CM | POA: Diagnosis not present

## 2018-09-04 DIAGNOSIS — Z681 Body mass index (BMI) 19 or less, adult: Secondary | ICD-10-CM | POA: Diagnosis not present

## 2018-09-04 DIAGNOSIS — G479 Sleep disorder, unspecified: Secondary | ICD-10-CM | POA: Diagnosis not present

## 2018-09-04 DIAGNOSIS — F0281 Dementia in other diseases classified elsewhere with behavioral disturbance: Secondary | ICD-10-CM | POA: Diagnosis not present

## 2018-09-04 DIAGNOSIS — R453 Demoralization and apathy: Secondary | ICD-10-CM | POA: Diagnosis not present

## 2018-09-04 DIAGNOSIS — G3183 Dementia with Lewy bodies: Secondary | ICD-10-CM | POA: Diagnosis not present

## 2018-09-07 DIAGNOSIS — G3183 Dementia with Lewy bodies: Secondary | ICD-10-CM | POA: Diagnosis not present

## 2018-09-07 DIAGNOSIS — L89152 Pressure ulcer of sacral region, stage 2: Secondary | ICD-10-CM | POA: Diagnosis not present

## 2018-09-07 DIAGNOSIS — F0280 Dementia in other diseases classified elsewhere without behavioral disturbance: Secondary | ICD-10-CM | POA: Diagnosis not present

## 2018-09-07 DIAGNOSIS — T829XXD Unspecified complication of cardiac and vascular prosthetic device, implant and graft, subsequent encounter: Secondary | ICD-10-CM | POA: Diagnosis not present

## 2018-09-07 DIAGNOSIS — I10 Essential (primary) hypertension: Secondary | ICD-10-CM | POA: Diagnosis not present

## 2018-09-07 DIAGNOSIS — N342 Other urethritis: Secondary | ICD-10-CM | POA: Diagnosis not present

## 2018-09-09 DIAGNOSIS — N342 Other urethritis: Secondary | ICD-10-CM | POA: Diagnosis not present

## 2018-09-09 DIAGNOSIS — F0280 Dementia in other diseases classified elsewhere without behavioral disturbance: Secondary | ICD-10-CM | POA: Diagnosis not present

## 2018-09-09 DIAGNOSIS — L89152 Pressure ulcer of sacral region, stage 2: Secondary | ICD-10-CM | POA: Diagnosis not present

## 2018-09-09 DIAGNOSIS — T829XXD Unspecified complication of cardiac and vascular prosthetic device, implant and graft, subsequent encounter: Secondary | ICD-10-CM | POA: Diagnosis not present

## 2018-09-09 DIAGNOSIS — G3183 Dementia with Lewy bodies: Secondary | ICD-10-CM | POA: Diagnosis not present

## 2018-09-09 DIAGNOSIS — I10 Essential (primary) hypertension: Secondary | ICD-10-CM | POA: Diagnosis not present

## 2018-09-15 DIAGNOSIS — G3183 Dementia with Lewy bodies: Secondary | ICD-10-CM | POA: Diagnosis not present

## 2018-09-15 DIAGNOSIS — N342 Other urethritis: Secondary | ICD-10-CM | POA: Diagnosis not present

## 2018-09-15 DIAGNOSIS — I10 Essential (primary) hypertension: Secondary | ICD-10-CM | POA: Diagnosis not present

## 2018-09-15 DIAGNOSIS — F0280 Dementia in other diseases classified elsewhere without behavioral disturbance: Secondary | ICD-10-CM | POA: Diagnosis not present

## 2018-09-15 DIAGNOSIS — L89152 Pressure ulcer of sacral region, stage 2: Secondary | ICD-10-CM | POA: Diagnosis not present

## 2018-09-15 DIAGNOSIS — T829XXD Unspecified complication of cardiac and vascular prosthetic device, implant and graft, subsequent encounter: Secondary | ICD-10-CM | POA: Diagnosis not present

## 2018-09-17 DIAGNOSIS — F0280 Dementia in other diseases classified elsewhere without behavioral disturbance: Secondary | ICD-10-CM | POA: Diagnosis not present

## 2018-09-17 DIAGNOSIS — I10 Essential (primary) hypertension: Secondary | ICD-10-CM | POA: Diagnosis not present

## 2018-09-17 DIAGNOSIS — G3183 Dementia with Lewy bodies: Secondary | ICD-10-CM | POA: Diagnosis not present

## 2018-09-17 DIAGNOSIS — T829XXD Unspecified complication of cardiac and vascular prosthetic device, implant and graft, subsequent encounter: Secondary | ICD-10-CM | POA: Diagnosis not present

## 2018-09-17 DIAGNOSIS — N342 Other urethritis: Secondary | ICD-10-CM | POA: Diagnosis not present

## 2018-09-17 DIAGNOSIS — L89152 Pressure ulcer of sacral region, stage 2: Secondary | ICD-10-CM | POA: Diagnosis not present

## 2018-09-22 DIAGNOSIS — F0280 Dementia in other diseases classified elsewhere without behavioral disturbance: Secondary | ICD-10-CM | POA: Diagnosis not present

## 2018-09-22 DIAGNOSIS — G3183 Dementia with Lewy bodies: Secondary | ICD-10-CM | POA: Diagnosis not present

## 2018-09-22 DIAGNOSIS — L89152 Pressure ulcer of sacral region, stage 2: Secondary | ICD-10-CM | POA: Diagnosis not present

## 2018-09-22 DIAGNOSIS — T829XXD Unspecified complication of cardiac and vascular prosthetic device, implant and graft, subsequent encounter: Secondary | ICD-10-CM | POA: Diagnosis not present

## 2018-09-22 DIAGNOSIS — I10 Essential (primary) hypertension: Secondary | ICD-10-CM | POA: Diagnosis not present

## 2018-09-22 DIAGNOSIS — N342 Other urethritis: Secondary | ICD-10-CM | POA: Diagnosis not present

## 2018-10-01 DIAGNOSIS — F0280 Dementia in other diseases classified elsewhere without behavioral disturbance: Secondary | ICD-10-CM | POA: Diagnosis not present

## 2018-10-01 DIAGNOSIS — N342 Other urethritis: Secondary | ICD-10-CM | POA: Diagnosis not present

## 2018-10-01 DIAGNOSIS — I10 Essential (primary) hypertension: Secondary | ICD-10-CM | POA: Diagnosis not present

## 2018-10-01 DIAGNOSIS — L89152 Pressure ulcer of sacral region, stage 2: Secondary | ICD-10-CM | POA: Diagnosis not present

## 2018-10-01 DIAGNOSIS — G3183 Dementia with Lewy bodies: Secondary | ICD-10-CM | POA: Diagnosis not present

## 2018-10-01 DIAGNOSIS — T829XXD Unspecified complication of cardiac and vascular prosthetic device, implant and graft, subsequent encounter: Secondary | ICD-10-CM | POA: Diagnosis not present

## 2018-10-03 IMAGING — CT NM PET BRAIN AMYLOID
5 series · 21 of 25 positions shown · non-contrast
Comparison: Brain MRI 09/19/2017

CLINICAL DATA: Dimension cognitive change. [age]-year-old male.

EXAM:
NM PET METABOLIC BRAIN
TECHNIQUE: 9.8 mCi F-18 FDG was injected intravenously via the antecubital
fossa. Full-ring PET imaging was performed from the vertex to the
skull base. CT data was obtained and used for attenuation correction
and anatomic localization.

[Series 3: pet brain ac · axial · 3.0mm · 2.04mm/px · z∈[-525,-357]mm · 6 of 85 slices shown]
[im 1/85]
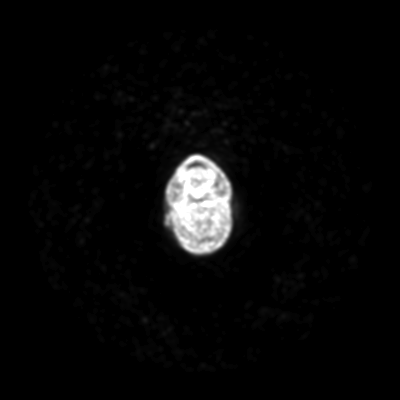
[im 15/85]
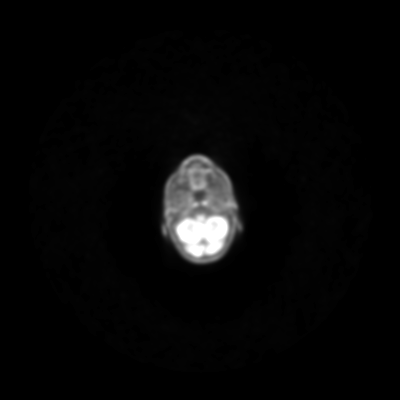
[im 29/85]
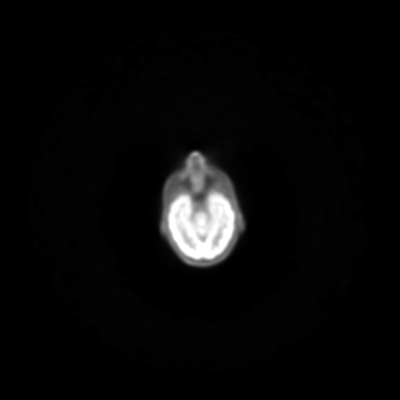
[im 57/85]
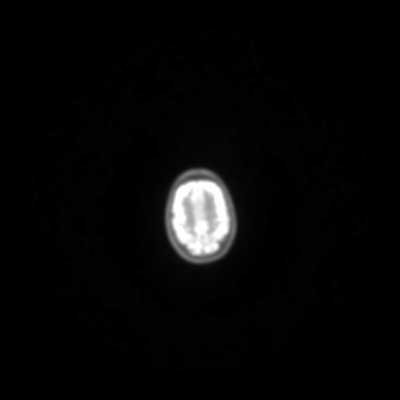
[im 71/85]
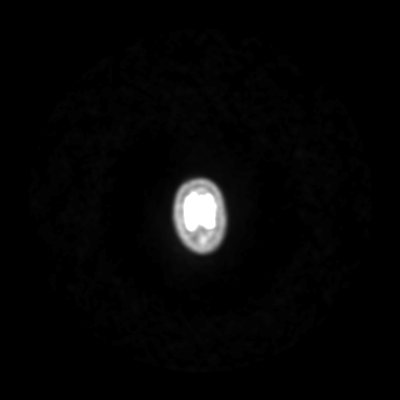
[im 85/85]
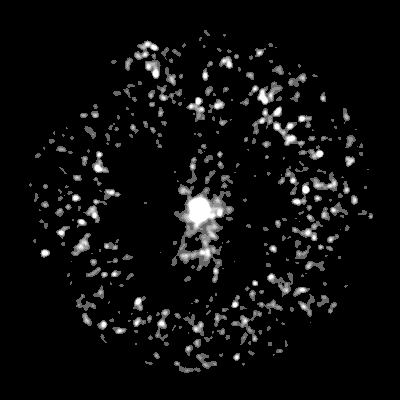

[Series 4: ct brain 3.0 h31s · axial · 3.0mm · 0.40mm/px · z∈[-525,-357]mm · 6 of 85 slices shown]
[im 1/85  brain]
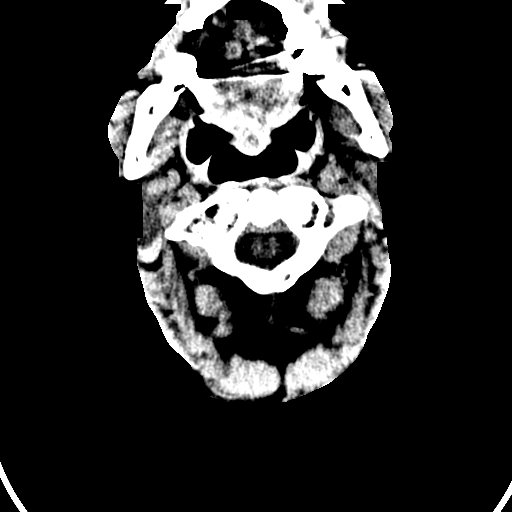
[im 15/85  brain]
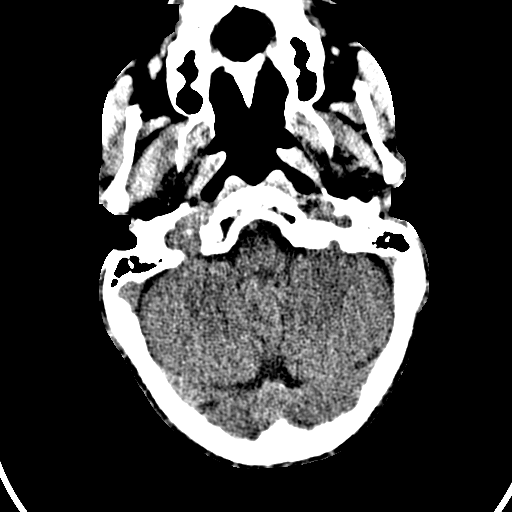
[im 43/85  brain]
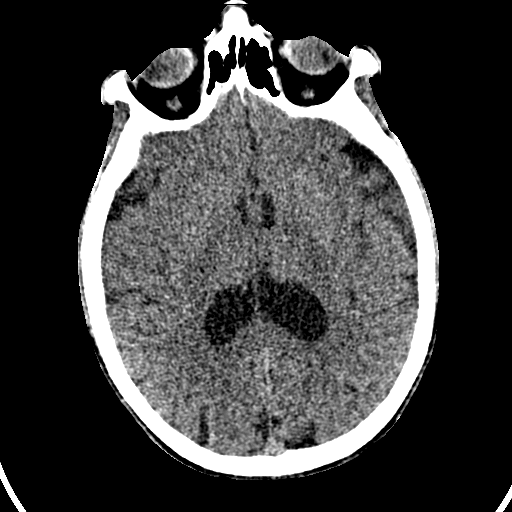
[im 57/85  brain]
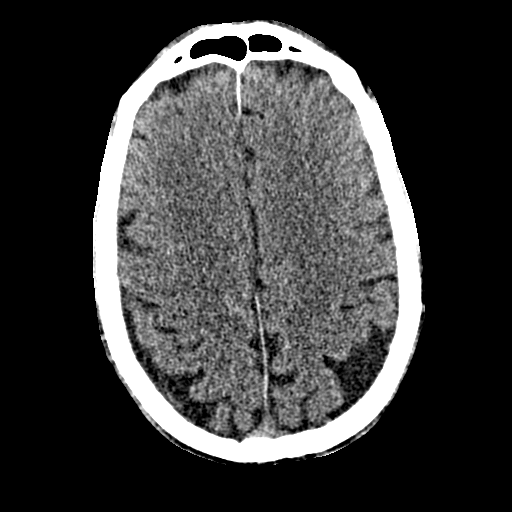
[im 71/85  bone]
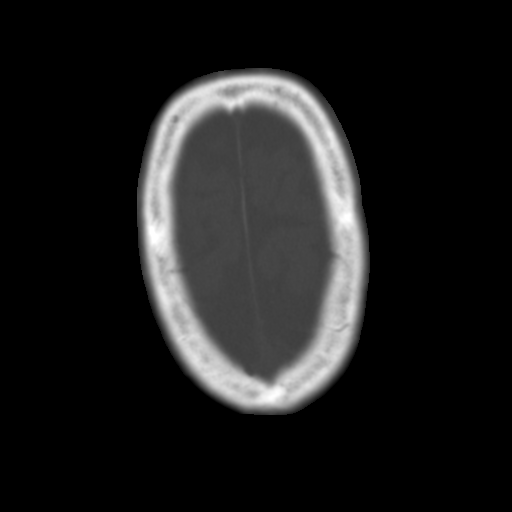
[im 85/85]
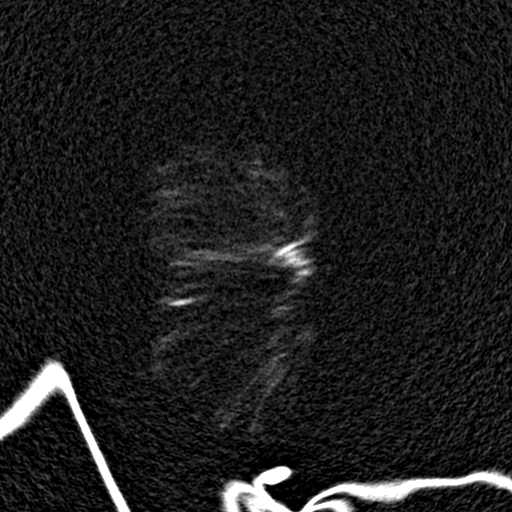

[Series 603: range-ct brain 3.0 (id)<alpha range> · 2 of 34 slices shown (1 of 2)]
[im 1/34]
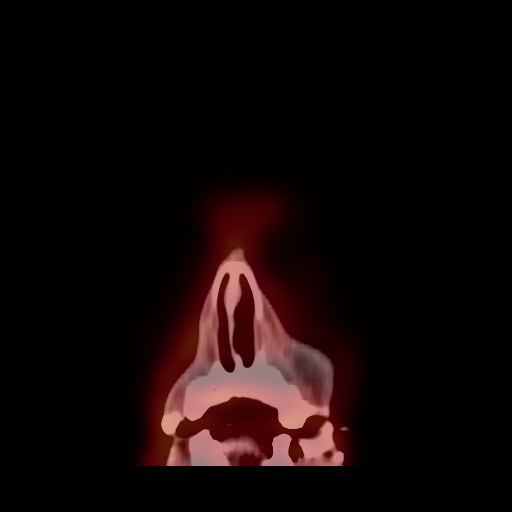
[im 34/34]
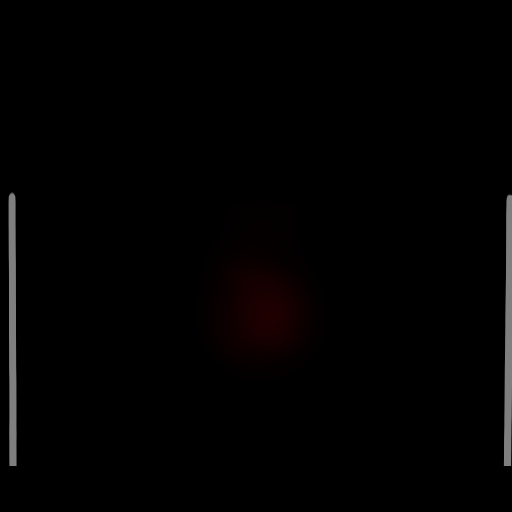

[Series 604: mip range 3 · coronal · 1.68mm/px · 3 of 32 slices shown]
[im 1/32]
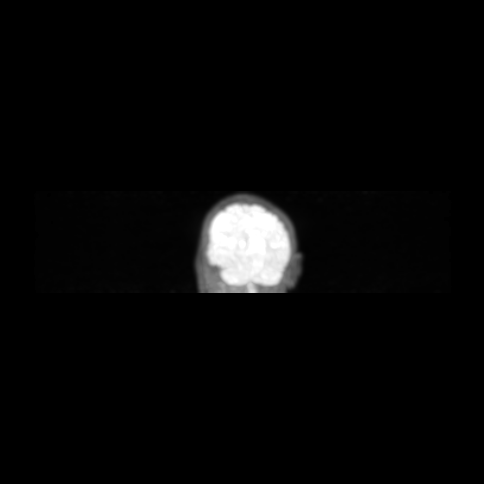
[im 16/32]
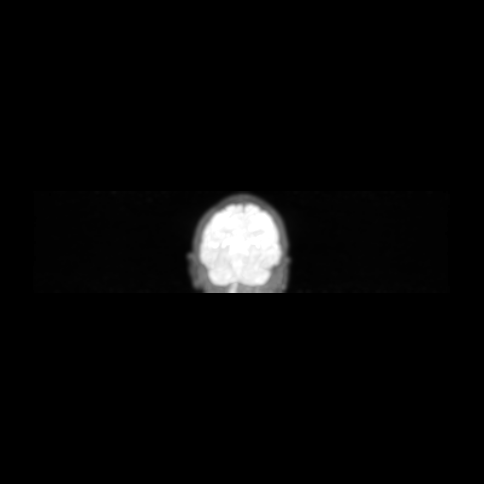
[im 32/32]
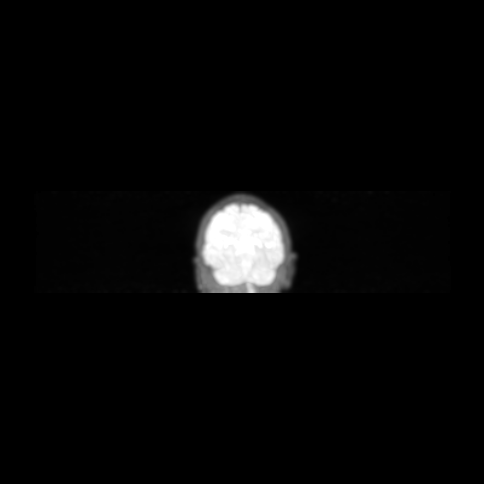

[Series 605: range-ct brain 3.0 (id)<alpha range> · 4 of 57 slices shown (2 of 2)]
[im 1/57]
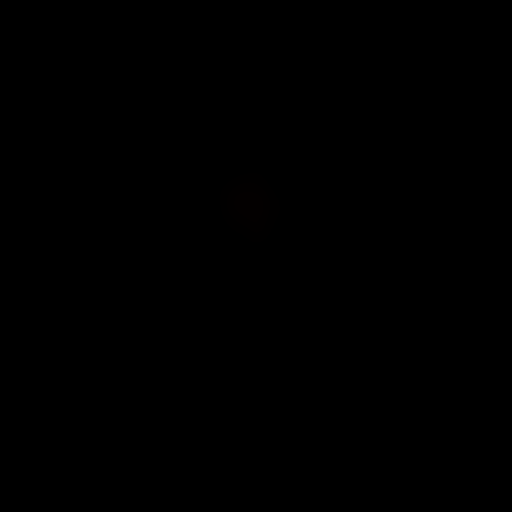
[im 29/57]
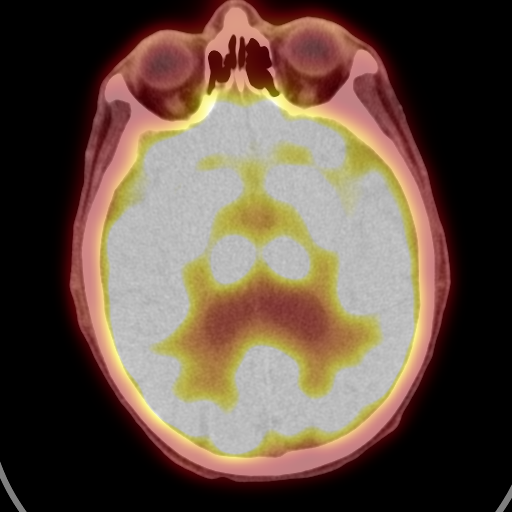
[im 43/57]
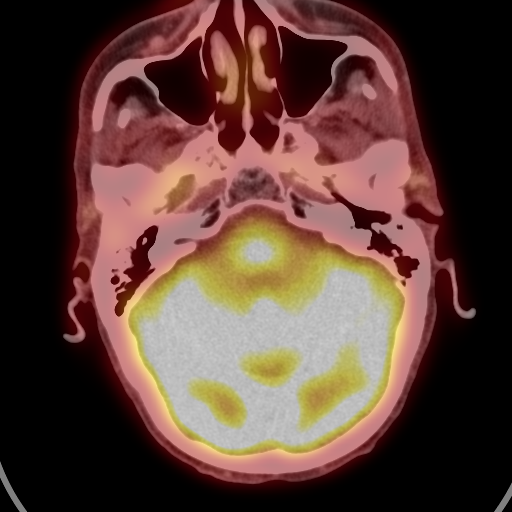
[im 57/57]
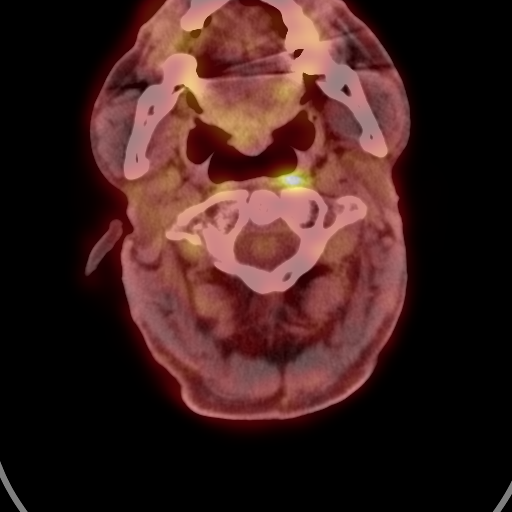

[21 of 25 positions shown; findings below may reference images not displayed]

FINDINGS: There is subtle decreased cortical metabolic activity within the
biparietal lobes relative to the frontal lobes. Additionally there
is subtle decreased cortical metabolic activity within the occipital
lobes relative to normal activity the frontal lobes. Relative normal
activity in the temporal lobes. Mild atrophy with parietal lobes
noted which can decreased specificity.
IMPRESSION: 1. Subtle decreased cortical metabolism the biparietal lobes
concerning for early Alzheimer's type pathology although the subtle
relative change is less specific. Consider follow-up examination at
some point or utilizing a more specific bio marker exam (beta
amyloid PET imaging).
2. Additional mild subtle decreased cortical metabolism within the
occipital lobes. This is atypical of Alzheimer's disease and can be
found with Violet body dementia. Again the subtle differences in
relative cortical metabolism are less specific.

## 2018-10-20 NOTE — Telephone Encounter (Signed)
This encounter was created in error - please disregard.

## 2018-10-26 DIAGNOSIS — L89152 Pressure ulcer of sacral region, stage 2: Secondary | ICD-10-CM | POA: Diagnosis not present

## 2018-10-26 DIAGNOSIS — G3183 Dementia with Lewy bodies: Secondary | ICD-10-CM | POA: Diagnosis not present

## 2018-10-26 DIAGNOSIS — I10 Essential (primary) hypertension: Secondary | ICD-10-CM | POA: Diagnosis not present

## 2018-10-26 DIAGNOSIS — T829XXD Unspecified complication of cardiac and vascular prosthetic device, implant and graft, subsequent encounter: Secondary | ICD-10-CM | POA: Diagnosis not present

## 2018-11-05 ENCOUNTER — Encounter: Payer: Medicare Other | Admitting: Cardiovascular Disease

## 2018-11-11 ENCOUNTER — Ambulatory Visit (INDEPENDENT_AMBULATORY_CARE_PROVIDER_SITE_OTHER): Payer: Medicare Other | Admitting: Cardiovascular Disease

## 2018-11-11 ENCOUNTER — Encounter: Payer: Self-pay | Admitting: Cardiovascular Disease

## 2018-11-11 VITALS — BP 110/60 | HR 60 | Ht 69.0 in | Wt 111.0 lb

## 2018-11-11 DIAGNOSIS — I495 Sick sinus syndrome: Secondary | ICD-10-CM

## 2018-11-11 DIAGNOSIS — Z950 Presence of cardiac pacemaker: Secondary | ICD-10-CM

## 2018-11-11 DIAGNOSIS — I10 Essential (primary) hypertension: Secondary | ICD-10-CM

## 2018-11-11 NOTE — Patient Instructions (Signed)
Medication Instructions:  Your physician recommends that you continue on your current medications as directed. Please refer to the Current Medication list given to you today.  If you need a refill on your cardiac medications before your next appointment, please call your pharmacy.   Lab work: NONE  Testing/Procedures: NONE  Follow-Up: At [facility], you and your health needs are our priority.  As part of our continuing mission to provide you with exceptional heart care, we have created designated Provider Care Teams.  These Care Teams include your primary Cardiologist (physician) and Advanced Practice Providers (APPs -  Physician Assistants and Nurse Practitioners) who all work together to provide you with the care you need, when you need it. You will need a follow up appointment in 12 months.  Please call our office 2 months in advance to schedule this appointment.  You may see Sanda Klein, MD or one of the following Advanced Practice Providers on your designated Care Team: [place], [place] . Fabian Sharp, PA-C  Any Other Special Instructions Will Be Listed Below (If Applicable).  [place]

## 2018-11-12 NOTE — Progress Notes (Signed)
Cardiology Office Note:    Date:  11/12/2018   ID:  [name], DOB July 07, 1941, MRN [id number]  PCP:  Charles Gravel, MD  Cardiologist:  New Electrophysiologist:  None   Referring MD: Charles Gravel, MD   Chief Complaint  Patient presents with  . sick sinus syndrome     History of Present Illness:    Charles [name] is a 78 y.o. male with a hx of rapidly progressive dementia, hypertension, history of diastolic dysfunction and aortic insufficiency, Parkinson's and sinus node dysfunction with symptomatic bradycardia.  His wife, Charles [name], is also my patient.  He underwent implantation of a dual-chamber pacemaker (St Jude Assurity) in November 0174,, complicated by lead perforation with pericarditis and pneumothorax.  However, he has recovered well from that procedure.  He seems to be more active and more interactive according to his family ever since the pacemaker was implanted.  He definitely seems more lively and speaks more today than at his last appointment.  Device function is normal.  Estimated generator longevity is 11 years.  Lead parameters are excellent.  He has 73% atrial pacing and no ventricular pacing.  He has not had any episodes of mode switch.  The surgical site is healed well.  He remains very lean.  At 111 1 pounds his BMI is only 16, consistent with severe malnutrition.  Past Medical History:  Diagnosis Date  . Acute pericarditis 08/09/2018   Admitted 11/19 for pericarditis; ? Related to recent pacer implant; small eff on echo/no tamponade  . Anxiety   . Aortic regurgitation   . Complication of anesthesia   . Depression   . Diastolic dysfunction   . Hypertension   . 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 SSS (sick sinus syndrome) ([place])     Past Surgical History:  Procedure Laterality Date  . acromioclavicular separation  07/04/2016  . CHEST TUBE INSERTION Left 08/05/2018  . EYE SURGERY    . HERNIA REPAIR    .  LEAD REVISION/REPAIR N/A 08/10/2018   Procedure: LEAD REVISION/REPAIR;  Surgeon: Deboraha Sprang, MD;  Location: [facility];  Service: Cardiovascular;  Laterality: N/A;  . NM MYOVIEW LTD    . PACEMAKER IMPLANT N/A 08/05/2018   Procedure: St Jude dual lead PACEMAKER IMPLANT;  Surgeon: Charles Klein, MD;  Location: [facility];  Service: Cardiovascular;  Laterality: N/A;  . US ECHOCARDIOGRAPHY      Current Medications: Current Meds  Medication Sig  . acetaminophen (TYLENOL) 325 MG tablet Take 650 mg by mouth every 6 (six) hours as needed for mild pain.  Marland Kitchen amLODipine (NORVASC) 2.5 MG tablet Take 2.5 mg by mouth daily.  Marland Kitchen aspirin EC 81 MG tablet Take 81 mg by mouth daily.  . carbidopa-levodopa (SINEMET IR) 25-100 MG tablet Take 1 tablet by mouth 3 (three) times daily. (0830, 1330, & 2030)  . donepezil (ARICEPT) 10 MG tablet Take 10 mg by mouth daily.   Marland Kitchen escitalopram (LEXAPRO) 10 MG tablet Take 10 mg by mouth at bedtime.   Marland Kitchen L-Methylfolate-B12-B6-B2 (CEREFOLIN) 02-28-49-5 MG TABS Take 1 tablet by mouth daily.   . Melatonin-Pyridoxine (MELATIN PO) Take 3 mg by mouth at bedtime.  . Multiple Vitamin (MULTIVITAMIN WITH MINERALS) TABS tablet Take 1 tablet by mouth daily.  . [DISCONTINUED] quinapril (ACCUPRIL) 40 MG tablet Take 40 mg by mouth 2 (two) times daily.     Allergies:   Propofol   Social History  Socioeconomic History  . Marital status: Married    Spouse name: Not on file  . Number of children: 3  . Years of education: Not on file  . Highest education level: Associate degree: academic program  Occupational History  . Not on file  Social Needs  . Financial resource strain: Not on file  . Food insecurity:    Worry: Not on file    Inability: Not on file  . Transportation needs:    Medical: Not on file    Non-medical: Not on file  Tobacco Use  . Smoking status: Never Smoker  . Smokeless tobacco: Never Used  Substance and Sexual Activity  . Alcohol use: No  .  Drug use: No  . Sexual activity: Not on file  Lifestyle  . Physical activity:    Days per week: Not on file    Minutes per session: Not on file  . Stress: Not on file  Relationships  . Social connections:    Talks on phone: Not on file    Gets together: Not on file    Attends religious service: Not on file    Active member of club or organization: Not on file    Attends meetings of clubs or organizations: Not on file    Relationship status: Not on file  Other Topics Concern  . Not on file  Social History Narrative   Lives at home with his wife   Right handed   No caffeine      Family History: The patient's family history includes Angina in his father; Asthma in his maternal grandmother; Diabetes in his brother, mother, and paternal grandfather; Heart Problems in his brother; Heart attack in his brother and father; Heart disease in his brother; Hypertension in his mother; Lung cancer in his mother; Stroke in his maternal grandfather; Thyroid disease in his mother. There is no history of Dementia.  ROS:   Please see the history of present illness.     All other systems reviewed and are negative.  EKGs/Labs/Other Studies Reviewed:    The following studies were reviewed today:   EKG:  EKG is not ordered today.  Intracardiac electrogram shows atrial paced, ventricular sensed rhythm  Recent Labs: 03/26/2018: Magnesium 2.2 08/09/2018: ALT 21; BUN 21; Creatinine, Ser 1.07; Hemoglobin 12.6; Platelets 178; Potassium 4.0; Sodium 136; TSH 3.334  Recent Lipid Panel No results found for: CHOL, TRIG, HDL, CHOLHDL, VLDL, LDLCALC, LDLDIRECT  Physical Exam:    VS:  BP 110/60   Pulse 60   Ht 5\' 9"  (1.753 m)   Wt 111 lb (50.3 kg)   SpO2 96%   BMI 16.39 kg/m   116 lb RR 15  Wt Readings from Last 3 Encounters:  11/11/18 111 lb (50.3 kg)  08/11/18 111 lb 12.4 oz (50.7 kg)  08/07/18 110 lb 7.2 oz (50.1 kg)      General: Alert, oriented to location, self and circumstances, not to  the year, no distress, extremely slender.  Well-healed left subclavian pacemaker site Head: no evidence of trauma, PERRL, EOMI, no exophtalmos or lid lag, no myxedema, no xanthelasma; normal ears, nose and oropharynx Neck: normal jugular venous pulsations and no hepatojugular reflux; brisk carotid pulses without delay and no carotid bruits Chest: clear to auscultation, no signs of consolidation by percussion or palpation, normal fremitus, symmetrical and full respiratory excursions Cardiovascular: normal position and quality of the apical impulse, regular rhythm, normal first and second heart sounds, no murmurs, rubs or gallops Abdomen: no tenderness  or distention, no masses by palpation, no abnormal pulsatility or arterial bruits, normal bowel sounds, no hepatosplenomegaly Extremities: no clubbing, cyanosis or edema; 2+ radial, ulnar and brachial pulses bilaterally; 2+ right femoral, posterior tibial and dorsalis pedis pulses; 2+ left femoral, posterior tibial and dorsalis pedis pulses; no subclavian or femoral bruits Neurological: grossly nonfocal Psych: Normal mood and affect   ASSESSMENT:    1. SSS (sick sinus syndrome) ([place])   2. Pacemaker   3. Essential hypertension    PLAN:    In order of problems listed above:  1. Sinus bradycardia: Pacemaker implantation seems to have led to improvement in his overall wellbeing. 2. PPM: Normal device function.  Plan remote downloads every 3 months and yearly office visits. 3. HTN: His blood pressure is in the low normal range and he continues to take amlodipine.  Low threshold to discontinue it if he develops symptoms of hypotension.   Medication Adjustments/Labs and Tests Ordered: Current medicines are reviewed at length with the patient today.  Concerns regarding medicines are outlined above.  No orders of the defined types were placed in this encounter.  No orders of the defined types were placed in this encounter.   Patient Instructions    Medication Instructions:  Your physician recommends that you continue on your current medications as directed. Please refer to the Current Medication list given to you today.  If you need a refill on your cardiac medications before your next appointment, please call your pharmacy.   Lab work: NONE  Testing/Procedures: NONE  Follow-Up: At [facility], you and your health needs are our priority.  As part of our continuing mission to provide you with exceptional heart care, we have created designated Provider Care Teams.  These Care Teams include your primary Cardiologist (physician) and Advanced Practice Providers (APPs -  Physician Assistants and Nurse Practitioners) who all work together to provide you with the care you need, when you need it. You will need a follow up appointment in 12 months.  Please call our office 2 months in advance to schedule this appointment.  You may see Charles Klein, MD or one of the following Advanced Practice Providers on your designated Care Team: [place], [place] . Fabian Sharp, PA-C  Any Other Special Instructions Will Be Listed Below (If Applicable).  [facility] PACER CHECKS      Signed, Charles Klein, MD  11/12/2018 2:26 PM    [place]

## 2018-11-17 LAB — CUP PACEART INCLINIC DEVICE CHECK
Date Time Interrogation Session: 20200218153558
Implantable Lead Implant Date: 20191106
MDC IDC LEAD IMPLANT DT: 20191111
MDC IDC LEAD LOCATION: 753859
MDC IDC LEAD LOCATION: 753860
MDC IDC PG IMPLANT DT: 20191106
Pulse Gen Model: 2272
Pulse Gen Serial Number: 9083517

## 2018-12-02 DIAGNOSIS — F0281 Dementia in other diseases classified elsewhere with behavioral disturbance: Secondary | ICD-10-CM | POA: Diagnosis not present

## 2018-12-02 DIAGNOSIS — G20 Parkinson's disease: Secondary | ICD-10-CM | POA: Diagnosis not present

## 2018-12-02 DIAGNOSIS — Z79899 Other long term (current) drug therapy: Secondary | ICD-10-CM | POA: Diagnosis not present

## 2018-12-02 DIAGNOSIS — R4189 Other symptoms and signs involving cognitive functions and awareness: Secondary | ICD-10-CM | POA: Diagnosis not present

## 2018-12-02 DIAGNOSIS — G3183 Dementia with Lewy bodies: Secondary | ICD-10-CM | POA: Diagnosis not present

## 2018-12-24 ENCOUNTER — Ambulatory Visit: Payer: Medicare Other | Admitting: Internal Medicine

## 2019-02-01 ENCOUNTER — Ambulatory Visit: Payer: Medicare Other | Admitting: Internal Medicine

## 2019-02-09 ENCOUNTER — Ambulatory Visit (INDEPENDENT_AMBULATORY_CARE_PROVIDER_SITE_OTHER): Payer: Medicare Other | Admitting: *Deleted

## 2019-02-09 ENCOUNTER — Other Ambulatory Visit: Payer: Self-pay

## 2019-02-09 DIAGNOSIS — I495 Sick sinus syndrome: Secondary | ICD-10-CM

## 2019-02-10 LAB — CUP PACEART REMOTE DEVICE CHECK
Date Time Interrogation Session: 20200513105812
Implantable Lead Implant Date: 20191106
Implantable Lead Implant Date: 20191111
Implantable Lead Location: 753859
Implantable Lead Location: 753860
Implantable Lead Model: 1948
Implantable Pulse Generator Implant Date: 20191106
Pulse Gen Model: 2272
Pulse Gen Serial Number: 9083517

## 2019-02-18 ENCOUNTER — Other Ambulatory Visit: Payer: Self-pay

## 2019-02-18 ENCOUNTER — Ambulatory Visit: Payer: Medicare Other | Admitting: Internal Medicine

## 2019-02-18 ENCOUNTER — Ambulatory Visit (INDEPENDENT_AMBULATORY_CARE_PROVIDER_SITE_OTHER): Payer: Medicare Other | Admitting: Internal Medicine

## 2019-02-18 ENCOUNTER — Encounter: Payer: Self-pay | Admitting: Internal Medicine

## 2019-02-18 VITALS — BP 120/60 | HR 60 | Temp 97.5°F | Ht 69.0 in | Wt 110.0 lb

## 2019-02-18 DIAGNOSIS — I495 Sick sinus syndrome: Secondary | ICD-10-CM

## 2019-02-18 DIAGNOSIS — F0280 Dementia in other diseases classified elsewhere without behavioral disturbance: Secondary | ICD-10-CM | POA: Diagnosis not present

## 2019-02-18 DIAGNOSIS — R634 Abnormal weight loss: Secondary | ICD-10-CM | POA: Diagnosis not present

## 2019-02-18 DIAGNOSIS — R351 Nocturia: Secondary | ICD-10-CM

## 2019-02-18 DIAGNOSIS — R636 Underweight: Secondary | ICD-10-CM

## 2019-02-18 DIAGNOSIS — Z950 Presence of cardiac pacemaker: Secondary | ICD-10-CM | POA: Diagnosis not present

## 2019-02-18 DIAGNOSIS — F324 Major depressive disorder, single episode, in partial remission: Secondary | ICD-10-CM

## 2019-02-18 DIAGNOSIS — I5032 Chronic diastolic (congestive) heart failure: Secondary | ICD-10-CM | POA: Diagnosis not present

## 2019-02-18 DIAGNOSIS — G3183 Dementia with Lewy bodies: Secondary | ICD-10-CM | POA: Diagnosis not present

## 2019-02-18 DIAGNOSIS — Z681 Body mass index (BMI) 19 or less, adult: Secondary | ICD-10-CM | POA: Diagnosis not present

## 2019-02-18 NOTE — Progress Notes (Signed)
Provider:  Rexene Edison. Mariea Clonts, D.O., C.M.D. Location:   [facility]   Place of Service:   clinic  Previous PCP: Jani Gravel, MD Patient Care Team: Jani Gravel, MD as PCP - General (Internal Medicine) Croitoru, Dani Gobble, MD as PCP - Cardiology (Cardiology)  Extended Emergency Contact Information Primary Emergency Contact: Olesen,[name] H Address: [place], [place] [address] [address] [place] Phone: [phone] Relation: Spouse Secondary Emergency Contact: Layne Benton Mobile Phone: [phone] Relation: Daughter  Goals of Care: Advanced Directive information Advanced Directives 08/09/2018  Does Patient Have a Medical Advance Directive? Yes  Type of Advance Directive [place]  Does patient want to make changes to medical advance directive? No - Patient declined  Copy of [place] in Chart? No - copy requested      Chief Complaint  Patient presents with  . Establish Care    new patient    HPI: Patient is a 78 y.o. male seen today to establish with [facility].  Records have been requested from prior PCP, Dr. Jani Gravel, but have not yet been received.  I'm able to see notes form [facility] in [place] and from Dr. Jaynee Eagles.    He was put on sinemet in March with [facility].  When he tries to stand to get his balance, he has to really steady himself.  He denies dizziness on standing.  He pays a lot of attention to keep from falling.  He does have much tremor.    His wife notes that prior to 2019, he was very active, rode his bike, walked, exercised.  Late last year, he started to lose his voice.  The children said, what's wrong with him.  They were seeing Dr. Maudie Mercury.  Then they went to Dr. Jaynee Eagles.  Then they went to [facility] and saw Dr. Rondel Oh.  There's been a thought of stopping his bp medication.  He's lost a lot of energy.  He used to ride 6-8 miles 2-3 times a week in 2018.  He's lost 45 lbs since 11/18.   He can't gain with three meals a day, boost, protein.  He's even eating red meat he never ate before.    He had a pacemaker placed for sick sinus.  It was complicated by a lead that caused him to develop pericarditis and a pneumothorax.  His wife mentions the cognitive part.  He admits some short-term memory loss.  It may be delayed.  He would go out to the car to go to work and just sit there.  After that, he couldn't remember to put on the coffee which was his routine task.  He has to really think to figure things out.  It's like he's stuck and can't get to the next step.  Has a lot of trouble deciding what he wants to eat.  Dr. Jaynee Eagles said he should not drive (that was over a year ago).  He had lots of tests thru Dr. Jaynee Eagles.  He had brain scans.  He puts out his meds and takes them himself.  He does have dreams--they feel real.    He's on escitalopram for depression since 2019 with Dr. Maudie Mercury.  He does not feel sad.  He cannot describe how he feels.  He was having anxiety then.  It has helped him.    He rarely uses tylenol.  He has no pain.  He does get up  3-4 times at night to urinate.  He goes several times in the day.  No difficulty with stream.  He does hydrate well with water.    He sleeps some.  He takes melatonin which has helped some.  His urinary frequency interrupts sleep.  He used to be on hctz which really made him go.    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  He admits he even struggles some with dates/numbers like he used to.  His wife says he still remembers quite a bit about numbers.     Past Medical History:  Diagnosis Date  . Acute pericarditis 08/09/2018   Admitted 11/19 for pericarditis; ? Related to recent pacer implant; small eff on echo/no tamponade  . Anxiety   . Aortic regurgitation   . Complication of anesthesia   . Depression   . Diastolic dysfunction   . Hypertension   . 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 SSS (sick sinus syndrome) ([place])    Past Surgical History:  Procedure Laterality Date  . acromioclavicular separation  07/04/2016  . CHEST TUBE INSERTION Left 08/05/2018  . EYE SURGERY    . HERNIA REPAIR    . LEAD REVISION/REPAIR N/A 08/10/2018   Procedure: LEAD REVISION/REPAIR;  Surgeon: Deboraha Sprang, MD;  Location: [facility];  Service: Cardiovascular;  Laterality: N/A;  . NM MYOVIEW LTD    . PACEMAKER IMPLANT N/A 08/05/2018   Procedure: St Jude dual lead PACEMAKER IMPLANT;  Surgeon: Sanda Klein, MD;  Location: [facility];  Service: Cardiovascular;  Laterality: N/A;  . US ECHOCARDIOGRAPHY      Social History   Socioeconomic History  . Marital status: Married    Spouse name: Not on file  . Number of children: 3  . Years of education: Not on file  . Highest education level: Associate degree: academic program  Occupational History  . Not on file  Social Needs  . Financial resource strain: Not on file  . Food insecurity:    Worry: Not on file    Inability: Not on file  . Transportation needs:    Medical: Not on file    Non-medical: Not on file  Tobacco Use  . Smoking status: Never Smoker  . Smokeless tobacco: Never Used  Substance and Sexual Activity  . Alcohol use: No  . Drug use: No  . Sexual activity: Not on file  Lifestyle  . Physical activity:    Days per week: Not on file    Minutes per session: Not on file  . Stress: Not on file  Relationships  . Social connections:    Talks on phone: Not on file    Gets together: Not on file    Attends religious service: Not on file    Active member of club or organization: Not on file    Attends meetings of clubs or organizations: Not on file    Relationship status: Not on file  Other Topics Concern  . Not on file  Social History Narrative   Lives at home with his wife   Right handed   No caffeine     reports that he  has never smoked. He has never used smokeless tobacco. He reports that he does not drink alcohol or use drugs.  Functional Status Survey:    Family History  Problem  Relation Age of Onset  . Hypertension Mother   . Lung cancer Mother   . Diabetes Mother   . Thyroid disease Mother   . Angina Father   . Heart attack Father   . Heart disease Brother   . Diabetes Brother   . Heart attack Brother   . Asthma Maternal Grandmother   . Stroke Maternal Grandfather   . Diabetes Paternal Grandfather   . Heart Problems Brother   . Dementia Neg Hx     Health Maintenance  Topic Date Due  . TETANUS/TDAP  12/02/1960  . PNA vac Low Risk Adult (1 of 2 - PCV13) 12/03/2006  . INFLUENZA VACCINE  05/01/2019    Allergies  Allergen Reactions  . Propofol Anaphylaxis    Outpatient Encounter Medications as of 02/18/2019  Medication Sig  . acetaminophen (TYLENOL) 325 MG tablet Take 650 mg by mouth every 6 (six) hours as needed for mild pain.  Marland Kitchen amLODipine (NORVASC) 2.5 MG tablet Take 2.5 mg by mouth daily.  Marland Kitchen aspirin EC 81 MG tablet Take 81 mg by mouth daily.  . carbidopa-levodopa (SINEMET IR) 25-100 MG tablet Take 1 tablet by mouth 3 (three) times daily. (0830, 1330, & 2030)  . donepezil (ARICEPT) 10 MG tablet Take 10 mg by mouth daily.   Marland Kitchen escitalopram (LEXAPRO) 10 MG tablet Take 10 mg by mouth at bedtime.   Marland Kitchen L-Methylfolate-B12-B6-B2 (CEREFOLIN) 02-28-49-5 MG TABS Take 1 tablet by mouth daily.   . Melatonin-Pyridoxine (MELATIN PO) Take 3 mg by mouth at bedtime.  . Multiple Vitamin (MULTIVITAMIN WITH MINERALS) TABS tablet Take 1 tablet by mouth daily.   No facility-administered encounter medications on file as of 02/18/2019.     Review of Systems  Constitutional: Positive for malaise/fatigue and weight loss. Negative for chills, diaphoresis and fever.  HENT: Negative for hearing loss.        Cerumen in both ears  Eyes: Negative for blurred vision.  Respiratory: Negative for cough and shortness  of breath.   Cardiovascular: Negative for chest pain, palpitations and leg swelling.  Gastrointestinal: Negative for abdominal pain, blood in stool, constipation, diarrhea and melena.  Genitourinary: Positive for frequency and urgency. Negative for dysuria.       Nocturia  Musculoskeletal: Negative for back pain, falls and joint pain.  Skin: Negative for itching and rash.  Neurological: Negative for dizziness and loss of consciousness.       Had been having dizziness, but not recently  Endo/Heme/Allergies: Does not bruise/bleed easily.  Psychiatric/Behavioral: Positive for memory loss. Negative for depression. The patient is nervous/anxious and has insomnia.     Vitals:   02/18/19 1121  BP: 120/60  Pulse: 60  Temp: (!) 97.5 F (36.4 C)  TempSrc: Oral  SpO2: 95%  Weight: 110 lb (49.9 kg)  Height: 5\' 9"  (1.753 m)   Body mass index is 16.24 kg/m. Physical Exam Vitals signs reviewed.  Constitutional:      General: He is not in acute distress.    Appearance: Normal appearance. He is not toxic-appearing.     Comments: Thin white male, tall  HENT:     Head: Normocephalic and atraumatic.     Right Ear: External ear normal. There is impacted cerumen.     Left Ear: External ear normal. There is impacted cerumen.     Nose:     Comments: Deferred due to covid pandemic masking    Mouth/Throat:     Comments: Deferred due to covid pandemic masking Eyes:  Extraocular Movements: Extraocular movements intact.     Conjunctiva/sclera: Conjunctivae normal.     Pupils: Pupils are equal, round, and reactive to light.  Neck:     Musculoskeletal: Normal range of motion.  Cardiovascular:     Rate and Rhythm: Normal rate and regular rhythm.     Pulses: Normal pulses.     Heart sounds: Normal heart sounds.  Pulmonary:     Effort: Pulmonary effort is normal.     Breath sounds: Normal breath sounds.  Abdominal:     General: Abdomen is flat. Bowel sounds are normal. There is no distension.       Palpations: Abdomen is soft.     Tenderness: There is no abdominal tenderness. There is no guarding or rebound.  Musculoskeletal: Normal range of motion.        General: No tenderness.     Right lower leg: No edema.     Left lower leg: No edema.  Lymphadenopathy:     Cervical: No cervical adenopathy.  Skin:    General: Skin is warm and dry.     Capillary Refill: Capillary refill takes less than 2 seconds.  Neurological:     General: No focal deficit present.     Mental Status: He is alert and oriented to person, place, and time. Mental status is at baseline.     Cranial Nerves: No cranial nerve deficit.     Motor: No weakness.     Gait: Gait abnormal.     Comments: Difficulty putting together thoughts to answer questions or share information with me  Psychiatric:        Mood and Affect: Mood normal.     Labs reviewed: Basic Metabolic Panel: Recent Labs    03/26/18 1337 08/03/18 1135 08/09/18 1311 08/09/18 1844  NA 138 141 136 136  K 4.6 5.1 4.1 4.0  CL 96 97 97* 99  CO2 30* 27  --  27  GLUCOSE 91 85 116* 192*  BUN 13 18 27* 21  CREATININE 1.10 1.08 1.10 1.07  CALCIUM 9.8 9.8  --  9.0  MG 2.2  --   --   --   PHOS 3.5  --   --   --    Liver Function Tests: Recent Labs    03/26/18 1337 03/27/18 1013 08/09/18 1844  AST 43*  --  46*  ALT 54*  --  21  ALKPHOS 71  --  54  BILITOT 1.3*  --  1.1  PROT 6.3  --  5.7*  ALBUMIN 4.2 4.3 3.1*   No results for input(s): LIPASE, AMYLASE in the last 8760 hours. No results for input(s): AMMONIA in the last 8760 hours. CBC: Recent Labs    03/26/18 1337 08/03/18 1135 08/09/18 1258 08/09/18 1311  WBC 6.2 7.2 9.5  --   NEUTROABS  --   --  6.9  --   HGB 13.0 13.1 11.9* 12.6*  HCT 39.7 38.3 38.0* 37.0*  MCV 95 94 99.2  --   PLT 190 204 178  --    Cardiac Enzymes: Recent Labs    08/09/18 1844 08/09/18 2213 08/10/18 0705  TROPONINI <0.03 <0.03 <0.03   BNP: Invalid input(s): POCBNP No results found for:  HGBA1C Lab Results  Component Value Date   TSH 3.334 08/09/2018   Lab Results  Component Value Date   HGDJMEQA83 419 10/14/2017   Lab Results  Component Value Date   FOLATE >20.0 10/14/2017   No results found for: IRON,  TIBC, FERRITIN  Imaging and Procedures noted on new patient packet: Reviewed prior CT, PET, MRI, notes from Dr. Rondel Oh, Dr. Sallyanne Kuster  Assessment/Plan 1. 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 Rondel Oh  2. Underweight - appetite may be being affected by the aricept also - weight continuing to drop despite his wife's efforts with cooking and initiating times to eat, offering supplement shakes -one thing I've not seen tested is PSA and he has significant nocturia and frequency per his wife  3. Body mass index (BMI) of 19 or less in adult - 16  -again counseled that he will need prompting to eat possibly at this point as he is unmotivated - cont supplement shakes, also and foods he enjoys being less concerned about low calorie items - PSA; Future  4. Nocturia - does hydrate well, but sounds like nocturia has worsened past couple of years and seems abnormal -given weight loss and change in symptoms, check psa - PSA; Future  5. Weight loss, unintentional - as above - TSH; Future - PSA; Future  6. Depression, major, single episode, in partial remission ([place]) -continues on lexapro therapy  7. Chronic diastolic CHF (congestive heart failure) (HCC) - followed by Dr. Loletha Grayer -does not seem like this is causing his malaise - CBC with  Differential/Platelet; Future - COMPLETE METABOLIC PANEL WITH GFR; Future - Lipid panel; Future  8. Pacemaker -for #9; in place, last saw Dr. Loletha Grayer in Feb -had difficult with pericarditis and pneumothorax from lead  9. Sick sinus syndrome (HCC) -being managed with pacemaker, continue same  Labs/tests ordered:   Orders Placed This Encounter  Procedures  . CBC with Differential/Platelet    Standing Status:   Future    Standing Expiration Date:   02/18/2020  . COMPLETE METABOLIC PANEL WITH GFR    Standing Status:   Future    Standing Expiration Date:   02/18/2020  . Lipid panel    Standing Status:   Future    Standing Expiration Date:   02/18/2020  . TSH    Standing Status:   Future    Standing Expiration Date:   02/18/2020  . PSA    Standing Status:   Future    Standing Expiration Date:   02/18/2020   05/20/2019 f/u visit but labs next week  Blaise Grieshaber L. Kealohilani Maiorino, D.O. [facility] [address], [place] [address] Cell Phone (Mon-Fri 8am-5pm):  [phone] On Call:  [phone] & follow prompts after 5pm & weekends Office Phone:  [phone] Office Fax:  [phone]

## 2019-02-24 ENCOUNTER — Other Ambulatory Visit: Payer: Medicare Other

## 2019-02-24 ENCOUNTER — Other Ambulatory Visit: Payer: Self-pay

## 2019-02-24 DIAGNOSIS — R636 Underweight: Secondary | ICD-10-CM | POA: Diagnosis not present

## 2019-02-24 DIAGNOSIS — R634 Abnormal weight loss: Secondary | ICD-10-CM | POA: Diagnosis not present

## 2019-02-24 DIAGNOSIS — I5032 Chronic diastolic (congestive) heart failure: Secondary | ICD-10-CM

## 2019-02-24 DIAGNOSIS — R351 Nocturia: Secondary | ICD-10-CM | POA: Diagnosis not present

## 2019-02-24 DIAGNOSIS — Z681 Body mass index (BMI) 19 or less, adult: Secondary | ICD-10-CM | POA: Diagnosis not present

## 2019-02-25 LAB — COMPLETE METABOLIC PANEL WITH GFR
AG Ratio: 1.7 (calc) (ref 1.0–2.5)
ALT: 11 U/L (ref 9–46)
AST: 25 U/L (ref 10–35)
Albumin: 4.6 g/dL (ref 3.6–5.1)
Alkaline phosphatase (APISO): 76 U/L (ref 35–144)
BUN: 15 mg/dL (ref 7–25)
CO2: 32 mmol/L (ref 20–32)
Calcium: 10.3 mg/dL (ref 8.6–10.3)
Chloride: 99 mmol/L (ref 98–110)
Creat: 1.14 mg/dL (ref 0.70–1.18)
GFR, Est African American: 71 mL/min/{1.73_m2} (ref >=60)
GFR, Est Non African American: 62 mL/min/{1.73_m2} (ref >=60)
Globulin: 2.7 g/dL (calc) (ref 1.9–3.7)
Glucose, Bld: 90 mg/dL (ref 65–99)
Potassium: 4.3 mmol/L (ref 3.5–5.3)
Sodium: 142 mmol/L (ref 135–146)
Total Bilirubin: 1.5 mg/dL — ABNORMAL HIGH (ref 0.2–1.2)
Total Protein: 7.3 g/dL (ref 6.1–8.1)

## 2019-02-25 LAB — LIPID PANEL
Cholesterol: 222 mg/dL — ABNORMAL HIGH (ref <200)
HDL: 118 mg/dL (ref >=40)
LDL Cholesterol (Calc): 86 mg/dL (calc)
Non-HDL Cholesterol (Calc): 104 mg/dL (calc) (ref <130)
Total CHOL/HDL Ratio: 1.9 (calc) (ref <5.0)
Triglycerides: 88 mg/dL (ref <150)

## 2019-02-25 LAB — CBC WITH DIFFERENTIAL/PLATELET
Absolute Monocytes: 693 cells/uL (ref 200–950)
Basophils Absolute: 91 cells/uL (ref 0–200)
Basophils Relative: 1.3 %
Eosinophils Absolute: 238 cells/uL (ref 15–500)
Eosinophils Relative: 3.4 %
HCT: 41.1 % (ref 38.5–50.0)
Hemoglobin: 13.9 g/dL (ref 13.2–17.1)
Lymphs Abs: 2618 cells/uL (ref 850–3900)
MCH: 32.1 pg (ref 27.0–33.0)
MCHC: 33.8 g/dL (ref 32.0–36.0)
MCV: 94.9 fL (ref 80.0–100.0)
MPV: 11 fL (ref 7.5–12.5)
Monocytes Relative: 9.9 %
Neutro Abs: 3360 cells/uL (ref 1500–7800)
Neutrophils Relative %: 48 %
Platelets: 217 10*3/uL (ref 140–400)
RBC: 4.33 10*6/uL (ref 4.20–5.80)
RDW: 13.1 % (ref 11.0–15.0)
Total Lymphocyte: 37.4 %
WBC: 7 10*3/uL (ref 3.8–10.8)

## 2019-02-25 LAB — PSA: PSA: 0.9 ng/mL (ref <=4.0)

## 2019-02-25 LAB — TSH: TSH: 3.58 mIU/L (ref 0.40–4.50)

## 2019-02-26 NOTE — Progress Notes (Signed)
Remote pacemaker transmission.   

## 2019-03-01 ENCOUNTER — Encounter: Payer: Self-pay | Admitting: *Deleted

## 2019-03-11 ENCOUNTER — Telehealth: Payer: Self-pay | Admitting: Internal Medicine

## 2019-03-11 NOTE — Telephone Encounter (Signed)
Spoke with pt's wife and gave her recommendations for low cholesterol diet, pt's wife was also wondering if you would except her as a new patient?

## 2019-03-11 NOTE — Telephone Encounter (Signed)
Wife scheduled.

## 2019-03-11 NOTE — Telephone Encounter (Signed)
Yes, I'm happy to see her, as well.

## 2019-03-11 NOTE — Telephone Encounter (Signed)
Patient's wife Tana Conch would like to discuss the results of Mr. Fudala labs.  She is concerned about the cholesterol results.

## 2019-03-23 ENCOUNTER — Other Ambulatory Visit: Payer: Self-pay | Admitting: *Deleted

## 2019-03-23 MED ORDER — ESCITALOPRAM OXALATE 10 MG PO TABS: 10.0000 mg | ORAL_TABLET | Freq: Every day | ORAL | 3 refills | Status: DC

## 2019-03-23 NOTE — Telephone Encounter (Signed)
[facility] [place]

## 2019-03-26 ENCOUNTER — Other Ambulatory Visit: Payer: Self-pay | Admitting: *Deleted

## 2019-03-26 MED ORDER — CEREFOLIN 6-1-50-5 MG PO TABS: 1.0000 | ORAL_TABLET | Freq: Every day | ORAL | 1 refills | Status: DC

## 2019-03-26 NOTE — Telephone Encounter (Signed)
[facility] requested refill Merry Proud [phone]

## 2019-05-11 ENCOUNTER — Ambulatory Visit (INDEPENDENT_AMBULATORY_CARE_PROVIDER_SITE_OTHER): Payer: Medicare Other | Admitting: *Deleted

## 2019-05-11 DIAGNOSIS — I495 Sick sinus syndrome: Secondary | ICD-10-CM

## 2019-05-11 LAB — CUP PACEART REMOTE DEVICE CHECK
Battery Remaining Longevity: 125 mo
Battery Remaining Percentage: 95.5 %
Battery Voltage: 3.01 V
Brady Statistic AP VP Percent: 1 %
Brady Statistic AP VS Percent: 81 %
Brady Statistic AS VP Percent: 1 %
Brady Statistic AS VS Percent: 19 %
Brady Statistic RA Percent Paced: 80 %
Brady Statistic RV Percent Paced: 1 %
Date Time Interrogation Session: 20200811060017
Implantable Lead Implant Date: 20191106
Implantable Lead Implant Date: 20191111
Implantable Lead Location: 753859
Implantable Lead Location: 753860
Implantable Lead Model: 1948
Implantable Pulse Generator Implant Date: 20191106
Lead Channel Impedance Value: 400 Ohm
Lead Channel Impedance Value: 760 Ohm
Lead Channel Pacing Threshold Amplitude: 0.375 V
Lead Channel Pacing Threshold Amplitude: 0.5 V
Lead Channel Pacing Threshold Pulse Width: 0.5 ms
Lead Channel Pacing Threshold Pulse Width: 0.5 ms
Lead Channel Sensing Intrinsic Amplitude: 1.1 mV
Lead Channel Sensing Intrinsic Amplitude: 5.2 mV
Lead Channel Setting Pacing Amplitude: 0.625
Lead Channel Setting Pacing Amplitude: 1.5 V
Lead Channel Setting Pacing Pulse Width: 0.5 ms
Lead Channel Setting Sensing Sensitivity: 0.5 mV
Pulse Gen Model: 2272
Pulse Gen Serial Number: 9083517

## 2019-05-19 ENCOUNTER — Encounter: Payer: Self-pay | Admitting: Cardiology

## 2019-05-19 NOTE — Progress Notes (Signed)
Remote pacemaker transmission.   

## 2019-05-20 ENCOUNTER — Ambulatory Visit (INDEPENDENT_AMBULATORY_CARE_PROVIDER_SITE_OTHER): Payer: Medicare Other | Admitting: Internal Medicine

## 2019-05-20 ENCOUNTER — Encounter: Payer: Self-pay | Admitting: Internal Medicine

## 2019-05-20 ENCOUNTER — Other Ambulatory Visit: Payer: Self-pay

## 2019-05-20 VITALS — BP 118/70 | HR 60 | Temp 97.8°F | Ht 69.0 in | Wt 111.0 lb

## 2019-05-20 DIAGNOSIS — G3183 Dementia with Lewy bodies: Secondary | ICD-10-CM

## 2019-05-20 DIAGNOSIS — F0280 Dementia in other diseases classified elsewhere without behavioral disturbance: Secondary | ICD-10-CM | POA: Diagnosis not present

## 2019-05-20 DIAGNOSIS — R351 Nocturia: Secondary | ICD-10-CM

## 2019-05-20 DIAGNOSIS — I5032 Chronic diastolic (congestive) heart failure: Secondary | ICD-10-CM | POA: Diagnosis not present

## 2019-05-20 DIAGNOSIS — Z681 Body mass index (BMI) 19 or less, adult: Secondary | ICD-10-CM

## 2019-05-20 DIAGNOSIS — Z23 Encounter for immunization: Secondary | ICD-10-CM

## 2019-05-20 DIAGNOSIS — R636 Underweight: Secondary | ICD-10-CM

## 2019-05-20 NOTE — Patient Instructions (Signed)
cerave lotion after showers/baths to lower legs.

## 2019-05-20 NOTE — Progress Notes (Signed)
Location:  [facility]     Advanced Directives 08/09/2018  Does Patient Have a Medical Advance Directive? Yes  Type of Advance Directive [place]  Does patient want to make changes to medical advance directive? No - Patient declined  Copy of [place] in Chart? No - copy requested     Chief Complaint  Patient presents with  . Medical Management of Chronic Issues    59mth follow-up    HPI: Patient is a 78 y.o. male seen today for medical management of chronic diseases.    Wife present at follow up today.  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 His wife reports no behavioral incidents. He has dreams at night and is awoken from sleep several times a month. He understand that they are dreams and not hallucinations. His wife states he is able to complete tasks without interruption. He denies having trouble focusing. He has incidents throughout the day with his memory. His wife states he can make a pot of coffee, but cannot make breakfast. When he has trouble with his memory, he pauses and just stares. He is followed by Dr. Rondel Oh, last appointment was cancelled due to covid, eife is trying ti reschedule appointment.   His mobility has not declined from last visit. He is upset he is not as active as he use to be. Most days he likes to keep busy around the house. His wife states they do chores together in an effort to keep him safe but to also judge if his condition has worsened. He has not had any recent falls or injuries. He does not use any assistive devices to ambulate.   Episodes of nocturia are occurring 3-4 times a night. He does not report any incontinence episodes. He is able to ambulate to the bathroom without assistance. His wife states the path to the bathroom is clear from any loose rugs or chords. Bathroom is only 10-15 ft from the bed. Handle bars are in the bathroom for assistance.   He has maintained his weight by eating  three meals a day. He does not snack in between meals. Before dinner, he has a Boost protein shake. He drinks between 6-8 glasses of water a day.   At times he struggles with constipation, this has occurred for many years. He will eat drink coffee and eat more fruits and vegetables to stimulate his bowels. He is able to have a bowel movement every 3-5 days.   He is requesting a flu vaccine today       Past Medical History:  Diagnosis Date  . AC joint separation    Right shoulder  . Acute pericarditis 08/09/2018   Admitted 11/19 for pericarditis; ? Related to recent pacer implant; small eff on echo/no tamponade  . Anxiety   . Aortic regurgitation   . Complication of anesthesia   . Dementia, Lewy body with behavior disturbance ([place])   . Depression   . Diastolic dysfunction   . Hypertension   . Left leg pain   . Microcytic anemia   . 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 SSS (sick sinus syndrome) ([place])     Past Surgical History:  Procedure Laterality Date  . acromioclavicular separation  07/04/2016  . CHEST TUBE INSERTION Left 08/05/2018  . EYE SURGERY    . HERNIA REPAIR    . LEAD REVISION/REPAIR N/A 08/10/2018   Procedure: LEAD REVISION/REPAIR;  Surgeon: Deboraha Sprang, MD;  Location: [facility];  Service: Cardiovascular;  Laterality: N/A;  . NM MYOVIEW LTD    . PACEMAKER IMPLANT N/A 08/05/2018   Procedure: St Jude dual lead PACEMAKER IMPLANT;  Surgeon: Sanda Klein, MD;  Location: [facility];  Service: Cardiovascular;  Laterality: N/A;  . Right ingunial hernia repair    . US ECHOCARDIOGRAPHY      Allergies  Allergen Reactions  . Propofol Anaphylaxis    Outpatient Encounter Medications as of 05/20/2019  Medication Sig  . acetaminophen (TYLENOL) 325 MG tablet Take 650 mg by mouth every 6 (six) hours as needed for mild pain.  Marland Kitchen amLODipine (NORVASC) 2.5 MG tablet Take 2.5 mg by mouth daily.  Marland Kitchen aspirin  EC 81 MG tablet Take 81 mg by mouth daily.  . carbidopa-levodopa (SINEMET IR) 25-100 MG tablet Take 1 tablet by mouth 3 (three) times daily. (0830, 1330, & 2030)  . donepezil (ARICEPT) 10 MG tablet Take 10 mg by mouth daily.   Marland Kitchen escitalopram (LEXAPRO) 10 MG tablet Take 1 tablet (10 mg total) by mouth at bedtime.  Marland Kitchen L-Methylfolate-B12-B6-B2 (CEREFOLIN) 02-28-49-5 MG TABS Take 1 tablet by mouth daily.  . Melatonin-Pyridoxine (MELATIN PO) Take 3 mg by mouth at bedtime.  . Multiple Vitamin (MULTIVITAMIN WITH MINERALS) TABS tablet Take 1 tablet by mouth daily.   No facility-administered encounter medications on file as of 05/20/2019.     Review of Systems:  Review of Systems  Constitutional: Negative for activity change and fatigue.  HENT: Negative for dental problem, hearing loss and trouble swallowing.   Respiratory: Negative for cough, shortness of breath and wheezing.   Cardiovascular: Negative for chest pain, palpitations and leg swelling.  Gastrointestinal: Positive for constipation. Negative for diarrhea and nausea.  Genitourinary: Negative for dysuria and hematuria.       Nocturia  Musculoskeletal: Negative for arthralgias and back pain.  Neurological: Positive for weakness. Negative for dizziness, tremors and headaches.  Hematological: Bruises/bleeds easily.  Psychiatric/Behavioral: Negative for dysphoric mood and sleep disturbance. The patient is not nervous/anxious.     Health Maintenance  Topic Date Due  . PNA vac Low Risk Adult (2 of 2 - PPSV23) 08/09/2018  . INFLUENZA VACCINE  05/01/2019  . TETANUS/TDAP  06/21/2019    Physical Exam: Vitals:   05/20/19 1343  BP: 118/70  Pulse: 60  Temp: 97.8 F (36.6 C)  TempSrc: Oral  SpO2: 99%  Weight: 111 lb (50.3 kg)  Height: 5\' 9"  (1.753 m)   Body mass index is 16.39 kg/m. Physical Exam Vitals signs reviewed.  Constitutional:      Appearance: He is well-groomed and underweight.  HENT:     Head: Normocephalic.      Mouth/Throat:     Mouth: Mucous membranes are dry.  Cardiovascular:     Rate and Rhythm: Normal rate and regular rhythm.     Pulses: Normal pulses.     Heart sounds: Normal heart sounds. No murmur.  Pulmonary:     Effort: Pulmonary effort is normal. No respiratory distress.     Breath sounds: Normal breath sounds. No wheezing.  Abdominal:     General: Abdomen is flat. There is no distension.     Palpations: Abdomen is soft.     Tenderness: There is no abdominal tenderness.  Musculoskeletal: Normal range of motion.     Right lower leg: No edema.     Left lower leg: No edema.  Skin:    General: Skin  is warm and dry.     Capillary Refill: Capillary refill takes less than 2 seconds.     Comments: LLE and RLE dry and flaky  Neurological:     Mental Status: He is alert and oriented to person, place, and time.     Motor: No tremor or pronator drift.     Gait: Gait abnormal.  Psychiatric:        Attention and Perception: Attention normal.        Mood and Affect: Affect is flat.        Behavior: Behavior is cooperative.        Thought Content: Thought content normal.        Cognition and Memory: Memory is impaired.     Comments: Delayed responsiveness throughout examination     Labs reviewed: Basic Metabolic Panel: Recent Labs    08/03/18 1135 08/09/18 1311 08/09/18 1844 02/24/19 0902  NA 141 136 136 142  K 5.1 4.1 4.0 4.3  CL 97 97* 99 99  CO2 27  --  27 32  GLUCOSE 85 116* 192* 90  BUN 18 27* 21 15  CREATININE 1.08 1.10 1.07 1.14  CALCIUM 9.8  --  9.0 10.3  TSH  --   --  3.334 3.58   Liver Function Tests: Recent Labs    08/09/18 1844 02/24/19 0902  AST 46* 25  ALT 21 11  ALKPHOS 54  --   BILITOT 1.1 1.5*  PROT 5.7* 7.3  ALBUMIN 3.1*  --    No results for input(s): LIPASE, AMYLASE in the last 8760 hours. No results for input(s): AMMONIA in the last 8760 hours. CBC: Recent Labs    08/03/18 1135 08/09/18 1258 08/09/18 1311 02/24/19 0902  WBC 7.2 9.5  --   7.0  NEUTROABS  --  6.9  --  3,360  HGB 13.1 11.9* 12.6* 13.9  HCT 38.3 38.0* 37.0* 41.1  MCV 94 99.2  --  94.9  PLT 204 178  --  217   Lipid Panel: Recent Labs    02/24/19 0902  CHOL 222*  HDL 118  LDLCALC 86  TRIG 88  CHOLHDL 1.9   No results found for: HGBA1C  Procedures since last visit: No results found.  Assessment/Plan 1. Lewy body dementia without behavioral disturbance ([place]) - followed by Dr. Rondel Oh - stable condition, no signs of progression - continue sinemet as per Dr. Rondel Oh  2. Underweight - has not had any weight loss since last visit - he was in extremely good physical shape before diagnosis, majority of weight loss was probably muscle mass - continue eating three meals a day and supplement shakes - Encourage food that he likes versus low calorie options - TSH- future  3. Body mass index (BMI) of 19 or less in adult - no signs of cachexia - continue to have wife prompt him to eat since he is not motivated to do so on his own - continue supplement shakes, promote foods that he likes  4. Nocturia - PSA done in May 2020, normal results - has not progresses since last visit - concerned about falls - discussed falls prevention with wife and patient - PSA- future  5.Chronic diastolic CHF (congestive heart failure) ([place]) - followed by Dr. Loletha Grayer - stable, asymptomatic - complete blood count with differential/ platelets- future - complete metabolic panel with GFR- future - lipid panel- future  6. Need for influenza vaccination - administer High DOSE Fluad Quad today   Labs/tests ordered:  complete blood count with differential/platelets, complete metabolic panel with GFR, PSA, lipid panel, TSH- future Next appt:  Follow up in 4 months

## 2019-05-28 ENCOUNTER — Encounter: Payer: Self-pay | Admitting: Internal Medicine

## 2019-06-26 IMAGING — DX DG CHEST 1V PORT
1 series · 2 of 2 positions shown · non-contrast
Comparison: Chest radiograph August 05, 2018

CLINICAL DATA: Chest tube placement.

EXAM:
PORTABLE CHEST 1 VIEW

[Series 1: chest · 0.14mm/px · 2 of 2 slices shown]
[im 1/2]
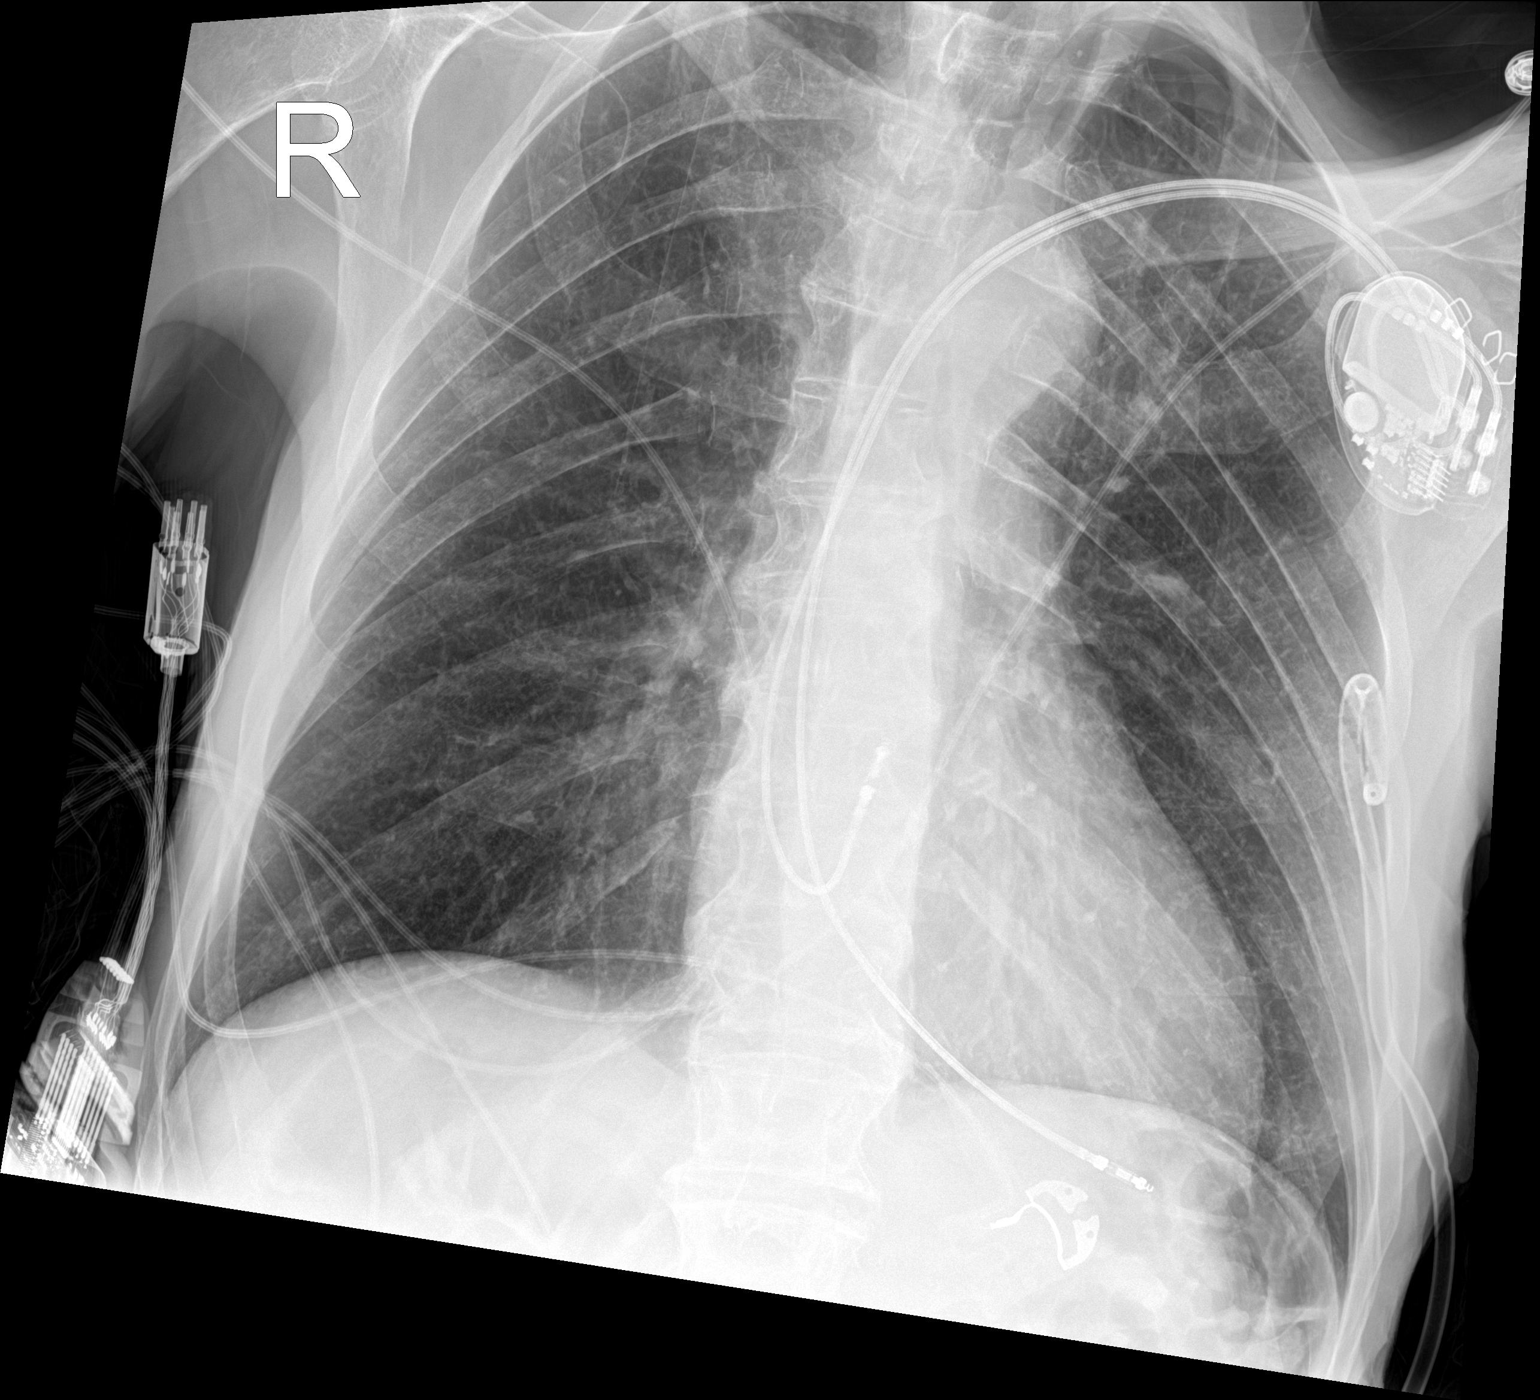
[im 2/2]
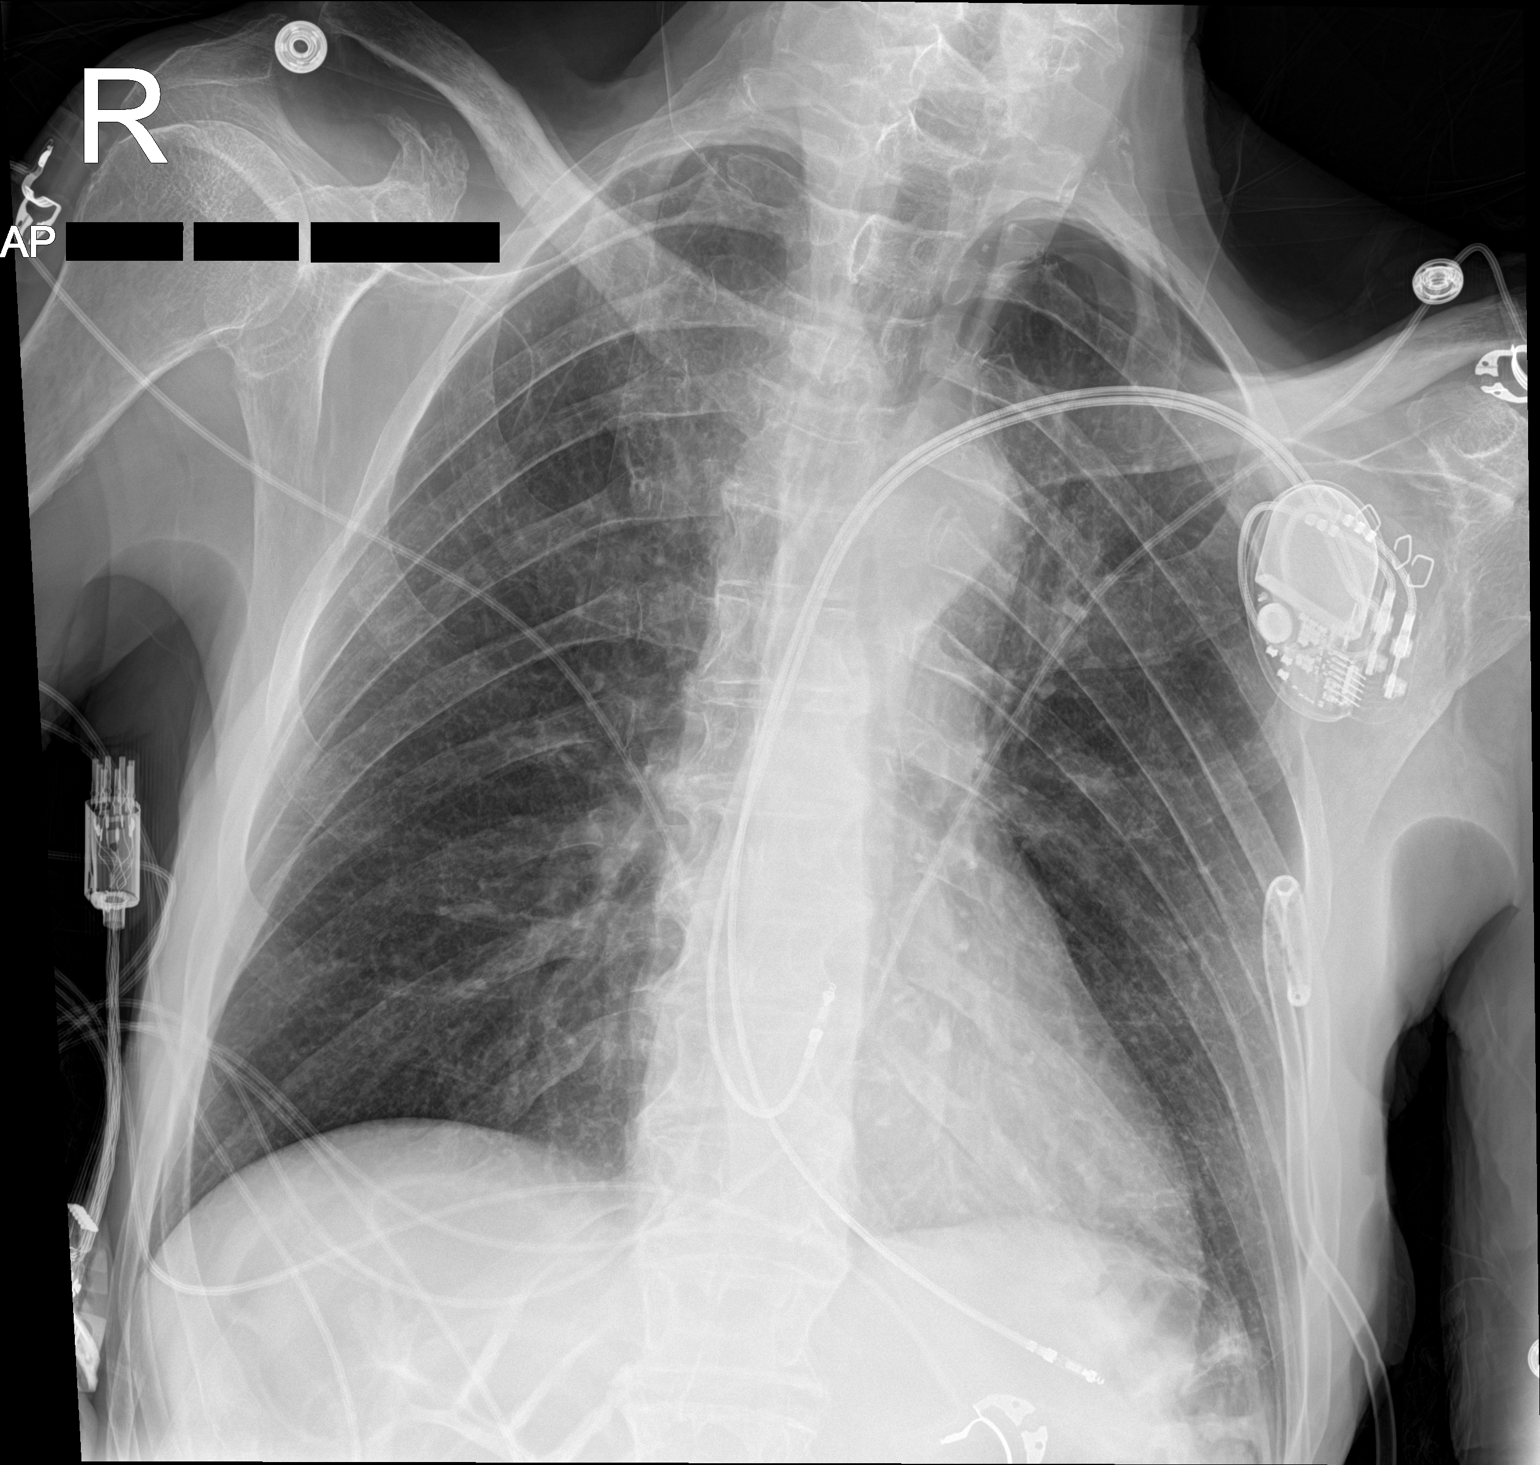

[2 of 2 positions shown; findings below may reference images not displayed]

FINDINGS: Patent LEFT chest tube with minimal residual LEFT apical
pneumothorax.

Cardiomediastinal silhouette is normal. Mildly calcified aortic
arch. No pleural effusion or focal consolidation. Soft tissue planes
and included osseous structures are non suspicious. LEFT dual lead
cardiac pacemaker in situ.
IMPRESSION: LEFT chest tube with predominately re-expanded LEFT lung, minimal
residual LEFT apical pneumothorax.

## 2019-06-27 IMAGING — CR DG CHEST 2V
2 series · 2 of 2 positions shown · non-contrast
Comparison: 08/05/2018.

CLINICAL DATA: Pneumothorax and pacemaker.  Soreness.

EXAM:
CHEST - 2 VIEW

[chest lat]
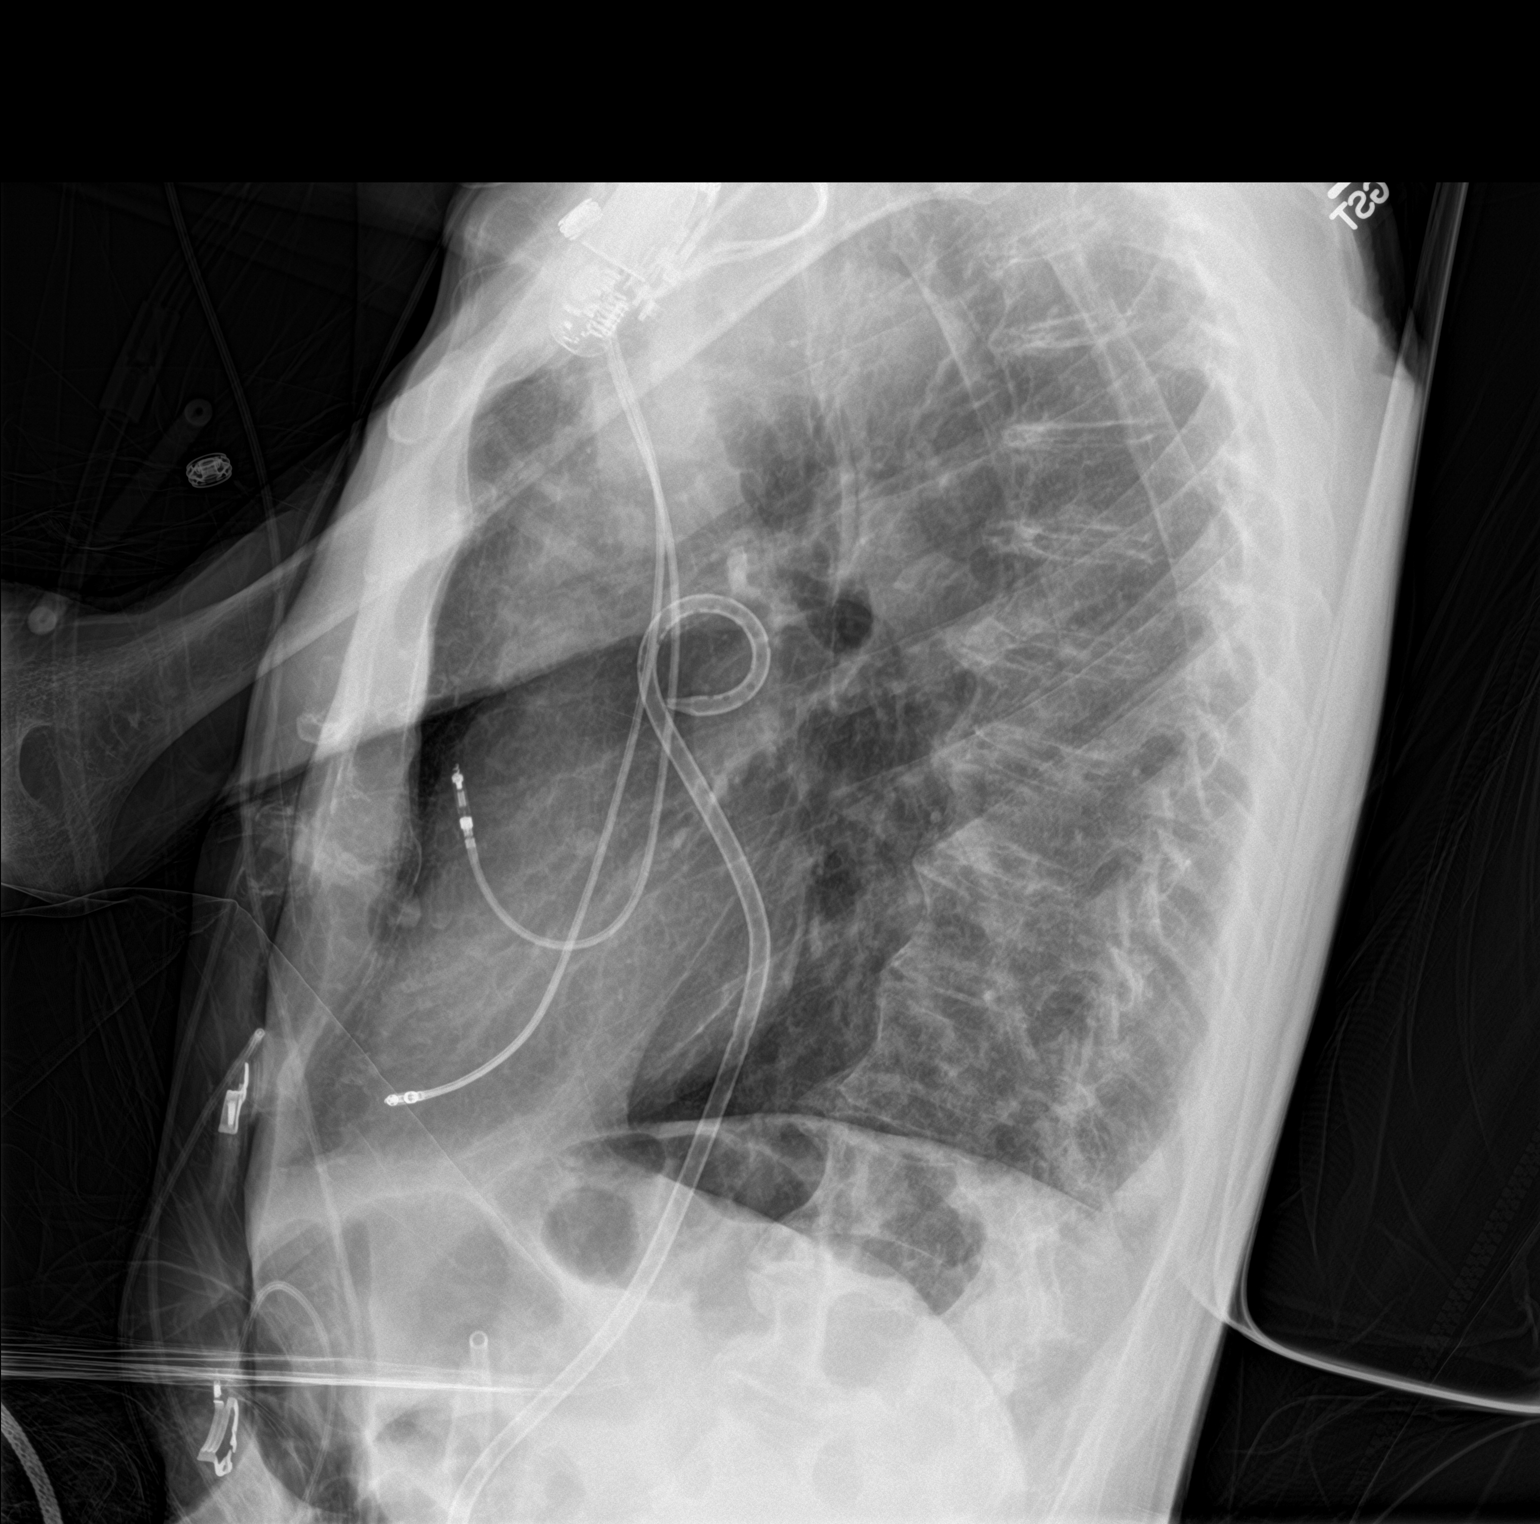

[chest ap]
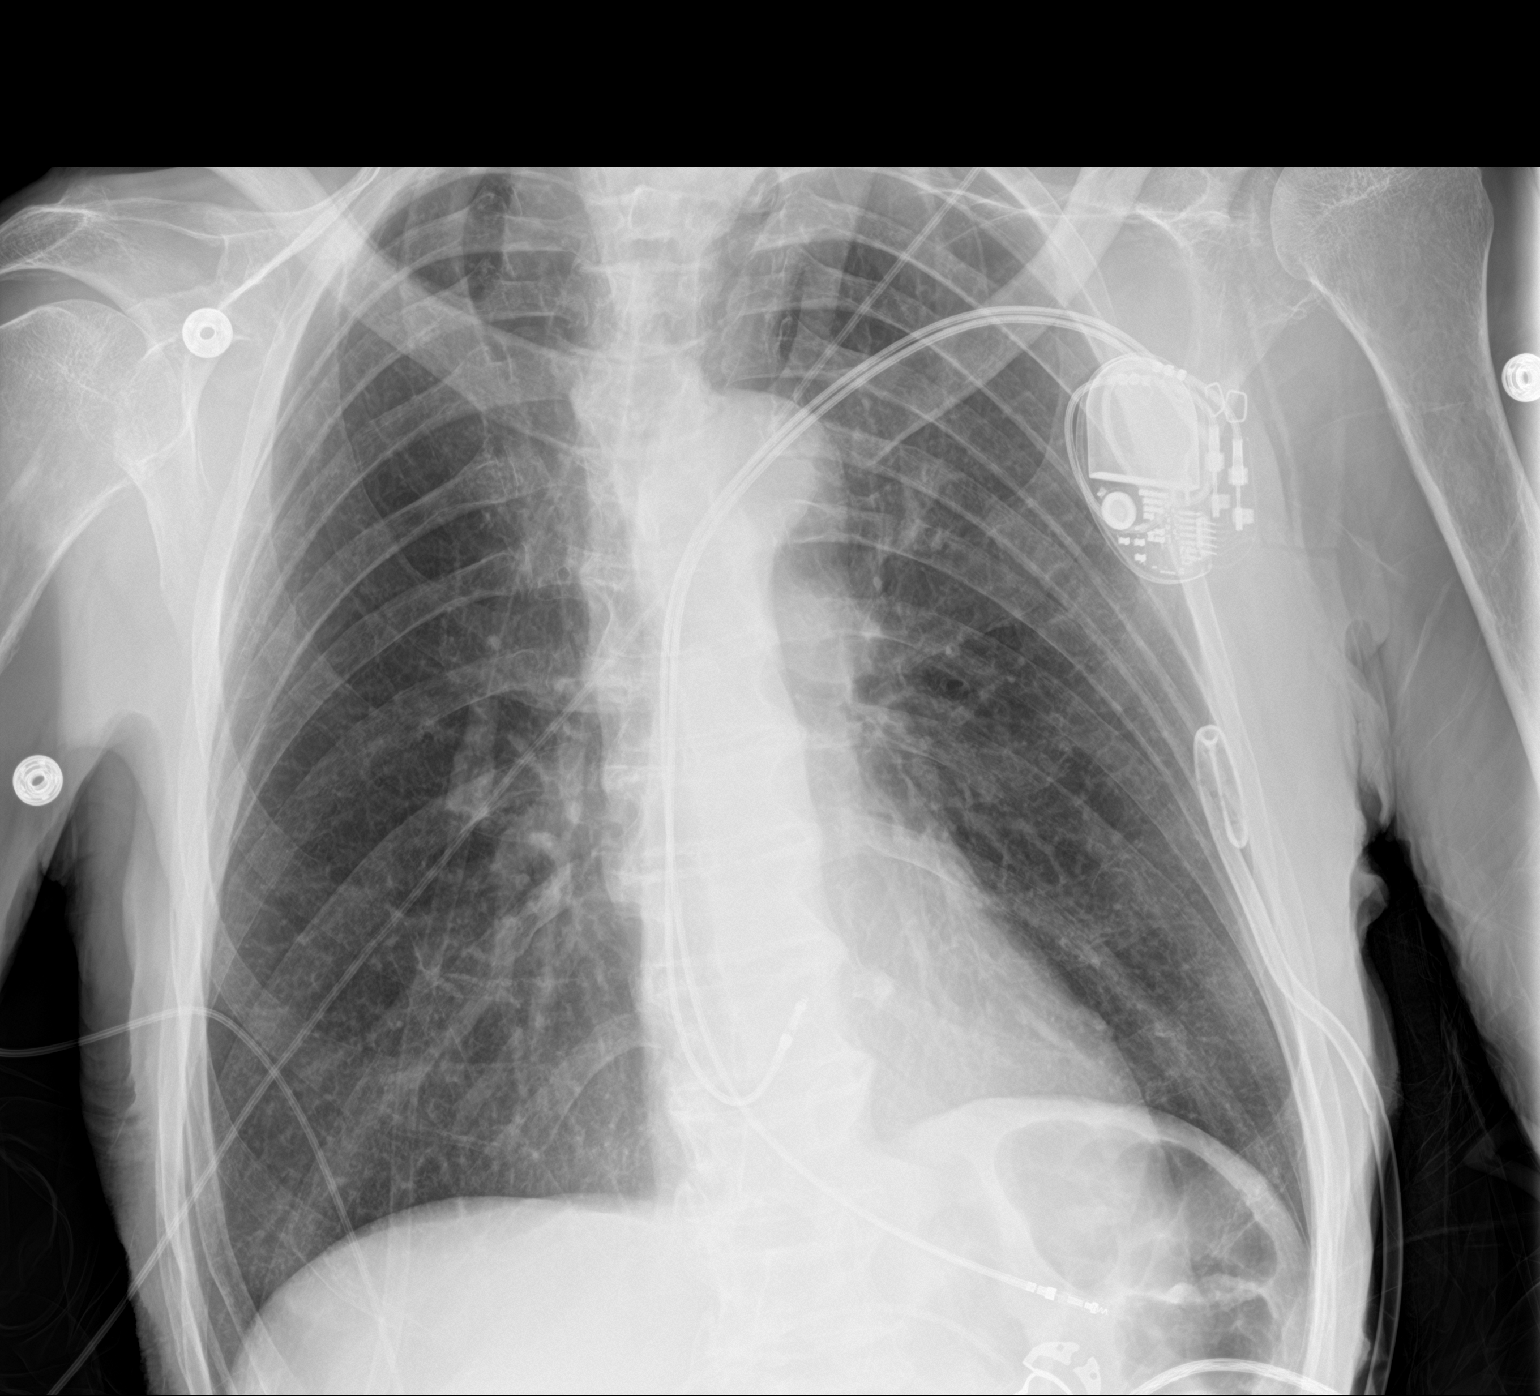

[2 of 2 positions shown; findings below may reference images not displayed]

FINDINGS: Unchanged cardiomediastinal silhouette and clear lung fields. Stable
dual lead pacemaker. Small LEFT apical pneumothorax is
redemonstrated. Pigtail catheter in the LEFT hemithorax is
unchanged.
IMPRESSION: Stable small LEFT apical pneumothorax.

## 2019-06-28 IMAGING — DX DG CHEST 1V PORT
1 series · 1 of 1 positions shown · non-contrast
Comparison: 08/06/2018

CLINICAL DATA: Status post pacemaker.  Followup pneumothorax.

EXAM:
PORTABLE CHEST 1 VIEW

[chest ap]
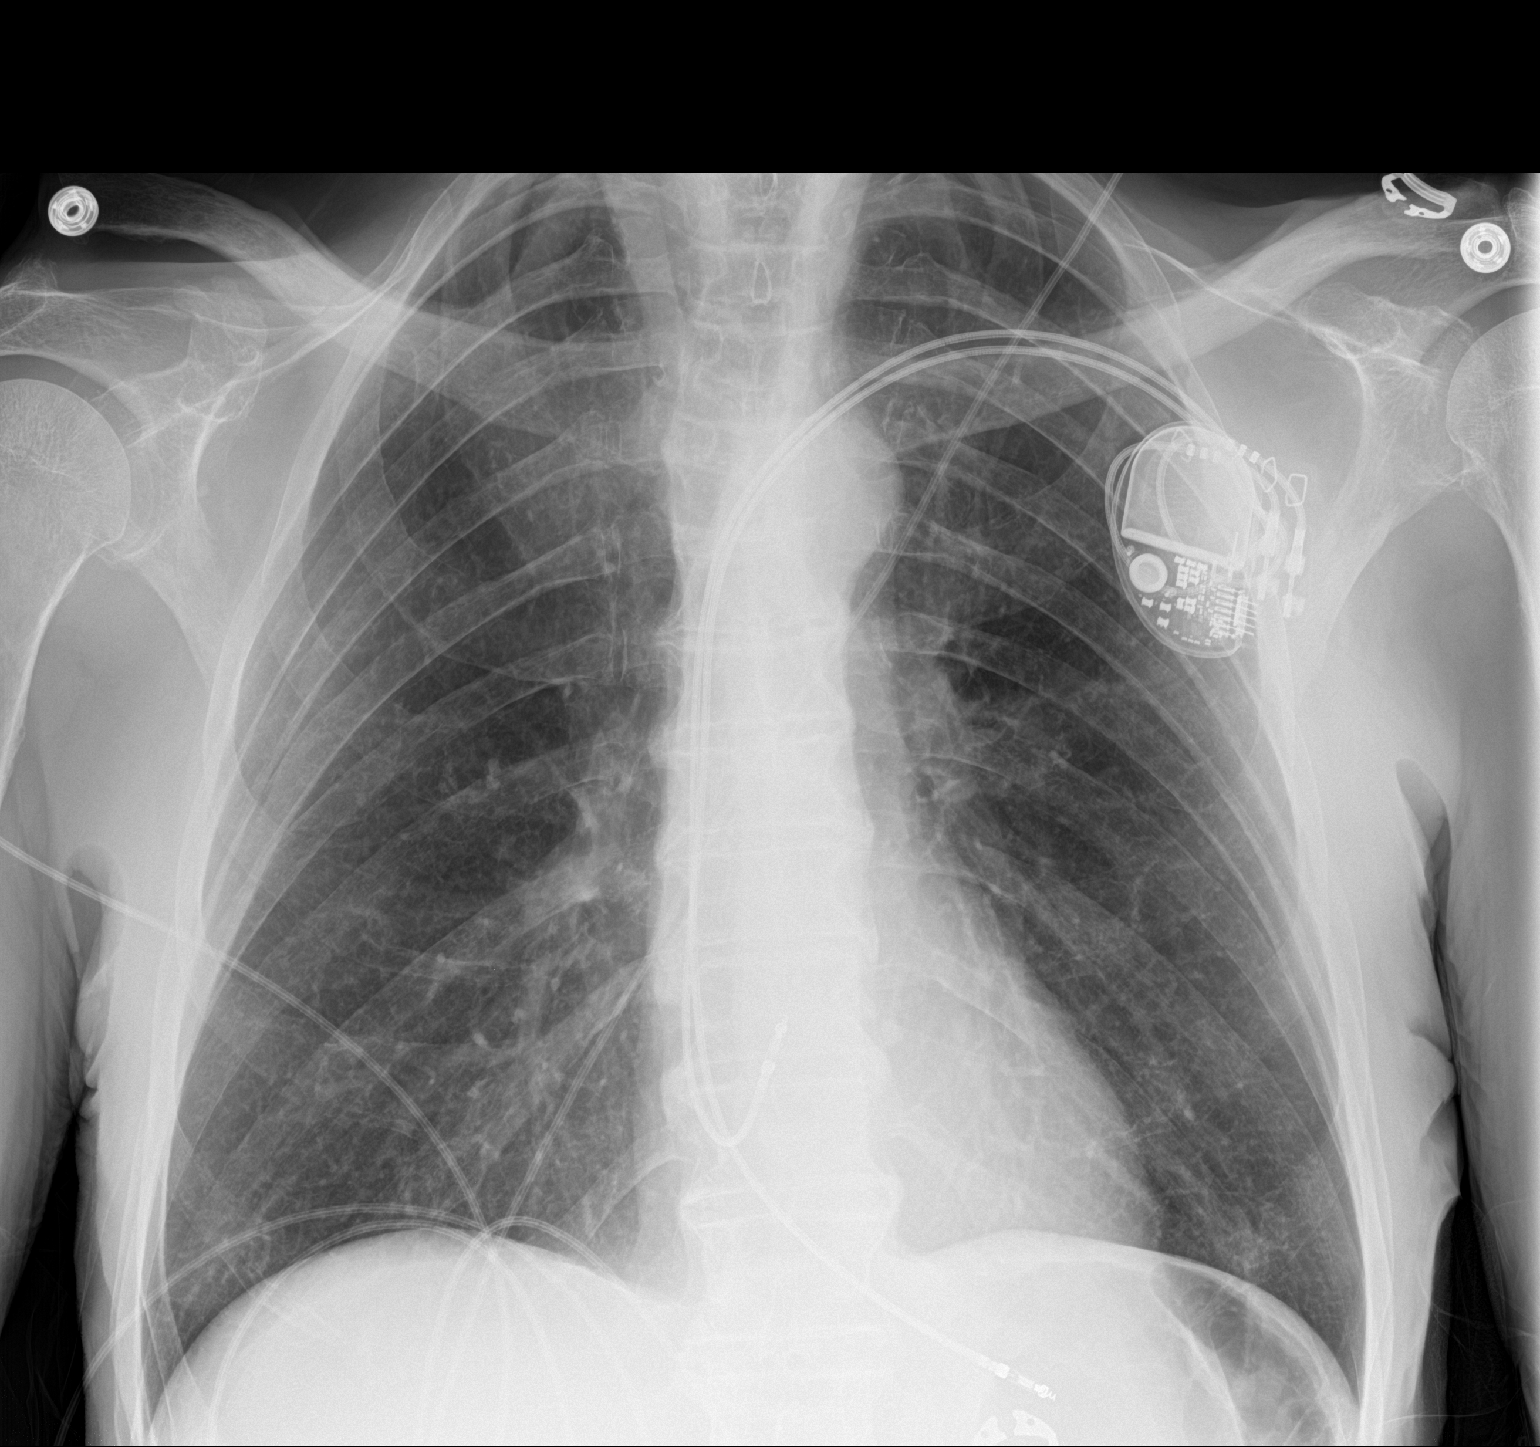

[1 of 1 positions shown; findings below may reference images not displayed]

FINDINGS: Minimal left apical pneumothorax, stable from the previous day's
exam.

Lungs are hyperexpanded but clear.

Stable left anterior chest wall sequential pacemaker
IMPRESSION: 1. Minimal left apical pneumothorax unchanged from the previous
day's exam.

## 2019-08-04 ENCOUNTER — Other Ambulatory Visit: Payer: Self-pay | Admitting: Internal Medicine

## 2019-08-10 ENCOUNTER — Ambulatory Visit (INDEPENDENT_AMBULATORY_CARE_PROVIDER_SITE_OTHER): Payer: Medicare Other | Admitting: *Deleted

## 2019-08-10 DIAGNOSIS — I495 Sick sinus syndrome: Secondary | ICD-10-CM

## 2019-08-10 LAB — CUP PACEART REMOTE DEVICE CHECK
Battery Remaining Longevity: 122 mo
Battery Remaining Percentage: 95.5 %
Battery Voltage: 3.01 V
Brady Statistic AP VP Percent: 1 %
Brady Statistic AP VS Percent: 82 %
Brady Statistic AS VP Percent: 1 %
Brady Statistic AS VS Percent: 18 %
Brady Statistic RA Percent Paced: 81 %
Brady Statistic RV Percent Paced: 1 %
Date Time Interrogation Session: 20201110070013
Implantable Lead Implant Date: 20191106
Implantable Lead Implant Date: 20191111
Implantable Lead Location: 753859
Implantable Lead Location: 753860
Implantable Lead Model: 1948
Implantable Pulse Generator Implant Date: 20191106
Lead Channel Impedance Value: 410 Ohm
Lead Channel Impedance Value: 610 Ohm
Lead Channel Pacing Threshold Amplitude: 0.375 V
Lead Channel Pacing Threshold Amplitude: 0.5 V
Lead Channel Pacing Threshold Pulse Width: 0.5 ms
Lead Channel Pacing Threshold Pulse Width: 0.5 ms
Lead Channel Sensing Intrinsic Amplitude: 1.5 mV
Lead Channel Sensing Intrinsic Amplitude: 5.4 mV
Lead Channel Setting Pacing Amplitude: 0.625
Lead Channel Setting Pacing Amplitude: 1.5 V
Lead Channel Setting Pacing Pulse Width: 0.5 ms
Lead Channel Setting Sensing Sensitivity: 0.5 mV
Pulse Gen Model: 2272
Pulse Gen Serial Number: 9083517

## 2019-08-20 ENCOUNTER — Ambulatory Visit (INDEPENDENT_AMBULATORY_CARE_PROVIDER_SITE_OTHER): Payer: Medicare Other | Admitting: Family

## 2019-08-20 ENCOUNTER — Encounter: Payer: Self-pay | Admitting: Family

## 2019-08-20 ENCOUNTER — Other Ambulatory Visit: Payer: Self-pay

## 2019-08-20 DIAGNOSIS — Z20828 Contact with and (suspected) exposure to other viral communicable diseases: Secondary | ICD-10-CM

## 2019-08-20 DIAGNOSIS — Z20822 Contact with and (suspected) exposure to covid-19: Secondary | ICD-10-CM

## 2019-08-20 MED ORDER — VITAMIN C 500 MG PO TABS: 500.0000 mg | ORAL_TABLET | Freq: Two times a day (BID) | ORAL | 0 refills | Status: AC

## 2019-08-20 NOTE — Patient Instructions (Addendum)
This information is directly available on the CDC website: https://www.cdc.gov/coronavirus/2019-ncov/if-you-are-sick/steps-when-sick.html    Source:CDC Reference to specific commercial products, manufacturers, companies, or trademarks does not constitute its endorsement or recommendation by the U.S. Government, Department of Health and Human Services, or Centers for Disease Control and Prevention.  

## 2019-08-20 NOTE — Progress Notes (Addendum)
This service is provided via telemedicine  No vital signs collected/recorded due to the encounter was a telemedicine visit.   Location of patient (ex: home, work):  Home   Patient consents to a telephone visit:  Yes  Location of the provider (ex: office, home):  Office   Name of any referring provider:  Hollace Kinnier, D.O.   Names of all persons participating in the telemedicine service and their role in the encounter:  Marlowe Sax, NP, Ruthell Rummage CMA, and patient   Time spent on call:  Ruthell Rummage CMA, spent 5 minutes on phone with patient.   Provider: Nickie Warwick FNP-C  Gayland Curry, DO  Patient Care Team: Gayland Curry, DO as PCP - General (Geriatric Medicine) Sanda Klein, MD as PCP - Cardiology (Cardiology)  Extended Emergency Contact Information Primary Emergency Contact: Guilmette,[name] H Address: [place], [place] [address] [address] of [place] Phone: [phone] Relation: Spouse Secondary Emergency Contact: Layne Benton Mobile Phone: [phone] Relation: Daughter  Code Status:  Full Code  Goals of care: Advanced Directive information Advanced Directives 08/09/2018  Does Patient Have a Medical Advance Directive? Yes  Type of Advance Directive [place]  Does patient want to make changes to medical advance directive? No - Patient declined  Copy of [place] in Chart? No - copy requested     Chief Complaint  Patient presents with  . Acute Visit    Patient believes he was exposed to COVID     HPI:  Pt is a 78 y.o. male seen today for an acute visit for evaluation of possible COVID-19 exposure.wife was exposed to a hairdresser whose husband tested positive for COVID-19 on 11/117/20.He denies any symptoms for COVID-19.His appetite is good.would like to be tested due to advance age and high risk for [id number] complication.    Past Medical History:  Diagnosis Date  . AC joint  separation    Right shoulder  . Acute pericarditis 08/09/2018   Admitted 11/19 for pericarditis; ? Related to recent pacer implant; small eff on echo/no tamponade  . Anxiety   . Aortic regurgitation   . Complication of anesthesia   . Dementia, Lewy body with behavior disturbance ([place])   . Depression   . Diastolic dysfunction   . Hypertension   . Left leg pain   . Microcytic anemia   . 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 SSS (sick sinus syndrome) ([place])    Past Surgical History:  Procedure Laterality Date  . acromioclavicular separation  07/04/2016  . CHEST TUBE INSERTION Left 08/05/2018  . EYE SURGERY    . HERNIA REPAIR    . LEAD REVISION/REPAIR N/A 08/10/2018   Procedure: LEAD REVISION/REPAIR;  Surgeon: Deboraha Sprang, MD;  Location: [facility];  Service: Cardiovascular;  Laterality: N/A;  . NM MYOVIEW LTD    . PACEMAKER IMPLANT N/A 08/05/2018   Procedure: St Jude dual lead PACEMAKER IMPLANT;  Surgeon: Sanda Klein, MD;  Location: [facility];  Service: Cardiovascular;  Laterality: N/A;  . Right ingunial hernia repair    . US ECHOCARDIOGRAPHY      Allergies  Allergen Reactions  . Propofol Anaphylaxis    Outpatient Encounter Medications as of 08/20/2019  Medication Sig  . acetaminophen (TYLENOL) 325 MG tablet Take 650 mg by mouth every 6 (six) hours as needed for  mild pain.  Marland Kitchen amLODipine (NORVASC) 2.5 MG tablet Take 2.5 mg by mouth daily.  Marland Kitchen aspirin EC 81 MG tablet Take 81 mg by mouth daily.  . carbidopa-levodopa (SINEMET IR) 25-100 MG tablet Take 1 tablet by mouth 3 (three) times daily. (0830, 1330, & 2030)  . donepezil (ARICEPT) 10 MG tablet Take 10 mg by mouth daily.   Marland Kitchen escitalopram (LEXAPRO) 10 MG tablet TAKE 1 TABLET BY MOUTH AT BEDTIME  . L-Methylfolate-B12-B6-B2 (CEREFOLIN) 02-28-49-5 MG TABS Take 1 tablet by mouth daily.  . Melatonin-Pyridoxine (MELATIN PO) Take 3 mg by mouth at bedtime.  .  Multiple Vitamin (MULTIVITAMIN WITH MINERALS) TABS tablet Take 1 tablet by mouth daily.   No facility-administered encounter medications on file as of 08/20/2019.     Review of Systems  Constitutional: Negative for chills, fatigue and fever.  HENT: Negative for congestion, rhinorrhea, sinus pressure, sinus pain, sneezing and sore throat.   Respiratory: Negative for cough, chest tightness, shortness of breath and wheezing.   Cardiovascular: Negative for chest pain, palpitations and leg swelling.  Gastrointestinal: Negative for abdominal distention, abdominal pain, constipation, diarrhea, nausea and vomiting.  Musculoskeletal: Negative for myalgias.  Skin: Negative for color change, pallor and rash.  Neurological: Negative for dizziness, speech difficulty, weakness, light-headedness, numbness and headaches.  Hematological: Does not bruise/bleed easily.  Psychiatric/Behavioral: Negative for agitation and sleep disturbance. The patient is not nervous/anxious.     Immunization History  Administered Date(s) Administered  . Fluad Quad(high Dose 65+) 05/20/2019  . Influenza-Unspecified 06/27/2011, 06/26/2012, 06/21/2013, 07/14/2014, 06/14/2015, 07/17/2016, 05/31/2017, 05/31/2018  . Pneumococcal Conjugate-13 08/09/2017  . Pneumococcal-Unspecified 03/22/2006  . Tdap 06/20/2009   Pertinent  Health Maintenance Due  Topic Date Due  . PNA vac Low Risk Adult (2 of 2 - PPSV23) 08/09/2018  . INFLUENZA VACCINE  Completed   Fall Risk  08/20/2019 05/20/2019 02/18/2019  Falls in the past year? 0 0 0  Number falls in past yr: 0 0 0  Injury with Fall? 0 0 0   There were no vitals filed for this visit. There is no height or weight on file to calculate BMI. Physical Exam Unable to complete on telephone visit.   Labs reviewed: Recent Labs    02/24/19 0902  NA 142  K 4.3  CL 99  CO2 32  GLUCOSE 90  BUN 15  CREATININE 1.14  CALCIUM 10.3   Recent Labs    02/24/19 0902  AST 25  ALT 11   BILITOT 1.5*  PROT 7.3   Recent Labs    02/24/19 0902  WBC 7.0  NEUTROABS 3,360  HGB 13.9  HCT 41.1  MCV 94.9  PLT 217   Lab Results  Component Value Date   TSH 3.58 02/24/2019   No results found for: HGBA1C Lab Results  Component Value Date   CHOL 222 (H) 02/24/2019   HDL 118 02/24/2019   LDLCALC 86 02/24/2019   TRIG 88 02/24/2019   CHOLHDL 1.9 02/24/2019    Significant Diagnostic Results in last 30 days:  No results found.  Assessment/Plan  Exposure to COVID-19 virus Afebrile.Asymptomatic.wife was exposure to hairdresser whose husband tested positive for COVID-19 due to his advance age and high risk for [id number] complication will send for testing at [address] Advised to continue to wear a mask,practice social distancing,hand hygiene and self Quarantine x 14 days until symptoms free for 72 hour. May take vitamin C 500 mg tablet one by mouth twice daily for 14 days for prophylaxis.currently  on Vit D and MVI with Zinc.Encouraged to cut down on sugary foods and increase fruits in diet to Optimize his immunity. - encouraged to call 9-1-1 or go to ED if he develops any shortness of breath,chest pain,chest tightness or wheezing. - Novel Coronavirus, NAA ([facility]); Future -CDC COVID-19 guidelines attached on Today's AVS.   Family/ staff Communication: Reviewed plan of care with patient and wife  Labs/tests ordered: - Novel Coronavirus, NAA ([facility]); Future  Spent 11 minutes of non-face to face with patient   Sandrea Hughs, NP

## 2019-08-23 ENCOUNTER — Other Ambulatory Visit: Payer: Self-pay

## 2019-08-23 DIAGNOSIS — Z20828 Contact with and (suspected) exposure to other viral communicable diseases: Secondary | ICD-10-CM | POA: Diagnosis not present

## 2019-08-23 DIAGNOSIS — Z20822 Contact with and (suspected) exposure to covid-19: Secondary | ICD-10-CM

## 2019-08-24 LAB — NOVEL CORONAVIRUS, NAA: SARS-CoV-2, NAA: NOT DETECTED

## 2019-08-25 ENCOUNTER — Other Ambulatory Visit: Payer: Self-pay | Admitting: Internal Medicine

## 2019-08-31 NOTE — Progress Notes (Signed)
Remote pacemaker transmission.   

## 2019-09-13 ENCOUNTER — Other Ambulatory Visit: Payer: Self-pay | Admitting: Internal Medicine

## 2019-09-30 ENCOUNTER — Ambulatory Visit: Payer: Medicare Other | Admitting: Internal Medicine

## 2019-10-11 ENCOUNTER — Ambulatory Visit (INDEPENDENT_AMBULATORY_CARE_PROVIDER_SITE_OTHER): Payer: Medicare Other | Admitting: Internal Medicine

## 2019-10-11 ENCOUNTER — Other Ambulatory Visit: Payer: Self-pay

## 2019-10-11 ENCOUNTER — Encounter: Payer: Self-pay | Admitting: Internal Medicine

## 2019-10-11 VITALS — BP 118/72 | HR 68 | Temp 97.1°F | Ht 69.0 in | Wt 113.8 lb

## 2019-10-11 DIAGNOSIS — Z950 Presence of cardiac pacemaker: Secondary | ICD-10-CM | POA: Diagnosis not present

## 2019-10-11 DIAGNOSIS — I5032 Chronic diastolic (congestive) heart failure: Secondary | ICD-10-CM

## 2019-10-11 DIAGNOSIS — R351 Nocturia: Secondary | ICD-10-CM | POA: Diagnosis not present

## 2019-10-11 DIAGNOSIS — F0280 Dementia in other diseases classified elsewhere without behavioral disturbance: Secondary | ICD-10-CM

## 2019-10-11 DIAGNOSIS — G3183 Dementia with Lewy bodies: Secondary | ICD-10-CM

## 2019-10-11 DIAGNOSIS — R636 Underweight: Secondary | ICD-10-CM

## 2019-10-11 DIAGNOSIS — F324 Major depressive disorder, single episode, in partial remission: Secondary | ICD-10-CM | POA: Diagnosis not present

## 2019-10-11 DIAGNOSIS — I495 Sick sinus syndrome: Secondary | ICD-10-CM

## 2019-10-11 NOTE — Patient Instructions (Addendum)
People 62 and older can get a COVID-19 vaccination from [facility]. Initially, a few 67 people will be vaccinated a day. This will allow staff to work through processes as [employer] prepares for larger scale vaccinations later in the month.   [facility] in [place] by appointment only.  Register in advance. That can be done online at [email or web address] or by calling [phone]. You do not have to be a resident of [facility] to be vaccinated.

## 2019-10-11 NOTE — Progress Notes (Signed)
Location:  [facility] clinic  Provider: Dr. Hollace Kinnier   Goals of Care:  Advanced Directives 10/11/2019  Does Patient Have a Medical Advance Directive? Yes  Type of Advance Directive Living will  Does patient want to make changes to medical advance directive? No - Patient declined  Copy of [place] in Chart? -     Chief Complaint  Patient presents with  . Medical Management of Chronic Issues    4 month follow up     HPI: Patient is a 79 y.o. male seen today for medical management of chronic diseases.    Wife present for encounter.   Wife reports husband responsiveness is more delayed. No behavioral outbursts. He is still having vivid dreams, no more than last checkup. Denies hallucinations.   Ambulates with no problem. Does not use assistive device. No falls or recent injuries. He is still able to help his wife with chores around the house.   Eats 3-4 meals a day. Continues to drink Boost daily.   Sleeping 6-7 hours a day. Does not nap throughout the day. Wife states his bedtime is around 2330. Does not appear to sundown, but may have trouble initially falling asleep.   Still experiencing dry skin throughout his body. Using [place] and thinks it is helping. Denies any itching.   Still having normal bowel movements.   Continues to get up 2-3 times a night to urinate.   Interested in having covid vaccine, would like more information.    Past Medical History:  Diagnosis Date  . AC joint separation    Right shoulder  . Acute pericarditis 08/09/2018   Admitted 11/19 for pericarditis; ? Related to recent pacer implant; small eff on echo/no tamponade  . Anxiety   . Aortic regurgitation   . Complication of anesthesia   . Dementia, Lewy body with behavior disturbance ([place])   . Depression   . Diastolic dysfunction   . Hypertension   . Left leg pain   . Microcytic anemia   . 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 SSS (sick sinus syndrome) ([place])     Past Surgical History:  Procedure Laterality Date  . acromioclavicular separation  07/04/2016  . CHEST TUBE INSERTION Left 08/05/2018  . EYE SURGERY    . HERNIA REPAIR    . LEAD REVISION/REPAIR N/A 08/10/2018   Procedure: LEAD REVISION/REPAIR;  Surgeon: Deboraha Sprang, MD;  Location: [place] [facility];  Service: Cardiovascular;  Laterality: N/A;  . NM MYOVIEW LTD    . PACEMAKER IMPLANT N/A 08/05/2018   Procedure: St Jude dual lead PACEMAKER IMPLANT;  Surgeon: Sanda Klein, MD;  Location: [place] [facility];  Service: Cardiovascular;  Laterality: N/A;  . Right ingunial hernia repair    . US ECHOCARDIOGRAPHY      Allergies  Allergen Reactions  . Propofol Anaphylaxis    Outpatient Encounter Medications as of 10/11/2019  Medication Sig  . acetaminophen (TYLENOL) 325 MG tablet Take 650 mg by mouth every 6 (six) hours as needed for mild pain.  Marland Kitchen amLODipine (NORVASC) 2.5 MG tablet Take 2.5 mg by mouth daily.  Marland Kitchen aspirin EC 81 MG tablet Take 81 mg by mouth daily.  . carbidopa-levodopa (SINEMET IR) 25-100 MG tablet Take 1 tablet by mouth 3 (three) times daily. (0830, 1330, & 2030)  . donepezil (ARICEPT) 10 MG tablet Take 10 mg by mouth daily.   Marland Kitchen escitalopram (LEXAPRO) 10 MG  tablet TAKE 1 TABLET BY MOUTH AT BEDTIME  . L-Methylfolate-B12-B6-B2 (CEREFOLIN) 02-28-49-5 MG TABS TAKE 1 TABLET BY MOUTH EVERY DAY  . Melatonin-Pyridoxine (MELATIN PO) Take 3 mg by mouth at bedtime.  . Multiple Vitamin (MULTIVITAMIN WITH MINERALS) TABS tablet Take 1 tablet by mouth daily.   No facility-administered encounter medications on file as of 10/11/2019.    Review of Systems:  Review of Systems  Constitutional: Negative for activity change, appetite change and fatigue.  HENT: Negative for dental problem, hearing loss and trouble swallowing.   Eyes: Negative for photophobia and visual disturbance.  Respiratory: Negative for cough, shortness of breath and  wheezing.   Cardiovascular: Negative for chest pain and leg swelling.  Gastrointestinal: Negative for abdominal pain, constipation, diarrhea and nausea.  Genitourinary: Positive for frequency. Negative for dysuria and hematuria.  Musculoskeletal: Negative for arthralgias and myalgias.  Skin:       Dry skin  Neurological: Positive for tremors. Negative for weakness and headaches.  Psychiatric/Behavioral: Negative for dysphoric mood and sleep disturbance. The patient is not nervous/anxious.   All other systems reviewed and are negative.   Health Maintenance  Topic Date Due  . PNA vac Low Risk Adult (2 of 2 - PPSV23) 08/09/2018  . TETANUS/TDAP  06/21/2019  . INFLUENZA VACCINE  Completed    Physical Exam: Vitals:   10/11/19 1508  BP: 118/72  Pulse: 68  Temp: (!) 97.1 F (36.2 C)  TempSrc: Temporal  SpO2: 98%  Weight: 113 lb 12.8 oz (51.6 kg)  Height: 5\' 9"  (1.753 m)   Body mass index is 16.81 kg/m. Physical Exam Vitals reviewed.  Constitutional:      General: He is not in acute distress.    Appearance: Normal appearance.     Comments: underweight  Eyes:     Comments: Pinpoint pupils  Cardiovascular:     Rate and Rhythm: Normal rate and regular rhythm.     Pulses: Normal pulses.     Heart sounds: Normal heart sounds. No murmur.  Pulmonary:     Effort: Pulmonary effort is normal. No respiratory distress.     Breath sounds: Normal breath sounds. No wheezing.  Abdominal:     General: Abdomen is flat. Bowel sounds are normal.     Palpations: Abdomen is soft.  Musculoskeletal:     Right lower leg: No edema.     Left lower leg: No edema.  Skin:    General: Skin is warm and dry.     Capillary Refill: Capillary refill takes less than 2 seconds.  Neurological:     Mental Status: He is alert and oriented to person, place, and time.     Motor: No weakness.     Coordination: Coordination normal.     Gait: Gait normal.     Deep Tendon Reflexes: Reflexes normal.    Psychiatric:        Mood and Affect: Mood normal. Affect is flat.        Behavior: Behavior normal.        Thought Content: Thought content normal.        Cognition and Memory: Memory is impaired.        Judgment: Judgment normal.     Labs reviewed: Basic Metabolic Panel: Recent Labs    02/24/19 0902  NA 142  K 4.3  CL 99  CO2 32  GLUCOSE 90  BUN 15  CREATININE 1.14  CALCIUM 10.3  TSH 3.58   Liver Function Tests: Recent Labs  02/24/19 0902  AST 25  ALT 11  BILITOT 1.5*  PROT 7.3   No results for input(s): LIPASE, AMYLASE in the last 8760 hours. No results for input(s): AMMONIA in the last 8760 hours. CBC: Recent Labs    02/24/19 0902  WBC 7.0  NEUTROABS 3,360  HGB 13.9  HCT 41.1  MCV 94.9  PLT 217   Lipid Panel: Recent Labs    02/24/19 0902  CHOL 222*  HDL 118  LDLCALC 86  TRIG 88  CHOLHDL 1.9   No results found for: HGBA1C  Procedures since last visit: No results found.  Assessment/Plan 1. Lewy body dementia without behavioral disturbance ([place]) - followed by Dr. Rondel Oh but has not seen him due to covid - his delayed responsiveness may be a sign disease has progressed, otherwise still very mobile without behavioral outbursts - continue sinemet regimen  2. Underweight - he has gained 2 pounds since last visit - continue to eat 3-4 meals daily - continue boost shake daily - encourage eating foods that he enjoys versus healthier/ low-calorie options  3. Chronic diastolic CHF (congestive heart failure) (HCC) - followed by Dr. Sallyanne Kuster - stable at this time - continue amlodipine and asa regimen  4. Depression, major, single episode, in partial remission ([place]) - stable at this time, no progression - continue current lexapro regimen  5. Sick sinus syndrome (HCC) - followed by Dr. Sallyanne Kuster - stable at this time, no progression  6. Pacemaker - checked at [facility] 08/10/2019 - stable at this time  7. Nocturia - stable, no  progression - concerned about falls - falls prevention plan discussed with wife - PSA- future   Labs/tests ordered:  Cbc with differential/platelets, complete metabolic panel with GFR, lipid panel, TSH, PSA- future Next appt:  4 month follow up

## 2019-11-07 ENCOUNTER — Emergency Department (HOSPITAL_COMMUNITY)
Admission: EM | Admit: 2019-11-07 | Discharge: 2019-11-07 | Disposition: A | Discharge status: Home / Self Care | Payer: Medicare Other | Attending: Emergency Medicine | Admitting: Emergency Medicine

## 2019-11-07 ENCOUNTER — Emergency Department (HOSPITAL_COMMUNITY): Payer: Medicare Other

## 2019-11-07 ENCOUNTER — Other Ambulatory Visit: Payer: Self-pay

## 2019-11-07 ENCOUNTER — Encounter (HOSPITAL_COMMUNITY): Payer: Self-pay | Admitting: Emergency Medicine

## 2019-11-07 DIAGNOSIS — I10 Essential (primary) hypertension: Secondary | ICD-10-CM | POA: Diagnosis not present

## 2019-11-07 DIAGNOSIS — R519 Headache, unspecified: Secondary | ICD-10-CM | POA: Diagnosis not present

## 2019-11-07 DIAGNOSIS — W19XXXA Unspecified fall, initial encounter: Secondary | ICD-10-CM | POA: Diagnosis not present

## 2019-11-07 DIAGNOSIS — Y929 Unspecified place or not applicable: Secondary | ICD-10-CM | POA: Diagnosis not present

## 2019-11-07 DIAGNOSIS — J984 Other disorders of lung: Secondary | ICD-10-CM | POA: Diagnosis not present

## 2019-11-07 DIAGNOSIS — Z950 Presence of cardiac pacemaker: Secondary | ICD-10-CM | POA: Insufficient documentation

## 2019-11-07 DIAGNOSIS — S0181XA Laceration without foreign body of other part of head, initial encounter: Secondary | ICD-10-CM

## 2019-11-07 DIAGNOSIS — Y9389 Activity, other specified: Secondary | ICD-10-CM | POA: Diagnosis not present

## 2019-11-07 DIAGNOSIS — I4581 Long QT syndrome: Secondary | ICD-10-CM | POA: Diagnosis not present

## 2019-11-07 DIAGNOSIS — Z23 Encounter for immunization: Secondary | ICD-10-CM | POA: Diagnosis not present

## 2019-11-07 DIAGNOSIS — S6992XA Unspecified injury of left wrist, hand and finger(s), initial encounter: Secondary | ICD-10-CM | POA: Insufficient documentation

## 2019-11-07 DIAGNOSIS — M5030 Other cervical disc degeneration, unspecified cervical region: Secondary | ICD-10-CM | POA: Diagnosis not present

## 2019-11-07 DIAGNOSIS — Y999 Unspecified external cause status: Secondary | ICD-10-CM | POA: Diagnosis not present

## 2019-11-07 DIAGNOSIS — Z79899 Other long term (current) drug therapy: Secondary | ICD-10-CM | POA: Diagnosis not present

## 2019-11-07 DIAGNOSIS — S0990XA Unspecified injury of head, initial encounter: Secondary | ICD-10-CM

## 2019-11-07 DIAGNOSIS — R9431 Abnormal electrocardiogram [ECG] [EKG]: Secondary | ICD-10-CM

## 2019-11-07 DIAGNOSIS — S01111A Laceration without foreign body of right eyelid and periocular area, initial encounter: Secondary | ICD-10-CM | POA: Insufficient documentation

## 2019-11-07 DIAGNOSIS — F0390 Unspecified dementia without behavioral disturbance: Secondary | ICD-10-CM | POA: Diagnosis not present

## 2019-11-07 DIAGNOSIS — I4589 Other specified conduction disorders: Secondary | ICD-10-CM | POA: Insufficient documentation

## 2019-11-07 DIAGNOSIS — M79642 Pain in left hand: Secondary | ICD-10-CM | POA: Diagnosis not present

## 2019-11-07 LAB — CBC WITH DIFFERENTIAL/PLATELET
Abs Immature Granulocytes: 0.02 10*3/uL (ref 0.00–0.07)
Basophils Absolute: 0.1 10*3/uL (ref 0.0–0.1)
Basophils Relative: 1 %
Eosinophils Absolute: 0.3 10*3/uL (ref 0.0–0.5)
Eosinophils Relative: 4 %
HCT: 42.3 % (ref 39.0–52.0)
Hemoglobin: 13.7 g/dL (ref 13.0–17.0)
Immature Granulocytes: 0 %
Lymphocytes Relative: 20 %
Lymphs Abs: 1.5 10*3/uL (ref 0.7–4.0)
MCH: 32.1 pg (ref 26.0–34.0)
MCHC: 32.4 g/dL (ref 30.0–36.0)
MCV: 99.1 fL (ref 80.0–100.0)
Monocytes Absolute: 0.6 10*3/uL (ref 0.1–1.0)
Monocytes Relative: 8 %
Neutro Abs: 5 10*3/uL (ref 1.7–7.7)
Neutrophils Relative %: 67 %
Platelets: 193 10*3/uL (ref 150–400)
RBC: 4.27 MIL/uL (ref 4.22–5.81)
RDW: 13.8 % (ref 11.5–15.5)
WBC: 7.5 10*3/uL (ref 4.0–10.5)
nRBC: 0 % (ref 0.0–0.2)

## 2019-11-07 LAB — URINALYSIS, ROUTINE W REFLEX MICROSCOPIC
Bilirubin Urine: NEGATIVE
Glucose, UA: NEGATIVE mg/dL
Hgb urine dipstick: NEGATIVE
Ketones, ur: 5 mg/dL — AB
Leukocytes,Ua: NEGATIVE
Nitrite: NEGATIVE
Protein, ur: NEGATIVE mg/dL
Specific Gravity, Urine: 1.012 (ref 1.005–1.030)
pH: 7 (ref 5.0–8.0)

## 2019-11-07 LAB — BASIC METABOLIC PANEL
Anion gap: 12 (ref 5–15)
BUN: 21 mg/dL (ref 8–23)
CO2: 30 mmol/L (ref 22–32)
Calcium: 9.7 mg/dL (ref 8.9–10.3)
Chloride: 97 mmol/L — ABNORMAL LOW (ref 98–111)
Creatinine, Ser: 0.97 mg/dL (ref 0.61–1.24)
GFR calc Af Amer: >60 mL/min (ref >60)
GFR calc non Af Amer: >60 mL/min (ref >60)
Glucose, Bld: 136 mg/dL — ABNORMAL HIGH (ref 70–99)
Potassium: 4.3 mmol/L (ref 3.5–5.1)
Sodium: 139 mmol/L (ref 135–145)

## 2019-11-07 MED ORDER — POVIDONE-IODINE 10 % EX SOLN
CUTANEOUS | Status: AC
  Administered 2019-11-07: 1
  Filled 2019-11-07: qty 15

## 2019-11-07 MED ORDER — LIDOCAINE-EPINEPHRINE (PF) 2 %-1:200000 IJ SOLN
20.0000 mL | Freq: Once | INTRAMUSCULAR | Status: AC
  Administered 2019-11-07: 20 mL
  Filled 2019-11-07: qty 20

## 2019-11-07 MED ORDER — TETANUS-DIPHTH-ACELL PERTUSSIS 5-2.5-18.5 LF-MCG/0.5 IM SUSP
0.5000 mL | Freq: Once | INTRAMUSCULAR | Status: AC
  Administered 2019-11-07: 0.5 mL via INTRAMUSCULAR
  Filled 2019-11-07: qty 0.5

## 2019-11-07 NOTE — Discharge Instructions (Signed)
There are no signs of serious injury or serious medical illness.  I looked at his pacemaker and it was functioning normally.  EKG did show a prolonged QT, which may be important down the road.  Please have his cardiologist evaluate him for that.  Keep the wound clean with soap and water daily.  Return here if you are concerned about worsening problems from the head injury.

## 2019-11-07 NOTE — ED Triage Notes (Signed)
Patient brought in via EMS. Alert. Patient oriented x3, disoriented to situation. Per wife patient went to take out trash. Wife checked on patient after he had not returned within 3 minutes. Patient was alert and trying to get up when wife found him. Patient doesn't recall events that led up to fall. Patient has laceration to right eyebrow with abrasions to rt knee, rt forearm/wrist, and left palm. Patient takes aspirin but no other anticoagulants. Hx of dementia.

## 2019-11-07 NOTE — ED Notes (Signed)
ED Provider at bedside. 

## 2019-11-07 NOTE — ED Provider Notes (Signed)
[facility] Provider Note   CSN: [id number] Arrival date & time: 11/07/19  1703     History Chief Complaint  Patient presents with  . Charles [name] is a 79 y.o. male.  HPI He presents for evaluation of injuries from fall.  He was taking trash out when he apparently fell.  This was unwitnessed.  His wife found him there.  He has injuries to his left palm and his right forehead.  He has severe dementia and cannot recall the events that happened.  He is able to communicate and complains only of mild headache.  He cannot give other history.\  Level 5 caveat-dementia    Past Medical History:  Diagnosis Date  . AC joint separation    Right shoulder  . Acute pericarditis 08/09/2018   Admitted 11/19 for pericarditis; ? Related to recent pacer implant; small eff on echo/no tamponade  . Anxiety   . Aortic regurgitation   . Complication of anesthesia   . Dementia, Lewy body with behavior disturbance ([place])   . Depression   . Diastolic dysfunction   . Hypertension   . Left leg pain   . Microcytic anemia   . 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 SSS (sick sinus syndrome) [facility])     Patient Active Problem List   Diagnosis Date Noted  . Pressure injury of skin 08/10/2018  . Acute pericarditis 08/09/2018  . Urethritis 08/07/2018  . Encounter for chest tube removal   . Pacemaker 08/05/2018  . Pneumothorax on left 08/05/2018  . SSS (sick sinus syndrome) ([place]) 07/14/2018  . Lewy body dementia with behavioral disturbance ([place]) 04/24/2018  . Rapidly progressive dementia ([place]) 04/13/2018  . Subacute confusional state 10/14/2017    Past Surgical History:  Procedure Laterality Date  . acromioclavicular separation  07/04/2016  . CHEST TUBE INSERTION Left 08/05/2018  . EYE SURGERY    . HERNIA REPAIR    . LEAD REVISION/REPAIR N/A 08/10/2018   Procedure: LEAD REVISION/REPAIR;  Surgeon: Deboraha Sprang,  MD;  Location: [place] [facility];  Service: Cardiovascular;  Laterality: N/A;  . NM MYOVIEW LTD    . PACEMAKER IMPLANT N/A 08/05/2018   Procedure: St Jude dual lead PACEMAKER IMPLANT;  Surgeon: Sanda Klein, MD;  Location: [place] [facility];  Service: Cardiovascular;  Laterality: N/A;  . Right ingunial hernia repair    . US ECHOCARDIOGRAPHY         Family History  Problem Relation Age of Onset  . Hypertension Mother   . Lung cancer Mother   . Diabetes Mother   . Thyroid disease Mother   . Angina Father   . Heart attack Father   . Heart disease Brother   . Diabetes Brother   . Heart attack Brother   . Asthma Maternal Grandmother   . Stroke Maternal Grandfather   . Diabetes Paternal Grandfather   . Heart Problems Brother   . Dementia Neg Hx     Social History   Tobacco Use  . Smoking status: Never Smoker  . Smokeless tobacco: Never Used  Substance Use Topics  . Alcohol use: No  . Drug use: No    Home Medications Prior to Admission medications   Medication Sig Start Date End Date Taking? Authorizing Provider  acetaminophen (TYLENOL) 325 MG tablet Take 650 mg by mouth every 6 (six) hours as needed for mild pain.   Yes [provider]  amLODipine (NORVASC) 2.5 MG tablet Take 2.5 mg by mouth daily. 05/01/16  Yes [provider]  aspirin EC 81 MG tablet Take 81 mg by mouth daily.   Yes [provider]  carbidopa-levodopa (SINEMET IR) 25-100 MG tablet Take 1 tablet by mouth 3 (three) times daily. (0830, 1330, & 2030) 06/12/18  Yes [provider]  donepezil (ARICEPT) 10 MG tablet Take 10 mg by mouth daily.  04/27/18  Yes [provider]  escitalopram (LEXAPRO) 10 MG tablet TAKE 1 TABLET BY MOUTH AT BEDTIME 09/13/19  Yes Reed, Tiffany L, DO  L-Methylfolate-B12-B6-B2 (CEREFOLIN) 02-28-49-5 MG TABS TAKE 1 TABLET BY MOUTH EVERY DAY 08/25/19  Yes Reed, Tiffany L, DO  Melatonin-Pyridoxine (MELATIN PO) Take 3 mg by mouth at bedtime.   Yes  [provider]  Multiple Vitamin (MULTIVITAMIN WITH MINERALS) TABS tablet Take 1 tablet by mouth daily.   Yes [provider]    Allergies    Propofol  Review of Systems   Review of Systems  All other systems reviewed and are negative.   Physical Exam Updated Vital Signs BP (!) 187/83   Pulse 79   Temp 97.9 F (36.6 C) (Oral)   Resp 20   Ht 5' 9.5" (1.765 m)   Wt 49.9 kg   SpO2 100%   BMI 16.01 kg/m   Physical Exam Vitals and nursing note reviewed.  Constitutional:      General: He is not in acute distress.    Appearance: He is well-developed. He is not ill-appearing, toxic-appearing or diaphoretic.  HENT:     Head: Normocephalic.     Comments: Right lateral periorbital contusion with large laceration that is irregular.    Right Ear: External ear normal.     Left Ear: External ear normal.     Nose: No rhinorrhea.     Mouth/Throat:     Mouth: Mucous membranes are moist.     Pharynx: No oropharyngeal exudate.  Eyes:     General:        Right eye: No discharge.        Left eye: No discharge.     Conjunctiva/sclera: Conjunctivae normal.     Pupils: Pupils are equal, round, and reactive to light.  Neck:     Trachea: Phonation normal.  Cardiovascular:     Rate and Rhythm: Normal rate and regular rhythm.     Heart sounds: Normal heart sounds.  Pulmonary:     Effort: Pulmonary effort is normal.     Breath sounds: Normal breath sounds.  Chest:     Chest wall: No tenderness.  Abdominal:     Palpations: Abdomen is soft.     Tenderness: There is no abdominal tenderness.  Musculoskeletal:        General: No swelling or tenderness. Normal range of motion.     Cervical back: Normal range of motion and neck supple.  Skin:    General: Skin is warm and dry.  Neurological:     Mental Status: He is alert.     Cranial Nerves: No cranial nerve deficit.     Sensory: No sensory deficit.     Motor: No abnormal muscle tone.     Coordination: Coordination  normal.  Psychiatric:        Mood and Affect: Mood normal.        Behavior: Behavior normal.     ED Results / Procedures / Treatments   Labs (all labs ordered are listed,  but only abnormal results are displayed) Labs Reviewed  BASIC METABOLIC PANEL - Abnormal; Notable for the following components:      Result Value   Chloride 97 (*)    Glucose, Bld 136 (*)    All other components within normal limits  URINALYSIS, ROUTINE W REFLEX MICROSCOPIC - Abnormal; Notable for the following components:   Ketones, ur 5 (*)    All other components within normal limits  CBC WITH DIFFERENTIAL/PLATELET    EKG EKG Interpretation  Date/Time:  Sunday November 07 2019 17:17:15 EST Ventricular Rate:  74 PR Interval:    QRS Duration: 88 QT Interval:  461 QTC Calculation: 512 R Axis:   77 Text Interpretation: Sinus rhythm Left ventricular hypertrophy Prolonged QT interval Since last tracing ST abnormality has resolved QT has lengthened Otherwise no significant change Confirmed by Daleen Bo [phone]) on 11/07/2019 5:26:17 PM   Radiology CT Head Wo Contrast  Result Date: 11/07/2019 CLINICAL DATA:  Unwitnessed fall EXAM: CT HEAD WITHOUT CONTRAST TECHNIQUE: Contiguous axial images were obtained from the base of the skull through the vertex without intravenous contrast. COMPARISON:  07/04/2016 FINDINGS: Brain: No acute intracranial abnormality. Specifically, no hemorrhage, hydrocephalus, mass lesion, acute infarction, or significant intracranial injury. Vascular: No hyperdense vessel or unexpected calcification. Skull: No acute calvarial abnormality. Sinuses/Orbits: Mucosal thickening. No air-fluid levels. Orbital soft tissues unremarkable. Other: None IMPRESSION: No acute intracranial abnormality. Electronically Signed   By: Rolm Baptise M.D.   On: 11/07/2019 18:00   CT Cervical Spine Wo Contrast  Result Date: 11/07/2019 CLINICAL DATA:  Unwitnessed fall EXAM: CT CERVICAL SPINE WITHOUT CONTRAST  TECHNIQUE: Multidetector CT imaging of the cervical spine was performed without intravenous contrast. Multiplanar CT image reconstructions were also generated. COMPARISON:  None. FINDINGS: Alignment: No subluxation. Skull base and vertebrae: No acute fracture. No primary bone lesion or focal pathologic process. Soft tissues and spinal canal: No prevertebral fluid or swelling. No visible canal hematoma. Disc levels:  Degenerative disc and facet disease diffusely. Upper chest: Biapical scarring. Other: None IMPRESSION: No acute bony abnormality. Electronically Signed   By: Rolm Baptise M.D.   On: 11/07/2019 18:02   DG Hand Complete Left  Result Date: 11/07/2019 CLINICAL DATA:  Fall.  Left hand pain EXAM: LEFT HAND - COMPLETE 3+ VIEW COMPARISON:  None. FINDINGS: No acute bony abnormality. Specifically, no fracture, subluxation, or dislocation. Joint spaces maintained. No bony erosions. Soft tissues intact. IMPRESSION: No acute bony abnormality. Electronically Signed   By: Rolm Baptise M.D.   On: 11/07/2019 18:03    Procedures .Marland KitchenLaceration Repair  Date/Time: 11/07/2019 9:58 PM Performed by: Daleen Bo, MD Authorized by: Daleen Bo, MD   Consent:    Consent obtained:  Verbal   Consent given by:  Spouse   Risks discussed:  Infection, pain, poor cosmetic result and poor wound healing   Alternatives discussed:  No treatment Anesthesia (see MAR for exact dosages):    Anesthesia method:  Local infiltration   Local anesthetic:  Lidocaine 1% WITH epi Laceration details:    Location:  Face   Face location:  R eyebrow   Length (cm):  3.5   Depth (mm):  9 Repair type:    Repair type:  Intermediate Pre-procedure details:    Preparation:  Patient was prepped and draped in usual sterile fashion and imaging obtained to evaluate for foreign bodies Exploration:    Hemostasis achieved with:  Direct pressure   Wound exploration: wound explored through full range of motion  Wound extent: no fascia  violation noted, no foreign bodies/material noted, no nerve damage noted and no underlying fracture noted     Contaminated: no   Treatment:    Area cleansed with:  Betadine   Amount of cleaning:  Standard   Irrigation solution:  Sterile saline   Irrigation method:  Syringe   Visualized foreign bodies/material removed: no   Skin repair:    Repair method:  Sutures   Suture size:  5-0   Suture material:  Prolene   Suture technique:  Simple interrupted   Number of sutures:  5 Approximation:    Approximation:  Loose Post-procedure details:    Dressing:  Non-adherent dressing and bulky dressing   Patient tolerance of procedure:  Tolerated well, no immediate complications .Critical Care Performed by: Daleen Bo, MD Authorized by: Daleen Bo, MD   Critical care provider statement:    Critical care time (minutes):  45   Critical care start time:  11/07/2019 5:20 PM   Critical care end time:  11/07/2019 10:03 PM   Critical care time was exclusive of:  Separately billable procedures and treating other patients   Critical care was time spent personally by me on the following activities:  Blood draw for specimens, development of treatment plan with patient or surrogate, discussions with consultants, evaluation of patient's response to treatment, examination of patient, obtaining history from patient or surrogate, ordering and performing treatments and interventions, ordering and review of laboratory studies, pulse oximetry, re-evaluation of patient's condition, review of old charts and ordering and review of radiographic studies   (including critical care time)  Medications Ordered in ED Medications  Tdap (BOOSTRIX) injection 0.5 mL (0.5 mLs Intramuscular Given 11/07/19 1802)  lidocaine-EPINEPHrine (XYLOCAINE W/EPI) 2 %-1:200000 (PF) injection 20 mL (20 mLs Infiltration Given 11/07/19 2151)  povidone-iodine (BETADINE) 10 % external solution (1 application  Given [id number])    ED Course    I have reviewed the triage vital signs and the nursing notes.  Pertinent labs & imaging results that were available during my care of the patient were reviewed by me and considered in my medical decision making (see chart for details).  Clinical Course as of Nov 06 2201  Sun Nov 07, 2019  1746 Cardiac pacemaker was assessed and there is no sign of any event today correlating with his fall.   [EW]    Clinical Course User Index [EW] Daleen Bo, MD   MDM Rules/Calculators/A&P                       Patient Vitals for the past 24 hrs:  BP Temp Temp src Pulse Resp SpO2 Height Weight  11/07/19 2000 (!) 187/83 -- -- -- 20 -- -- --  11/07/19 1930 (!) 147/62 -- -- -- 12 -- -- --  11/07/19 1900 137/71 -- -- -- 14 -- -- --  11/07/19 1830 (!) 147/80 -- -- 79 18 100 % -- --  11/07/19 1815 (!) 146/91 -- -- 85 14 100 % -- --  11/07/19 1730 (!) 155/83 -- -- -- -- -- -- --  11/07/19 1719 -- -- -- -- -- -- 5' 9.5" (1.765 m) 49.9 kg  11/07/19 1716 (!) 145/83 97.9 F (36.6 C) Oral 70 18 100 % -- --    9:59 PM Reevaluation with update and discussion. After initial assessment and treatment, an updated evaluation reveals he remains alert and communicative with his wife, findings discussed and all questions answered. Daleen Bo  Medical Decision Making: Fall likely mechanical, screening for medical illness, is reassuring for lack of abnormalities.  Imaging does not indicate serious injuries.  Doubt CVA, spinal injury, fracture, serious bacterial infection or metabolic instability.  Charles [name] was evaluated in Emergency Department on 11/07/2019 for the symptoms described in the history of present illness. He was evaluated in the context of the global COVID-19 pandemic, which necessitated consideration that the patient might be at risk for infection with the SARS-CoV-2 virus that causes COVID-19. Institutional protocols and algorithms that pertain to the evaluation of patients at risk for  COVID-19 are in a state of rapid change based on information released by regulatory bodies including the CDC and federal and state organizations. These policies and algorithms were followed during the patient's care in the ED.  CRITICAL CARE-no Performed by: Daleen Bo   Nursing Notes Reviewed/ Care Coordinated Applicable Imaging Reviewed Interpretation of Laboratory Data incorporated into ED treatment  The patient appears reasonably screened and/or stabilized for discharge and I doubt any other medical condition or other [facility] requiring further screening, evaluation, or treatment in the ED at this time prior to discharge.  Plan: Home Medications-continue usual, take Tylenol for pain; Home Treatments-wound care at home; return here if the recommended treatment, does not improve the symptoms; Recommended follow up-suture removal 7 to 8 days.   Final Clinical Impression(s) / ED Diagnoses Final diagnoses:  Injury of head, initial encounter  Fall, initial encounter  Facial laceration, initial encounter  Prolonged Q-T interval on ECG    Rx / DC Orders ED Discharge Orders    None       Daleen Bo, MD 11/07/19 2204

## 2019-11-07 NOTE — ED Notes (Signed)
EDP at bedside  

## 2019-11-09 ENCOUNTER — Ambulatory Visit (INDEPENDENT_AMBULATORY_CARE_PROVIDER_SITE_OTHER): Payer: Medicare Other | Admitting: Cardiovascular Disease

## 2019-11-09 ENCOUNTER — Ambulatory Visit (INDEPENDENT_AMBULATORY_CARE_PROVIDER_SITE_OTHER): Payer: Medicare Other | Admitting: *Deleted

## 2019-11-09 ENCOUNTER — Other Ambulatory Visit: Payer: Self-pay

## 2019-11-09 ENCOUNTER — Encounter: Payer: Self-pay | Admitting: Cardiovascular Disease

## 2019-11-09 VITALS — BP 130/84 | HR 60 | Ht 69.5 in | Wt 111.0 lb

## 2019-11-09 DIAGNOSIS — E43 Unspecified severe protein-calorie malnutrition: Secondary | ICD-10-CM

## 2019-11-09 DIAGNOSIS — Z950 Presence of cardiac pacemaker: Secondary | ICD-10-CM | POA: Diagnosis not present

## 2019-11-09 DIAGNOSIS — I495 Sick sinus syndrome: Secondary | ICD-10-CM | POA: Diagnosis not present

## 2019-11-09 DIAGNOSIS — I10 Essential (primary) hypertension: Secondary | ICD-10-CM

## 2019-11-09 DIAGNOSIS — W19XXXD Unspecified fall, subsequent encounter: Secondary | ICD-10-CM

## 2019-11-09 NOTE — Patient Instructions (Addendum)
Medication Instructions:  No changes *If you need a refill on your cardiac medications before your next appointment, please call your pharmacy*  Lab Work: None ordered If you have labs (blood work) drawn today and your tests are completely normal, you will receive your results only by: . MyChart Message (if you have MyChart) OR . A paper copy in the mail If you have any lab test that is abnormal or we need to change your treatment, we will call you to review the results.  Testing/Procedures: None ordered  Follow-Up: At [facility], you and your health needs are our priority.  As part of our continuing mission to provide you with exceptional heart care, we have created designated Provider Care Teams.  These Care Teams include your primary Cardiologist (physician) and Advanced Practice Providers (APPs -  Physician Assistants and Nurse Practitioners) who all work together to provide you with the care you need, when you need it.  Your next appointment:   6 month(s)  The format for your next appointment:   In Person  Provider:   Mihai Croitoru, MD    

## 2019-11-09 NOTE — Progress Notes (Signed)
Cardiology Office Note:    Date:  11/09/2019   ID:  Charles [name], DOB [birth year], MRN [id number]  PCP:  Gayland Curry, DO  Cardiologist:  New Electrophysiologist:  None   Referring MD: Gayland Curry, DO   Chief Complaint  Patient presents with  . Fall     History of Present Illness:    Charles [name] is a 79 y.o. male with a hx of rapidly progressive dementia, hypertension, history of diastolic dysfunction and aortic insufficiency, Parkinson's and sinus node dysfunction with symptomatic bradycardia.  Fraser fell 2 days ago.  He went out to the end of the driveway with a trash can when his wife was not paying attention.  He fell and hit his head against the pavement.  By the time she saw him he was getting up by himself.  He could not recall why he had fallen.  He is not sure whether he lost consciousness, tripped or simply lost his balance.  Mobility is poor due to Parkinson's disease.  He had a 3 cm deep gash in his forehead and required several stitches.  He did not have evidence of intracranial hemorrhage.  He has a periorbital ecchymosis but is otherwise doing well now.  He denies headaches or new focal neurological complaints.  He does have a habit of changing position quickly, especially when he gets out of bed in the morning.  His wife (Tana Conch is also my patient) encourages him to slow down when he changes positions.  The patient specifically denies any chest pain at rest exertion, dyspnea at rest or with exertion, orthopnea, paroxysmal nocturnal dyspnea, syncope, palpitations, focal neurological deficits, intermittent claudication, lower extremity edema, unexplained weight gain, cough, hemoptysis or wheezing.  He continues to eat only small amounts and is clearly underweight with a BMI of about 16.  He does not appear to be continually losing weight.  Pacemaker interrogation today shows normal device function.  He is not pacemaker dependent.  He has roughly 80% atrial  pacing but does not require ventricular pacing.  There have been no episodes of high ventricular rates or atrial fibrillation.  The last few months he has had only 5 episodes of atrial mode switch, all of them 4-8 seconds long, all of them probably represent ectopic atrial tachycardia.  Most recent episode occurred in December and there was no arrhythmia around the time of his recent injury.  He underwent implantation of a dual-chamber pacemaker (St Jude Assurity) in November [id number], complicated by lead perforation with pericarditis and pneumothorax.  However, he has recovered well from that procedure.  He seems to be more active and more interactive according to his family ever since the pacemaker was implanted.     Past Medical History:  Diagnosis Date  . AC joint separation    Right shoulder  . Acute pericarditis 08/09/2018   Admitted 11/19 for pericarditis; ? Related to recent pacer implant; small eff on echo/no tamponade  . Anxiety   . Aortic regurgitation   . Complication of anesthesia   . Dementia, Lewy body with behavior disturbance ([place])   . Depression   . Diastolic dysfunction   . Hypertension   . Left leg pain   . Microcytic anemia   . 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 SSS (sick sinus syndrome) ([place])     Past Surgical History:  Procedure Laterality Date  . acromioclavicular separation  07/04/2016  . CHEST TUBE  INSERTION Left 08/05/2018  . EYE SURGERY    . HERNIA REPAIR    . LEAD REVISION/REPAIR N/A 08/10/2018   Procedure: LEAD REVISION/REPAIR;  Surgeon: Deboraha Sprang, MD;  Location: [facility];  Service: Cardiovascular;  Laterality: N/A;  . NM MYOVIEW LTD    . PACEMAKER IMPLANT N/A 08/05/2018   Procedure: St Jude dual lead PACEMAKER IMPLANT;  Surgeon: Sanda Klein, MD;  Location: [place] [facility];  Service: Cardiovascular;  Laterality: N/A;  . Right ingunial hernia repair    . US ECHOCARDIOGRAPHY       Current Medications: Current Meds  Medication Sig  . acetaminophen (TYLENOL) 325 MG tablet Take 650 mg by mouth every 6 (six) hours as needed for mild pain.  Marland Kitchen amLODipine (NORVASC) 2.5 MG tablet Take 2.5 mg by mouth daily.  Marland Kitchen aspirin EC 81 MG tablet Take 81 mg by mouth daily.  . carbidopa-levodopa (SINEMET IR) 25-100 MG tablet Take 1 tablet by mouth 3 (three) times daily. (0830, 1330, & 2030)  . donepezil (ARICEPT) 10 MG tablet Take 10 mg by mouth daily.   Marland Kitchen escitalopram (LEXAPRO) 10 MG tablet TAKE 1 TABLET BY MOUTH AT BEDTIME  . L-Methylfolate-B12-B6-B2 (CEREFOLIN) 02-28-49-5 MG TABS TAKE 1 TABLET BY MOUTH EVERY DAY  . Melatonin-Pyridoxine (MELATIN PO) Take 3 mg by mouth at bedtime.  . Multiple Vitamin (MULTIVITAMIN WITH MINERALS) TABS tablet Take 1 tablet by mouth daily.     Allergies:   Propofol   Social History   Socioeconomic History  . Marital status: Married    Spouse name: Not on file  . Number of children: 3  . Years of education: Not on file  . Highest education level: Associate degree: academic program  Occupational History  . Not on file  Tobacco Use  . Smoking status: Never Smoker  . Smokeless tobacco: Never Used  Substance and Sexual Activity  . Alcohol use: No  . Drug use: No  . Sexual activity: Not on file  Other Topics Concern  . Not on file  Social History Narrative   Lives at home with his wife   Right handed   No caffeine    Social Determinants of Health   Financial Resource Strain:   . Difficulty of Paying Living Expenses: Not on file  Food Insecurity:   . Worried About Charity fundraiser in the Last Year: Not on file  . Ran Out of Food in the Last Year: Not on file  Transportation Needs:   . Lack of Transportation (Medical): Not on file  . Lack of Transportation (Non-Medical): Not on file  Physical Activity:   . Days of Exercise per Week: Not on file  . Minutes of Exercise per Session: Not on file  Stress:   . Feeling of Stress : Not on  file  Social Connections:   . Frequency of Communication with Friends and Family: Not on file  . Frequency of Social Gatherings with Friends and Family: Not on file  . Attends Religious Services: Not on file  . Active Member of Clubs or Organizations: Not on file  . Attends Archivist Meetings: Not on file  . Marital Status: Not on file     Family History: The patient's family history includes Angina in his father; Asthma in his maternal grandmother; Diabetes in his brother, mother, and paternal grandfather; Heart Problems in his brother; Heart attack in his brother and father; Heart disease in his brother; Hypertension in his mother; Lung  cancer in his mother; Stroke in his maternal grandfather; Thyroid disease in his mother. There is no history of Dementia.  ROS:   Please see the history of present illness.    All other systems are reviewed and are negative.   EKGs/Labs/Other Studies Reviewed:    The following studies were reviewed today: November 06, 2018 ER visit  EKG:  EKG is ordered today.  It shows atrial paced, ventricular sensed rhythm.  His only voltage criteria for LVH (he is extremely slender), otherwise normal tracing.  Recent Labs: 02/24/2019: ALT 11; TSH 3.58 11/07/2019: BUN 21; Creatinine, Ser 0.97; Hemoglobin 13.7; Platelets 193; Potassium 4.3; Sodium 139  Recent Lipid Panel    Component Value Date/Time   CHOL 222 (H) 02/24/2019 0902   TRIG 88 02/24/2019 0902   HDL 118 02/24/2019 0902   CHOLHDL 1.9 02/24/2019 0902   LDLCALC 86 02/24/2019 0902    Physical Exam:    VS:  BP 130/84   Pulse 60   Ht 5' 9.5" (1.765 m)   Wt 111 lb (50.3 kg)   BMI 16.16 kg/m   116 lb RR 15  Wt Readings from Last 3 Encounters:  11/09/19 111 lb (50.3 kg)  11/07/19 110 lb (49.9 kg)  10/11/19 113 lb 12.8 oz (51.6 kg)      General: Alert, oriented x3, no distress, extremely lean/underweight, healthy left subclavian pacemaker site. Head: no evidence of trauma, PERRL,  EOMI, no exophtalmos or lid lag, no myxedema, no xanthelasma; normal ears, nose and oropharynx Neck: normal jugular venous pulsations and no hepatojugular reflux; brisk carotid pulses without delay and no carotid bruits Chest: clear to auscultation, no signs of consolidation by percussion or palpation, normal fremitus, symmetrical and full respiratory excursions Cardiovascular: normal position and quality of the apical impulse, regular rhythm, normal first and second heart sounds, no murmurs, rubs or gallops Abdomen: no tenderness or distention, no masses by palpation, no abnormal pulsatility or arterial bruits, normal bowel sounds, no hepatosplenomegaly Extremities: no clubbing, cyanosis or edema; 2+ radial, ulnar and brachial pulses bilaterally; 2+ right femoral, posterior tibial and dorsalis pedis pulses; 2+ left femoral, posterior tibial and dorsalis pedis pulses; no subclavian or femoral bruits Neurological: grossly nonfocal.  No real tremor, but typical slow motion of Parkinson's syndrome. Psych: Normal mood and affect   ASSESSMENT:    1. SSS (sick sinus syndrome) ([facility])    PLAN:    In order of problems listed above:  1. Fall: This was not related to arrhythmia.  Cannot exclude episode of orthostatic hypotension if he bends over and then try to raise back up.  It is also possible he had a mechanical fall related to the motor impairments of Lewy body dementia/Parkinson syndrome.   2. Sinus bradycardia/SSS: Heart rate histogram appears to show appropriate sensor settings. 3. PPM: Normal device function.  Plan remote downloads every 3 months and yearly office visits. 4. HTN: Unable to document orthostatic hypotension today, but it does not mean that this did not occur on the day of his fall.  At risk for orthostatic hypotension due to his neurological condition and the medication used to treat it as well as his severe underweight status. 5. Severe protein calorie malnutrition:, BMI only 16.   Encouraged him to eat larger amounts of calorie and protein rich foods.   Medication Adjustments/Labs and Tests Ordered: Current medicines are reviewed at length with the patient today.  Concerns regarding medicines are outlined above.  Orders Placed This Encounter  Procedures  .  EKG 12-Lead   No orders of the defined types were placed in this encounter.   Patient Instructions  Medication Instructions:  No changes *If you need a refill on your cardiac medications before your next appointment, please call your pharmacy*  Lab Work: None ordered If you have labs (blood work) drawn today and your tests are completely normal, you will receive your results only by: Marland Kitchen MyChart Message (if you have MyChart) OR . A paper copy in the mail If you have any lab test that is abnormal or we need to change your treatment, we will call you to review the results.  Testing/Procedures: None ordered  Follow-Up: At [facility], you and your health needs are our priority.  As part of our continuing mission to provide you with exceptional heart care, we have created designated Provider Care Teams.  These Care Teams include your primary Cardiologist (physician) and Advanced Practice Providers (APPs -  Physician Assistants and Nurse Practitioners) who all work together to provide you with the care you need, when you need it.  Your next appointment:   6 month(s)  The format for your next appointment:   In Person  Provider:   Sanda Klein, MD     Signed, Sanda Klein, MD  11/09/2019 5:37 PM    [place]

## 2019-11-10 LAB — CUP PACEART REMOTE DEVICE CHECK
Battery Remaining Longevity: 121 mo
Battery Remaining Percentage: 95 %
Battery Voltage: 3.01 V
Brady Statistic AP VP Percent: 1 %
Brady Statistic AP VS Percent: 81 %
Brady Statistic AS VP Percent: 1 %
Brady Statistic AS VS Percent: 18 %
Brady Statistic RA Percent Paced: 80 %
Brady Statistic RV Percent Paced: 1 %
Date Time Interrogation Session: 20210209020015
Implantable Lead Implant Date: 20191106
Implantable Lead Implant Date: 20191111
Implantable Lead Location: 753859
Implantable Lead Location: 753860
Implantable Lead Model: 1948
Implantable Pulse Generator Implant Date: 20191106
Lead Channel Impedance Value: 410 Ohm
Lead Channel Impedance Value: 650 Ohm
Lead Channel Pacing Threshold Amplitude: 0.5 V
Lead Channel Pacing Threshold Amplitude: 0.625 V
Lead Channel Pacing Threshold Pulse Width: 0.5 ms
Lead Channel Pacing Threshold Pulse Width: 0.5 ms
Lead Channel Sensing Intrinsic Amplitude: 2.4 mV
Lead Channel Sensing Intrinsic Amplitude: 4.2 mV
Lead Channel Setting Pacing Amplitude: 0.875
Lead Channel Setting Pacing Amplitude: 1.5 V
Lead Channel Setting Pacing Pulse Width: 0.5 ms
Lead Channel Setting Sensing Sensitivity: 0.5 mV
Pulse Gen Model: 2272
Pulse Gen Serial Number: 9083517

## 2019-11-10 NOTE — Progress Notes (Signed)
PPM Remote  

## 2019-11-11 ENCOUNTER — Other Ambulatory Visit: Payer: Self-pay | Admitting: Cardiovascular Disease

## 2019-11-11 ENCOUNTER — Ambulatory Visit (INDEPENDENT_AMBULATORY_CARE_PROVIDER_SITE_OTHER): Payer: Medicare Other | Admitting: Internal Medicine

## 2019-11-11 ENCOUNTER — Encounter: Payer: Self-pay | Admitting: Internal Medicine

## 2019-11-11 ENCOUNTER — Other Ambulatory Visit: Payer: Self-pay

## 2019-11-11 VITALS — BP 122/62 | HR 60 | Temp 97.8°F | Ht 69.0 in | Wt 113.0 lb

## 2019-11-11 DIAGNOSIS — I495 Sick sinus syndrome: Secondary | ICD-10-CM

## 2019-11-11 DIAGNOSIS — G3183 Dementia with Lewy bodies: Secondary | ICD-10-CM | POA: Diagnosis not present

## 2019-11-11 DIAGNOSIS — R2681 Unsteadiness on feet: Secondary | ICD-10-CM | POA: Insufficient documentation

## 2019-11-11 DIAGNOSIS — W19XXXA Unspecified fall, initial encounter: Secondary | ICD-10-CM | POA: Diagnosis not present

## 2019-11-11 DIAGNOSIS — F0280 Dementia in other diseases classified elsewhere without behavioral disturbance: Secondary | ICD-10-CM | POA: Diagnosis not present

## 2019-11-11 DIAGNOSIS — Z950 Presence of cardiac pacemaker: Secondary | ICD-10-CM

## 2019-11-11 DIAGNOSIS — R636 Underweight: Secondary | ICD-10-CM | POA: Diagnosis not present

## 2019-11-11 NOTE — Patient Instructions (Addendum)
Stand up slowly and change positions slowly. Pay attention to which foods cause loose bowel movements and more urgency and try to avoid those.  Try to eat higher calorie meals.    I referred you to to dietitian to help with this.  High-Protein and High-Calorie Diet Eating high-protein and high-calorie foods can help you to gain weight, heal after an injury, and recover after an illness or surgery. The specific amount of daily protein and calories you need depends on:  Your body weight.  The reason this diet is recommended for you. What is my plan? Generally, a high-protein, high-calorie diet involves:  Eating 250-500 extra calories each day.  Making sure that you get enough of your daily calories from protein. Ask your health care provider how many of your calories should come from protein. Talk with a health care provider, such as a diet and nutrition specialist (dietitian), about how much protein and how many calories you need each day. Follow the diet as directed by your health care provider. What are tips for following this plan?  Preparing meals  Add whole milk, half-and-half, or heavy cream to cereal, pudding, soup, or hot cocoa.  Add whole milk to instant breakfast drinks.  Add peanut butter to oatmeal or smoothies.  Add powdered milk to baked goods, smoothies, or milkshakes.  Add powdered milk, cream, or butter to mashed potatoes.  Add cheese to cooked vegetables.  Make whole-milk yogurt parfaits. Top them with granola, fruit, or nuts.  Add cottage cheese to your fruit.  Add avocado, cheese, or both to sandwiches or salads.  Add meat, poultry, or seafood to rice, pasta, casseroles, salads, and soups.  Use mayonnaise when making egg salad, chicken salad, or tuna salad.  Use peanut butter as a dip for vegetables or as a topping for pretzels, celery, or crackers.  Add beans to casseroles, dips, and spreads.  Add pureed beans to sauces and soups.  Replace  calorie-free drinks with calorie-containing drinks, such as milk and fruit juice.  Replace water with milk or heavy cream when making foods such as oatmeal, pudding, or cocoa. General instructions  Ask your health care provider if you should take a nutritional supplement.  Try to eat six small meals each day instead of three large meals.  Eat a balanced diet. In each meal, include one food that is high in protein.  Keep nutritious snacks available, such as nuts, trail mixes, dried fruit, and yogurt.  If you have kidney disease or diabetes, talk with your health care provider about how much protein is safe for you. Too much protein may put extra stress on your kidneys.  Drink your calories. Choose high-calorie drinks and have them after your meals. What high-protein foods should I eat?  Vegetables Soybeans. Peas. Grains Quinoa. Bulgur wheat. Meats and other proteins Beef, pork, and poultry. Fish and seafood. Eggs. Tofu. Textured vegetable protein (TVP). Peanut butter. Nuts and seeds. Dried beans. Protein powders. Dairy Whole milk. Whole-milk yogurt. Powdered milk. Cheese. Yahoo. Eggnog. Beverages High-protein supplement drinks. Soy milk. Other foods Protein bars. The items listed above may not be a complete list of high-protein foods and beverages. Contact a dietitian for more options. What high-calorie foods should I eat? Fruits Dried fruit. Fruit leather. Canned fruit in syrup. Fruit juice. Avocado. Vegetables Vegetables cooked in oil or butter. Fried potatoes. Grains Pasta. Quick breads. Muffins. Pancakes. Ready-to-eat cereal. Meats and other proteins Peanut butter. Nuts and seeds. Dairy Heavy cream. Whipped cream. Cream cheese. Sour  cream. Ice cream. Custard. Pudding. Beverages Meal-replacement beverages. Nutrition shakes. Fruit juice. Sugar-sweetened soft drinks. Seasonings and condiments Salad dressing. Mayonnaise. Alfredo sauce. Fruit preserves or jelly.  Honey. Syrup. Sweets and desserts Cake. Cookies. Pie. Pastries. Candy bars. Chocolate. Fats and oils Butter or margarine. Oil. Gravy. Other foods Meal-replacement bars. The items listed above may not be a complete list of high-calorie foods and beverages. Contact a dietitian for more options. Summary  A high-protein, high-calorie diet can help you gain weight or heal faster after an injury, illness, or surgery.  To increase your protein and calories, add ingredients such as whole milk, peanut butter, cheese, beans, meat, or seafood to meal items.  To get enough extra calories each day, include high-calorie foods and beverages at each meal.  Adding a high-calorie drink or shake can be an easy way to help you get enough calories each day. Talk with your healthcare provider or dietitian about the best options for you. This information is not intended to replace advice given to you by your health care provider. Make sure you discuss any questions you have with your health care provider. Document Revised: 08/29/2017 Document Reviewed: 07/29/2017 Elsevier Patient Education  2020 [employer].

## 2019-11-11 NOTE — Progress Notes (Signed)
Location:  [facility] clinic Provider:  Teagan Heidrick L. Mariea Clonts, D.O., C.M.D.  Goals of Care:  Advanced Directives 11/11/2019  Does Patient Have a Medical Advance Directive? -  Type of Advance Directive [place]  Does patient want to make changes to medical advance directive? No - Patient declined  Copy of [place] in Chart? -     Chief Complaint  Patient presents with  . Transitions Of Care    follow up from hospital visit 11/07/19, [place] at home    HPI: Patient is a 79 y.o. male with h/o lewy body dementia, orthostatic hypotension, parkinson's, sinus brady with pacemaker, htn, underweight seen today for f/u after ED visit 11/07/19 for fall at home when going outside to move the trash can.  He hit his head on the pavement and did not recall the events.  He was on even ground and had not even touched the trash can yet.  His wife found him not getting up on his own and he was taken via ems to the ED for a 3cm deep gash in his forehead that required several stitches.  CT did not show any intracranial hemorrhage, fortunately.  He has a black eye. He saw cardiology on 2/9 and pacemaker interrogation indicates not pacer dependent, no high ventricular rates or afib, a few episodes of ectopic atrial tachycardia, but no arrhythmia at the time of his fall.  Mr. Edley has been having frequent bms now and there's an urgency.  He says it depends on what he eats.  He'll be up several times at night and right after breakfast.  Stools are dark brown, not black, sometimes light.  No blood seen.  No different smells to bms.    He's doing the boost.  He eats what's put in front of him, but their daughter seems to think that they are not eating enough.  Of course, his wife has diabetes and has to be careful about her diet.    Past Medical History:  Diagnosis Date  . AC joint separation    Right shoulder  . Acute pericarditis 08/09/2018   Admitted 11/19 for pericarditis; ? Related  to recent pacer implant; small eff on echo/no tamponade  . Anxiety   . Aortic regurgitation   . Complication of anesthesia   . Dementia, Lewy body with behavior disturbance ([place])   . Depression   . Diastolic dysfunction   . Hypertension   . Left leg pain   . Microcytic anemia   . 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 SSS (sick sinus syndrome) ([place])     Past Surgical History:  Procedure Laterality Date  . acromioclavicular separation  07/04/2016  . CHEST TUBE INSERTION Left 08/05/2018  . EYE SURGERY    . HERNIA REPAIR    . LEAD REVISION/REPAIR N/A 08/10/2018   Procedure: LEAD REVISION/REPAIR;  Surgeon: Deboraha Sprang, MD;  Location: [facility];  Service: Cardiovascular;  Laterality: N/A;  . NM MYOVIEW LTD    . PACEMAKER IMPLANT N/A 08/05/2018   Procedure: St Jude dual lead PACEMAKER IMPLANT;  Surgeon: Sanda Klein, MD;  Location: [facility];  Service: Cardiovascular;  Laterality: N/A;  . Right ingunial hernia repair    . US ECHOCARDIOGRAPHY      Allergies  Allergen Reactions  . Propofol Anaphylaxis    Outpatient Encounter Medications as of 11/11/2019  Medication Sig  . acetaminophen (TYLENOL) 325  MG tablet Take 650 mg by mouth every 6 (six) hours as needed for mild pain.  Marland Kitchen amLODipine (NORVASC) 2.5 MG tablet Take 2.5 mg by mouth daily.  Marland Kitchen aspirin EC 81 MG tablet Take 81 mg by mouth daily.  . carbidopa-levodopa (SINEMET IR) 25-100 MG tablet Take 1 tablet by mouth 3 (three) times daily. (0830, 1330, & 2030)  . donepezil (ARICEPT) 10 MG tablet Take 10 mg by mouth daily.   Marland Kitchen escitalopram (LEXAPRO) 10 MG tablet TAKE 1 TABLET BY MOUTH AT BEDTIME  . L-Methylfolate-B12-B6-B2 (CEREFOLIN) 02-28-49-5 MG TABS TAKE 1 TABLET BY MOUTH EVERY DAY  . Melatonin-Pyridoxine (MELATIN PO) Take 3 mg by mouth at bedtime.  . Multiple Vitamin (MULTIVITAMIN WITH MINERALS) TABS tablet Take 1 tablet by mouth daily.   No  facility-administered encounter medications on file as of 11/11/2019.    Review of Systems:  Review of Systems  Constitutional: Negative for chills, fever and malaise/fatigue.       Underweight  HENT: Negative for congestion.   Eyes: Negative for blurred vision.  Respiratory: Negative for cough and shortness of breath.   Cardiovascular: Negative for chest pain, palpitations and leg swelling.  Gastrointestinal: Positive for diarrhea. Negative for abdominal pain, blood in stool, constipation and melena.       More frequent bms and sometimes loose  Genitourinary: Negative for dysuria.  Musculoskeletal: Positive for falls. Negative for back pain and joint pain.  Skin:       Multiple abrasions to hands, bruises to wrists, large laceration to right temple s/p sutures in ED  Neurological: Negative for dizziness and loss of consciousness.  Endo/Heme/Allergies: Bruises/bleeds easily.  Psychiatric/Behavioral: Positive for memory loss. Negative for depression. The patient is not nervous/anxious and does not have insomnia.     Health Maintenance  Topic Date Due  . PNA vac Low Risk Adult (2 of 2 - PPSV23) 08/09/2018  . TETANUS/TDAP  11/06/2029  . INFLUENZA VACCINE  Completed    Physical Exam: Vitals:   11/11/19 1536  BP: 122/62  Pulse: 60  Temp: 97.8 F (36.6 C)  TempSrc: Temporal  SpO2: 99%  Weight: 113 lb (51.3 kg)  Height: 5\' 9"  (1.753 m)   Body mass index is 16.69 kg/m. Physical Exam Vitals reviewed.  Constitutional:      General: He is not in acute distress.    Appearance: He is not toxic-appearing.     Comments: Frail male  HENT:     Right Ear: External ear normal.     Left Ear: External ear normal.     Nose: Nose normal.  Eyes:     Extraocular Movements: Extraocular movements intact.     Conjunctiva/sclera: Conjunctivae normal.     Pupils: Pupils are equal, round, and reactive to light.  Cardiovascular:     Rate and Rhythm: Normal rate and regular rhythm.    Pulmonary:     Effort: Pulmonary effort is normal.     Breath sounds: Normal breath sounds. No wheezing, rhonchi or rales.  Musculoskeletal:        General: Normal range of motion.     Right lower leg: No edema.     Left lower leg: No edema.  Skin:    Comments: Right temple just above right eye with laceration with several sutures, large ecchymosis surrounding eye, mild residual swelling remains; abrasions bilateral palms and ecchymoses of palms and wrists; abrasions of fingertips; also has laceration of right arm and abrasion of right knee  Neurological:  General: No focal deficit present.     Mental Status: He is alert.     Gait: Gait abnormal.     Comments: Masked facies; bradykinesia; swings right leg out when walking and sometimes stubs toe on that side, unsteady, not using any assistive device  Psychiatric:        Mood and Affect: Mood normal.     Labs reviewed: Basic Metabolic Panel: Recent Labs    02/24/19 0902 11/07/19 1738  NA 142 139  K 4.3 4.3  CL 99 97*  CO2 32 30  GLUCOSE 90 136*  BUN 15 21  CREATININE 1.14 0.97  CALCIUM 10.3 9.7  TSH 3.58  --    Liver Function Tests: Recent Labs    02/24/19 0902  AST 25  ALT 11  BILITOT 1.5*  PROT 7.3   No results for input(s): LIPASE, AMYLASE in the last 8760 hours. No results for input(s): AMMONIA in the last 8760 hours. CBC: Recent Labs    02/24/19 0902 11/07/19 1738  WBC 7.0 7.5  NEUTROABS 3,360 5.0  HGB 13.9 13.7  HCT 41.1 42.3  MCV 94.9 99.1  PLT 217 193   Lipid Panel: Recent Labs    02/24/19 0902  CHOL 222*  HDL 118  LDLCALC 86  TRIG 88  CHOLHDL 1.9   No results found for: HGBA1C  Procedures since last visit: CT Head Wo Contrast  Result Date: 11/07/2019 CLINICAL DATA:  Unwitnessed fall EXAM: CT HEAD WITHOUT CONTRAST TECHNIQUE: Contiguous axial images were obtained from the base of the skull through the vertex without intravenous contrast. COMPARISON:  07/04/2016 FINDINGS: Brain: No  acute intracranial abnormality. Specifically, no hemorrhage, hydrocephalus, mass lesion, acute infarction, or significant intracranial injury. Vascular: No hyperdense vessel or unexpected calcification. Skull: No acute calvarial abnormality. Sinuses/Orbits: Mucosal thickening. No air-fluid levels. Orbital soft tissues unremarkable. Other: None IMPRESSION: No acute intracranial abnormality. Electronically Signed   By: Rolm Baptise M.D.   On: 11/07/2019 18:00   CT Cervical Spine Wo Contrast  Result Date: 11/07/2019 CLINICAL DATA:  Unwitnessed fall EXAM: CT CERVICAL SPINE WITHOUT CONTRAST TECHNIQUE: Multidetector CT imaging of the cervical spine was performed without intravenous contrast. Multiplanar CT image reconstructions were also generated. COMPARISON:  None. FINDINGS: Alignment: No subluxation. Skull base and vertebrae: No acute fracture. No primary bone lesion or focal pathologic process. Soft tissues and spinal canal: No prevertebral fluid or swelling. No visible canal hematoma. Disc levels:  Degenerative disc and facet disease diffusely. Upper chest: Biapical scarring. Other: None IMPRESSION: No acute bony abnormality. Electronically Signed   By: Rolm Baptise M.D.   On: 11/07/2019 18:02   DG Hand Complete Left  Result Date: 11/07/2019 CLINICAL DATA:  Fall.  Left hand pain EXAM: LEFT HAND - COMPLETE 3+ VIEW COMPARISON:  None. FINDINGS: No acute bony abnormality. Specifically, no fracture, subluxation, or dislocation. Joint spaces maintained. No bony erosions. Soft tissues intact. IMPRESSION: No acute bony abnormality. Electronically Signed   By: Rolm Baptise M.D.   On: 11/07/2019 18:03   CUP PACEART REMOTE DEVICE CHECK  Result Date: 11/10/2019 Scheduled remote reviewed.  Normal device function.  Next remote 91 days- JBox, RN/CVRS Scheduled remote reviewed.  Normal device function.  Next remote 91 days- JBox, RN/CVRS   Assessment/Plan 1. Fall, initial encounter -no clear cause besides losing  balance -with encouragement, he accepted home health therapy   2. Lewy body dementia without behavioral disturbance ([place]) -cont sinemet as is -f/u with neurologist as planned -could have a component  of orthostasis that caused this, but did not happen with positional change  3. Underweight - has been an ongoing issue; discussed high protein, high calorie diet for him (challenging for his wife who has to prepare food for her too and she's diabetic) -he does eat what's put in front of him, but it sounds like portions may be small per their daughter - Amb ref to Medical Nutrition Therapy-MNT  4. Sick sinus syndrome ([facility]) -no explanation found for fall with pacemaker interrogation  5. Pacemaker -was working fine when checked at Dr. Lurline Del  6. Unsteady gait when walking -recommended use of walker now at home -home health pt/ot/rn ordered  Labs/tests ordered:  No new--reviewed ED labs Next appt:  11/18/19 for suture removal   Takoda Janowiak L. Frederick Klinger, D.O. [facility] [address] [place], [place] [address] Cell Phone (Mon-Fri 8am-5pm):  [phone] On Call:  [phone] & follow prompts after 5pm & weekends Office Phone:  [phone] Office Fax:  [phone]

## 2019-11-17 ENCOUNTER — Other Ambulatory Visit: Payer: Self-pay

## 2019-11-17 ENCOUNTER — Encounter: Payer: Self-pay | Admitting: Family

## 2019-11-17 ENCOUNTER — Ambulatory Visit (INDEPENDENT_AMBULATORY_CARE_PROVIDER_SITE_OTHER): Payer: Medicare Other | Admitting: Family

## 2019-11-17 VITALS — BP 130/60 | HR 60 | Temp 98.6°F | Ht 69.0 in | Wt 115.0 lb

## 2019-11-17 DIAGNOSIS — T148XXA Other injury of unspecified body region, initial encounter: Secondary | ICD-10-CM | POA: Diagnosis not present

## 2019-11-17 DIAGNOSIS — S80211D Abrasion, right knee, subsequent encounter: Secondary | ICD-10-CM

## 2019-11-17 DIAGNOSIS — Z4802 Encounter for removal of sutures: Secondary | ICD-10-CM

## 2019-11-17 DIAGNOSIS — S61411D Laceration without foreign body of right hand, subsequent encounter: Secondary | ICD-10-CM

## 2019-11-17 NOTE — Progress Notes (Signed)
Provider: Taquita Demby FNP-C  Gayland Curry, DO  Patient Care Team: Gayland Curry, DO as PCP - General (Geriatric Medicine) Sanda Klein, MD as PCP - Cardiology (Cardiology)  Extended Emergency Contact Information Primary Emergency Contact: Monical,[name] H Address: [place], [place] [address] Johnnette Litter of [place] Phone: [phone] Relation: Spouse Secondary Emergency Contact: Layne Benton Mobile Phone: [phone] Relation: Daughter  Code Status: Full Code  Goals of care: Advanced Directive information Advanced Directives 11/11/2019  Does Patient Have a Medical Advance Directive? -  Type of Advance Directive [place]  Does patient want to make changes to medical advance directive? No - Patient declined  Copy of [place] in Chart? -     Chief Complaint  Patient presents with  . Acute Visit    remove stitches    HPI:  Pt is a 79 y.o. male seen today for an acute visit for evaluation of remove stitches.on right eyebrow.He is here with wife.He sustained a fall episode 11/07/2019 was evaluated in the ED.Had x 5 stitches on right eyebrow. Also states has right forearm,left palm and right knee skin abrasion.wife has been changing bandages daily.she states no redeness or drainage.He denies any pain.   Past Medical History:  Diagnosis Date  . AC joint separation    Right shoulder  . Acute pericarditis 08/09/2018   Admitted 11/19 for pericarditis; ? Related to recent pacer implant; small eff on echo/no tamponade  . Anxiety   . Aortic regurgitation   . Complication of anesthesia   . Dementia, Lewy body with behavior disturbance ([place])   . Depression   . Diastolic dysfunction   . Hypertension   . Left leg pain   . Microcytic anemia   . 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 SSS (sick sinus syndrome) ([place])    Past Surgical History:  Procedure  Laterality Date  . acromioclavicular separation  07/04/2016  . CHEST TUBE INSERTION Left 08/05/2018  . EYE SURGERY    . HERNIA REPAIR    . LEAD REVISION/REPAIR N/A 08/10/2018   Procedure: LEAD REVISION/REPAIR;  Surgeon: Deboraha Sprang, MD;  Location: [facility];  Service: Cardiovascular;  Laterality: N/A;  . NM MYOVIEW LTD    . PACEMAKER IMPLANT N/A 08/05/2018   Procedure: St Jude dual lead PACEMAKER IMPLANT;  Surgeon: Sanda Klein, MD;  Location: [facility];  Service: Cardiovascular;  Laterality: N/A;  . Right ingunial hernia repair    . US ECHOCARDIOGRAPHY      Allergies  Allergen Reactions  . Propofol Anaphylaxis    Outpatient Encounter Medications as of 11/17/2019  Medication Sig  . acetaminophen (TYLENOL) 325 MG tablet Take 650 mg by mouth every 6 (six) hours as needed for mild pain.  Marland Kitchen amLODipine (NORVASC) 2.5 MG tablet Take 2.5 mg by mouth daily.  Marland Kitchen aspirin EC 81 MG tablet Take 81 mg by mouth daily.  . carbidopa-levodopa (SINEMET IR) 25-100 MG tablet Take 1 tablet by mouth 3 (three) times daily. (0830, 1330, & 2030)  . donepezil (ARICEPT) 10 MG tablet Take 10 mg by mouth daily.   Marland Kitchen escitalopram (LEXAPRO) 10 MG tablet TAKE 1 TABLET BY MOUTH AT BEDTIME  . L-Methylfolate-B12-B6-B2 (CEREFOLIN) 02-28-49-5 MG TABS TAKE 1 TABLET BY MOUTH EVERY DAY  . Melatonin-Pyridoxine (MELATIN PO) Take 3 mg by mouth at bedtime.  . Multiple  Vitamin (MULTIVITAMIN WITH MINERALS) TABS tablet Take 1 tablet by mouth daily.   No facility-administered encounter medications on file as of 11/17/2019.    Review of Systems  Constitutional: Negative for appetite change, chills, fatigue and fever.  HENT: Negative for congestion, rhinorrhea, sinus pressure, sinus pain, sneezing and sore throat.   Eyes: Negative for pain, discharge, redness and itching.  Respiratory: Negative for cough, chest tightness, shortness of breath and wheezing.   Cardiovascular: Negative for chest pain, palpitations  and leg swelling.  Skin: Negative for color change, pallor and rash.       Right eyebrow stitches x 5.  Neurological: Negative for dizziness, speech difficulty, light-headedness and headaches.    Immunization History  Administered Date(s) Administered  . Fluad Quad(high Dose 65+) 05/20/2019  . Influenza-Unspecified 06/27/2011, 06/26/2012, 06/21/2013, 07/14/2014, 06/14/2015, 07/17/2016, 05/31/2017, 05/31/2018  . PFIZER SARS-COV-2 Vaccination 11/07/2019  . Pneumococcal Conjugate-13 08/09/2017  . Pneumococcal-Unspecified 03/22/2006  . Tdap 06/20/2009, 11/07/2019   Pertinent  Health Maintenance Due  Topic Date Due  . PNA vac Low Risk Adult (2 of 2 - PPSV23) 08/09/2018  . INFLUENZA VACCINE  Completed   Fall Risk  11/11/2019 10/11/2019 08/20/2019 05/20/2019 02/18/2019  Falls in the past year? 1 0 0 0 0  Number falls in past yr: 1 0 0 0 0  Injury with Fall? 1 0 0 0 0      Vitals:   11/17/19 1106  BP: 130/60  Pulse: 60  Temp: 98.6 F (37 C)  TempSrc: Oral  SpO2: 97%  Weight: 115 lb (52.2 kg)  Height: 5\' 9"  (1.753 m)   Body mass index is 16.98 kg/m. Physical Exam Vitals reviewed.  Constitutional:      General: He is not in acute distress.    Appearance: He is normal weight. He is not ill-appearing.  Cardiovascular:     Rate and Rhythm: Normal rate and regular rhythm.     Pulses: Normal pulses.     Heart sounds: Normal heart sounds. No murmur. No friction rub. No gallop.   Pulmonary:     Effort: Pulmonary effort is normal. No respiratory distress.     Breath sounds: Normal breath sounds. No wheezing, rhonchi or rales.  Chest:     Chest wall: No tenderness.  Abdominal:     General: Bowel sounds are normal. There is no distension.     Palpations: Abdomen is soft. There is no mass.     Tenderness: There is no abdominal tenderness. There is no right CVA tenderness, left CVA tenderness, guarding or rebound.  Skin:    General: Skin is warm and dry.     Coloration: Skin is not  pale.     Findings: No bruising or erythema.     Comments: 1. Right eyebrow/temple area x 5 stitches removed without any difficulties.no redness or drainage noted. 2. Right dorsal hand skin tear healed no signs of infection. 3. Left palm bruise and skin abraision healed. 4. Right knee abrasion pink in color no drainage noted.   Neurological:     Mental Status: He is alert.  Psychiatric:        Mood and Affect: Mood normal.        Behavior: Behavior normal.        Thought Content: Thought content normal.        Judgment: Judgment normal.    Labs reviewed: Recent Labs    02/24/19 0902 11/07/19 1738  NA 142 139  K 4.3 4.3  CL 99  97*  CO2 32 30  GLUCOSE 90 136*  BUN 15 21  CREATININE 1.14 0.97  CALCIUM 10.3 9.7   Recent Labs    02/24/19 0902  AST 25  ALT 11  BILITOT 1.5*  PROT 7.3   Recent Labs    02/24/19 0902 11/07/19 1738  WBC 7.0 7.5  NEUTROABS 3,360 5.0  HGB 13.9 13.7  HCT 41.1 42.3  MCV 94.9 99.1  PLT 217 193   Lab Results  Component Value Date   TSH 3.58 02/24/2019   No results found for: HGBA1C Lab Results  Component Value Date   CHOL 222 (H) 02/24/2019   HDL 118 02/24/2019   LDLCALC 86 02/24/2019   TRIG 88 02/24/2019   CHOLHDL 1.9 02/24/2019    Significant Diagnostic Results in last 30 days:  CT Head Wo Contrast  Result Date: 11/07/2019 CLINICAL DATA:  Unwitnessed fall EXAM: CT HEAD WITHOUT CONTRAST TECHNIQUE: Contiguous axial images were obtained from the base of the skull through the vertex without intravenous contrast. COMPARISON:  07/04/2016 FINDINGS: Brain: No acute intracranial abnormality. Specifically, no hemorrhage, hydrocephalus, mass lesion, acute infarction, or significant intracranial injury. Vascular: No hyperdense vessel or unexpected calcification. Skull: No acute calvarial abnormality. Sinuses/Orbits: Mucosal thickening. No air-fluid levels. Orbital soft tissues unremarkable. Other: None IMPRESSION: No acute intracranial  abnormality. Electronically Signed   By: Rolm Baptise M.D.   On: 11/07/2019 18:00   CT Cervical Spine Wo Contrast  Result Date: 11/07/2019 CLINICAL DATA:  Unwitnessed fall EXAM: CT CERVICAL SPINE WITHOUT CONTRAST TECHNIQUE: Multidetector CT imaging of the cervical spine was performed without intravenous contrast. Multiplanar CT image reconstructions were also generated. COMPARISON:  None. FINDINGS: Alignment: No subluxation. Skull base and vertebrae: No acute fracture. No primary bone lesion or focal pathologic process. Soft tissues and spinal canal: No prevertebral fluid or swelling. No visible canal hematoma. Disc levels:  Degenerative disc and facet disease diffusely. Upper chest: Biapical scarring. Other: None IMPRESSION: No acute bony abnormality. Electronically Signed   By: Rolm Baptise M.D.   On: 11/07/2019 18:02   DG Hand Complete Left  Result Date: 11/07/2019 CLINICAL DATA:  Fall.  Left hand pain EXAM: LEFT HAND - COMPLETE 3+ VIEW COMPARISON:  None. FINDINGS: No acute bony abnormality. Specifically, no fracture, subluxation, or dislocation. Joint spaces maintained. No bony erosions. Soft tissues intact. IMPRESSION: No acute bony abnormality. Electronically Signed   By: Rolm Baptise M.D.   On: 11/07/2019 18:03   CUP PACEART REMOTE DEVICE CHECK  Result Date: 11/10/2019 Scheduled remote reviewed.  Normal device function.  Next remote 91 days- JBox, RN/CVRS Scheduled remote reviewed.  Normal device function.  Next remote 91 days- JBox, RN/CVRS   Assessment/Plan 1. Encounter for removal of sutures Afebrile.right eyebrow x 5 stitches removed without any difficulties.laceration completely healed.No signs of infection. - Advised wife to notify provider for any redness,swelling or drainage. - Keep area open air.   2. Abrasion of right knee, subsequent encounter Wound bed pink in color.No drainage or tenderness  - continue to clean area and apply band aid daily for protection.  - Notify provider  for any signs of infection   3. Skin tear of right hand without complication, subsequent encounter Small areas on dorsal of hand progressive healing without any signs of infection.continue to cover with Band Aid dailyfor protection.  - Notify provider for any signs of infection   4. Bruise Left palm area purple bruise and abrasion progressive healing no tenderness or signs of infection.continue to protect  abrasion area with band aid daily.  Family/ staff Communication: Reviewed plan of care with patient and wife verbalized understanding.   Labs/tests ordered: None   Next Appointment: as scheduled with Dr.Reed 02/10/2020   Sandrea Hughs, NP

## 2019-11-17 NOTE — Patient Instructions (Signed)
-   Notify provider if you notice any fever,chills,redness,swelling or drainage from right eyebrow area. - Avoid scratching or rubbing skin right eyebrow areas.Leave to open air.

## 2019-11-18 ENCOUNTER — Ambulatory Visit: Payer: Medicare Other | Admitting: Internal Medicine

## 2019-11-21 DIAGNOSIS — I119 Hypertensive heart disease without heart failure: Secondary | ICD-10-CM | POA: Diagnosis not present

## 2019-11-21 DIAGNOSIS — G3183 Dementia with Lewy bodies: Secondary | ICD-10-CM | POA: Diagnosis not present

## 2019-11-21 DIAGNOSIS — Z7982 Long term (current) use of aspirin: Secondary | ICD-10-CM | POA: Diagnosis not present

## 2019-11-21 DIAGNOSIS — D539 Nutritional anemia, unspecified: Secondary | ICD-10-CM | POA: Diagnosis not present

## 2019-11-21 DIAGNOSIS — F0280 Dementia in other diseases classified elsewhere without behavioral disturbance: Secondary | ICD-10-CM | POA: Diagnosis not present

## 2019-11-21 DIAGNOSIS — F329 Major depressive disorder, single episode, unspecified: Secondary | ICD-10-CM

## 2019-11-21 DIAGNOSIS — F419 Anxiety disorder, unspecified: Secondary | ICD-10-CM

## 2019-11-21 DIAGNOSIS — G20 Parkinson's disease: Secondary | ICD-10-CM | POA: Diagnosis not present

## 2019-11-21 DIAGNOSIS — I951 Orthostatic hypotension: Secondary | ICD-10-CM | POA: Diagnosis not present

## 2019-11-21 DIAGNOSIS — R2681 Unsteadiness on feet: Secondary | ICD-10-CM | POA: Diagnosis not present

## 2019-11-21 DIAGNOSIS — Z950 Presence of cardiac pacemaker: Secondary | ICD-10-CM | POA: Diagnosis not present

## 2019-11-21 DIAGNOSIS — Z9181 History of falling: Secondary | ICD-10-CM | POA: Diagnosis not present

## 2019-11-22 ENCOUNTER — Telehealth: Payer: Self-pay | Admitting: *Deleted

## 2019-11-22 DIAGNOSIS — D539 Nutritional anemia, unspecified: Secondary | ICD-10-CM | POA: Diagnosis not present

## 2019-11-22 DIAGNOSIS — F0280 Dementia in other diseases classified elsewhere without behavioral disturbance: Secondary | ICD-10-CM | POA: Diagnosis not present

## 2019-11-22 DIAGNOSIS — G3183 Dementia with Lewy bodies: Secondary | ICD-10-CM | POA: Diagnosis not present

## 2019-11-22 DIAGNOSIS — I119 Hypertensive heart disease without heart failure: Secondary | ICD-10-CM | POA: Diagnosis not present

## 2019-11-22 DIAGNOSIS — I951 Orthostatic hypotension: Secondary | ICD-10-CM | POA: Diagnosis not present

## 2019-11-22 DIAGNOSIS — G20 Parkinson's disease: Secondary | ICD-10-CM | POA: Diagnosis not present

## 2019-11-22 NOTE — Telephone Encounter (Signed)
Ronalee Belts with [facility] called and wanted a verbal order for [place] and 2x3wks. Verbal order given.

## 2019-11-23 DIAGNOSIS — I951 Orthostatic hypotension: Secondary | ICD-10-CM | POA: Diagnosis not present

## 2019-11-23 DIAGNOSIS — G3183 Dementia with Lewy bodies: Secondary | ICD-10-CM | POA: Diagnosis not present

## 2019-11-23 DIAGNOSIS — I119 Hypertensive heart disease without heart failure: Secondary | ICD-10-CM | POA: Diagnosis not present

## 2019-11-23 DIAGNOSIS — F0280 Dementia in other diseases classified elsewhere without behavioral disturbance: Secondary | ICD-10-CM | POA: Diagnosis not present

## 2019-11-23 DIAGNOSIS — G20 Parkinson's disease: Secondary | ICD-10-CM | POA: Diagnosis not present

## 2019-11-23 DIAGNOSIS — D539 Nutritional anemia, unspecified: Secondary | ICD-10-CM | POA: Diagnosis not present

## 2019-11-26 DIAGNOSIS — G20 Parkinson's disease: Secondary | ICD-10-CM | POA: Diagnosis not present

## 2019-11-26 DIAGNOSIS — I951 Orthostatic hypotension: Secondary | ICD-10-CM | POA: Diagnosis not present

## 2019-11-26 DIAGNOSIS — I119 Hypertensive heart disease without heart failure: Secondary | ICD-10-CM | POA: Diagnosis not present

## 2019-11-26 DIAGNOSIS — G3183 Dementia with Lewy bodies: Secondary | ICD-10-CM | POA: Diagnosis not present

## 2019-11-26 DIAGNOSIS — D539 Nutritional anemia, unspecified: Secondary | ICD-10-CM | POA: Diagnosis not present

## 2019-11-26 DIAGNOSIS — F0280 Dementia in other diseases classified elsewhere without behavioral disturbance: Secondary | ICD-10-CM | POA: Diagnosis not present

## 2019-11-29 DIAGNOSIS — G20 Parkinson's disease: Secondary | ICD-10-CM | POA: Diagnosis not present

## 2019-11-29 DIAGNOSIS — D539 Nutritional anemia, unspecified: Secondary | ICD-10-CM | POA: Diagnosis not present

## 2019-11-29 DIAGNOSIS — G3183 Dementia with Lewy bodies: Secondary | ICD-10-CM | POA: Diagnosis not present

## 2019-11-29 DIAGNOSIS — I119 Hypertensive heart disease without heart failure: Secondary | ICD-10-CM | POA: Diagnosis not present

## 2019-11-29 DIAGNOSIS — I951 Orthostatic hypotension: Secondary | ICD-10-CM | POA: Diagnosis not present

## 2019-11-29 DIAGNOSIS — F0280 Dementia in other diseases classified elsewhere without behavioral disturbance: Secondary | ICD-10-CM | POA: Diagnosis not present

## 2019-11-30 DIAGNOSIS — F0281 Dementia in other diseases classified elsewhere with behavioral disturbance: Secondary | ICD-10-CM | POA: Diagnosis not present

## 2019-11-30 DIAGNOSIS — Z79899 Other long term (current) drug therapy: Secondary | ICD-10-CM | POA: Diagnosis not present

## 2019-11-30 DIAGNOSIS — G3183 Dementia with Lewy bodies: Secondary | ICD-10-CM | POA: Diagnosis not present

## 2019-12-01 DIAGNOSIS — I951 Orthostatic hypotension: Secondary | ICD-10-CM | POA: Diagnosis not present

## 2019-12-01 DIAGNOSIS — G3183 Dementia with Lewy bodies: Secondary | ICD-10-CM | POA: Diagnosis not present

## 2019-12-01 DIAGNOSIS — F0280 Dementia in other diseases classified elsewhere without behavioral disturbance: Secondary | ICD-10-CM | POA: Diagnosis not present

## 2019-12-01 DIAGNOSIS — D539 Nutritional anemia, unspecified: Secondary | ICD-10-CM | POA: Diagnosis not present

## 2019-12-01 DIAGNOSIS — I119 Hypertensive heart disease without heart failure: Secondary | ICD-10-CM | POA: Diagnosis not present

## 2019-12-01 DIAGNOSIS — G20 Parkinson's disease: Secondary | ICD-10-CM | POA: Diagnosis not present

## 2019-12-10 DIAGNOSIS — G20 Parkinson's disease: Secondary | ICD-10-CM | POA: Diagnosis not present

## 2019-12-10 DIAGNOSIS — G3183 Dementia with Lewy bodies: Secondary | ICD-10-CM | POA: Diagnosis not present

## 2019-12-10 DIAGNOSIS — D539 Nutritional anemia, unspecified: Secondary | ICD-10-CM | POA: Diagnosis not present

## 2019-12-10 DIAGNOSIS — I951 Orthostatic hypotension: Secondary | ICD-10-CM | POA: Diagnosis not present

## 2019-12-10 DIAGNOSIS — F0280 Dementia in other diseases classified elsewhere without behavioral disturbance: Secondary | ICD-10-CM | POA: Diagnosis not present

## 2019-12-10 DIAGNOSIS — I119 Hypertensive heart disease without heart failure: Secondary | ICD-10-CM | POA: Diagnosis not present

## 2019-12-13 DIAGNOSIS — G20 Parkinson's disease: Secondary | ICD-10-CM | POA: Diagnosis not present

## 2019-12-13 DIAGNOSIS — G3183 Dementia with Lewy bodies: Secondary | ICD-10-CM | POA: Diagnosis not present

## 2019-12-13 DIAGNOSIS — I951 Orthostatic hypotension: Secondary | ICD-10-CM | POA: Diagnosis not present

## 2019-12-13 DIAGNOSIS — I119 Hypertensive heart disease without heart failure: Secondary | ICD-10-CM | POA: Diagnosis not present

## 2019-12-13 DIAGNOSIS — F0280 Dementia in other diseases classified elsewhere without behavioral disturbance: Secondary | ICD-10-CM | POA: Diagnosis not present

## 2019-12-13 DIAGNOSIS — D539 Nutritional anemia, unspecified: Secondary | ICD-10-CM | POA: Diagnosis not present

## 2019-12-15 DIAGNOSIS — I119 Hypertensive heart disease without heart failure: Secondary | ICD-10-CM | POA: Diagnosis not present

## 2019-12-15 DIAGNOSIS — G20 Parkinson's disease: Secondary | ICD-10-CM | POA: Diagnosis not present

## 2019-12-15 DIAGNOSIS — D539 Nutritional anemia, unspecified: Secondary | ICD-10-CM | POA: Diagnosis not present

## 2019-12-15 DIAGNOSIS — G3183 Dementia with Lewy bodies: Secondary | ICD-10-CM | POA: Diagnosis not present

## 2019-12-15 DIAGNOSIS — I951 Orthostatic hypotension: Secondary | ICD-10-CM | POA: Diagnosis not present

## 2019-12-15 DIAGNOSIS — F0280 Dementia in other diseases classified elsewhere without behavioral disturbance: Secondary | ICD-10-CM | POA: Diagnosis not present

## 2019-12-16 ENCOUNTER — Telehealth: Payer: Self-pay

## 2019-12-16 NOTE — Telephone Encounter (Signed)
Incoming call received from patients daughter Mickel Baas stating she has some paperwork for Dr.Reed to complete that will remove the responsibility of patient pulling trash cans to the road which puts him and wife (also a patient of Dr.Reed's) at risk for falling.  Mickel Baas was informed she can submit paperwork by uploading to [employer], faxing, dropping off at our office, or mailing.  Mickel Baas asked for the fax number. Mickel Baas was informed that Dr.Reed will review paperwork, complete, and if an appointment is needed prior to completing paperwork we will call to scheduled. Mickel Baas verbalized understanding of process.

## 2020-01-04 ENCOUNTER — Ambulatory Visit: Payer: Medicare Other | Admitting: Nutrition

## 2020-01-12 ENCOUNTER — Ambulatory Visit: Payer: Medicare Other | Admitting: Nutrition

## 2020-01-20 DIAGNOSIS — Z7189 Other specified counseling: Secondary | ICD-10-CM | POA: Insufficient documentation

## 2020-01-20 DIAGNOSIS — R413 Other amnesia: Secondary | ICD-10-CM | POA: Insufficient documentation

## 2020-01-21 DIAGNOSIS — R413 Other amnesia: Secondary | ICD-10-CM | POA: Diagnosis not present

## 2020-01-21 DIAGNOSIS — Z7189 Other specified counseling: Secondary | ICD-10-CM | POA: Diagnosis not present

## 2020-01-21 DIAGNOSIS — G3183 Dementia with Lewy bodies: Secondary | ICD-10-CM | POA: Diagnosis not present

## 2020-01-21 DIAGNOSIS — F0281 Dementia in other diseases classified elsewhere with behavioral disturbance: Secondary | ICD-10-CM | POA: Diagnosis not present

## 2020-01-27 ENCOUNTER — Encounter: Payer: Medicare Other | Attending: Internal Medicine | Admitting: Nutrition

## 2020-01-27 ENCOUNTER — Other Ambulatory Visit: Payer: Self-pay

## 2020-01-27 ENCOUNTER — Encounter: Payer: Self-pay | Admitting: Nutrition

## 2020-01-27 VITALS — Ht 69.0 in | Wt 114.8 lb

## 2020-01-27 DIAGNOSIS — R634 Abnormal weight loss: Secondary | ICD-10-CM

## 2020-01-27 DIAGNOSIS — R636 Underweight: Secondary | ICD-10-CM | POA: Insufficient documentation

## 2020-01-27 DIAGNOSIS — F0280 Dementia in other diseases classified elsewhere without behavioral disturbance: Secondary | ICD-10-CM | POA: Insufficient documentation

## 2020-01-27 DIAGNOSIS — G3183 Dementia with Lewy bodies: Secondary | ICD-10-CM | POA: Insufficient documentation

## 2020-01-27 NOTE — Progress Notes (Signed)
  Medical Nutrition Therapy:  Appt start time: 1430 end time:  [id number].   Assessment:  Primary concerns today: Weigh loss, underweight, Lewy Body Dementia. Here with his wife. Has lost about 30 lbs in the last year or so. Fell 2 months ago and got stiches over his right eye. Is able to walk but unbalanced at times. Does not use a cane or walker. Wife cooks all meals. He has limited appetite at times. Has been drinking Boost 1-2 times per week. He has been very conscious of his weight in the past. Usually follows a lower fat diet to be heart healthy. No reported skin breakdown. Wt today 114. 8 lbs. Usual wt recently has been 113- 114 lbs. UBW 150's. Currently takes Carbidopa-levodapa for parkinson's, Aricept for his dementia and lexapro for depression.  Has trouble sleeping and takes melatin at night to help sleep along with remeron.    Wt Readings from Last 3 Encounters:  01/27/20 114 lb 12.8 oz (52.1 kg)  11/17/19 115 lb (52.2 kg)  11/11/19 113 lb (51.3 kg)   Ht Readings from Last 3 Encounters:  01/27/20 5\' 9"  (1.753 m)  11/17/19 5\' 9"  (1.753 m)  11/11/19 5\' 9"  (1.753 m)   Body mass index is 16.95 kg/m. @BMIFA @ Facility age limit for growth percentiles is 20 years. Facility age limit for growth percentiles is 20 years.  Preferred Learning Style:   No preference indicated   Learning Readiness:   Change in progress   MEDICATIONS:    DIETARY INTAKE:   24-hr recall:  B ( AM): eggs and oatmeal Snk ( AM):   L ( PM): 1/2 sandwich, fruit, water Snk ( PM):  D ( PM): chicken casserole, ice cream , water Snk ( PM): sometimes will drink a Boost  Beverages: water.  Usual physical activity:  ADL   Estimated energy needs: 2000  calories 225 g carbohydrates 150 g protein 56 g fat  Progress Towards Goal(s):  In progress.   Nutritional Diagnosis:  NB-1.1 Food and nutrition-related knowledge deficit As related to Underweight and weight loss.  As evidenced by BMI 16.     Intervention:  High Calorie High Protein diet. Healthy snacks. Goals Eat 5-6 small meals per day Add calories from butter, fats, PB, cheese and gravies. Drink 1 or more Boost per day May have a boost milkshake daily for added calories. Gain 2 lbs per month Change to 2% milk for more calories than skim milk.  Teaching Method Utilized:  Visual Auditory Hands on  Handouts given during visit include:  High Calorie High Protein Diet  Ways to increase calories   Barriers to learning/adherence to lifestyle change: none  Demonstrated degree of understanding via:  Teach Back   Monitoring/Evaluation:  Dietary intake, exercise,  and body weight in 1 month(s).

## 2020-01-31 ENCOUNTER — Encounter: Payer: Self-pay | Admitting: Nutrition

## 2020-01-31 NOTE — Patient Instructions (Signed)
Goals Eat 5-6 small meals per day Add calories from butter, fats, PB, cheese and gravies. Drink 1 or more Boost per day May have a boost milkshake daily for added calories. Gain 2 lbs per month Change to 2% milk for more calories than skim milk.

## 2020-02-07 ENCOUNTER — Other Ambulatory Visit: Payer: Medicare Other

## 2020-02-08 ENCOUNTER — Ambulatory Visit (INDEPENDENT_AMBULATORY_CARE_PROVIDER_SITE_OTHER): Payer: Medicare Other | Admitting: *Deleted

## 2020-02-08 DIAGNOSIS — I495 Sick sinus syndrome: Secondary | ICD-10-CM

## 2020-02-08 LAB — CUP PACEART REMOTE DEVICE CHECK
Battery Remaining Longevity: 128 mo
Battery Remaining Percentage: 95.5 %
Battery Voltage: 3.01 V
Brady Statistic AP VP Percent: 1 %
Brady Statistic AP VS Percent: 80 %
Brady Statistic AS VP Percent: 1 %
Brady Statistic AS VS Percent: 19 %
Brady Statistic RA Percent Paced: 80 %
Brady Statistic RV Percent Paced: 1 %
Date Time Interrogation Session: 20210511020013
Implantable Lead Implant Date: 20191106
Implantable Lead Implant Date: 20191111
Implantable Lead Location: 753859
Implantable Lead Location: 753860
Implantable Lead Model: 1948
Implantable Pulse Generator Implant Date: 20191106
Lead Channel Impedance Value: 390 Ohm
Lead Channel Impedance Value: 610 Ohm
Lead Channel Pacing Threshold Amplitude: 0.5 V
Lead Channel Pacing Threshold Amplitude: 0.625 V
Lead Channel Pacing Threshold Pulse Width: 0.5 ms
Lead Channel Pacing Threshold Pulse Width: 0.5 ms
Lead Channel Sensing Intrinsic Amplitude: 1.2 mV
Lead Channel Sensing Intrinsic Amplitude: 3.4 mV
Lead Channel Setting Pacing Amplitude: 0.875
Lead Channel Setting Pacing Amplitude: 1.5 V
Lead Channel Setting Pacing Pulse Width: 0.5 ms
Lead Channel Setting Sensing Sensitivity: 0.5 mV
Pulse Gen Model: 2272
Pulse Gen Serial Number: 9083517

## 2020-02-09 NOTE — Progress Notes (Signed)
Remote pacemaker transmission.   

## 2020-02-10 ENCOUNTER — Ambulatory Visit: Payer: Medicare Other | Admitting: Internal Medicine

## 2020-02-29 ENCOUNTER — Encounter: Payer: Medicare Other | Attending: Internal Medicine | Admitting: Nutrition

## 2020-02-29 ENCOUNTER — Encounter: Payer: Self-pay | Admitting: Nutrition

## 2020-02-29 ENCOUNTER — Other Ambulatory Visit: Payer: Self-pay

## 2020-02-29 VITALS — Ht 69.0 in | Wt 120.0 lb

## 2020-02-29 DIAGNOSIS — R636 Underweight: Secondary | ICD-10-CM

## 2020-02-29 DIAGNOSIS — Z713 Dietary counseling and surveillance: Secondary | ICD-10-CM | POA: Diagnosis not present

## 2020-02-29 DIAGNOSIS — Z681 Body mass index (BMI) 19 or less, adult: Secondary | ICD-10-CM

## 2020-02-29 DIAGNOSIS — R634 Abnormal weight loss: Secondary | ICD-10-CM | POA: Diagnosis not present

## 2020-02-29 NOTE — Patient Instructions (Addendum)
Goals Eat 5-6 small meals per day Add calories from butter, fats, PB, cheese and gravies. Drink 1 or more Boost per day May have a boost milkshake daily for added calories. Gain 1-2 lbs per month Change to 2% milk for more calories than skim milk.

## 2020-02-29 NOTE — Progress Notes (Signed)
  Medical Nutrition Therapy:  Appt start time: 1430 end time:  [id number].   Assessment:  Primary concerns today: Weigh loss, underweight, Lewy Body Dementia. Here with his wife. Gained 6 lbs. Eating better. Feels better. Drinking supplements to support his calories. Doing much better.    Gained from 114 to 120 lbs.   Preferred Learning Style:   No preference indicated   Learning Readiness:   Change in progress   MEDICATIONS:    DIETARY INTAKE:   24-hr recall:  B ( AM): waffles, milk and oatmeal,  Snk ( AM):   L ( PM): 1/2 sandwich ham sandwich, 1 fib bars.  Snk ( PM):  D ( PM): chicken casserole, ice cream , water Snk ( PM): sometimes will drink a Boost  Beverages: water.  Usual physical activity:  ADL   Estimated energy needs: 2000  calories 225 g carbohydrates 150 g protein 56 g fat  Progress Towards Goal(s):  In progress.   Nutritional Diagnosis:  NB-1.1 Food and nutrition-related knowledge deficit As related to Underweight and weight loss.  As evidenced by BMI 16.    Intervention:  High Calorie High Protein diet. Healthy snacks. Goals Eat 5-6 small meals per day Add calories from butter, fats, PB, cheese and gravies. Drink 1 or more Boost per day May have a boost milkshake daily for added calories. Gain 1-2 lbs per month Change to 2% milk for more calories than skim milk.  Teaching Method Utilized:  Visual Auditory Hands on  Handouts given during visit include:  High Calorie High Protein Diet  Ways to increase calories   Barriers to learning/adherence to lifestyle change: none  Demonstrated degree of understanding via:  Teach Back   Monitoring/Evaluation:  Dietary intake, exercise,  and body weight in 2-3 months.).

## 2020-03-01 ENCOUNTER — Other Ambulatory Visit: Payer: Self-pay | Admitting: Internal Medicine

## 2020-03-01 NOTE — Telephone Encounter (Signed)
rx sent to pharmacy by e-script  

## 2020-03-09 ENCOUNTER — Other Ambulatory Visit: Payer: Medicare Other

## 2020-03-14 DIAGNOSIS — Z79899 Other long term (current) drug therapy: Secondary | ICD-10-CM | POA: Diagnosis not present

## 2020-03-14 DIAGNOSIS — G3183 Dementia with Lewy bodies: Secondary | ICD-10-CM | POA: Diagnosis not present

## 2020-03-14 DIAGNOSIS — F0281 Dementia in other diseases classified elsewhere with behavioral disturbance: Secondary | ICD-10-CM | POA: Diagnosis not present

## 2020-03-14 DIAGNOSIS — Z888 Allergy status to other drugs, medicaments and biological substances status: Secondary | ICD-10-CM | POA: Diagnosis not present

## 2020-03-14 NOTE — Addendum Note (Signed)
Addended by: Logan Bores on: 03/14/2020 10:20 AM   Modules accepted: Orders

## 2020-03-16 ENCOUNTER — Other Ambulatory Visit: Payer: Self-pay

## 2020-03-16 ENCOUNTER — Other Ambulatory Visit: Payer: Medicare Other

## 2020-03-16 DIAGNOSIS — F0280 Dementia in other diseases classified elsewhere without behavioral disturbance: Secondary | ICD-10-CM

## 2020-03-16 DIAGNOSIS — G3183 Dementia with Lewy bodies: Secondary | ICD-10-CM | POA: Diagnosis not present

## 2020-03-16 LAB — CBC WITH DIFFERENTIAL/PLATELET
Absolute Monocytes: 979 cells/uL — ABNORMAL HIGH (ref 200–950)
Basophils Absolute: 113 cells/uL (ref 0–200)
Basophils Relative: 1.1 %
Eosinophils Absolute: 525 cells/uL — ABNORMAL HIGH (ref 15–500)
Eosinophils Relative: 5.1 %
HCT: 38 % — ABNORMAL LOW (ref 38.5–50.0)
Hemoglobin: 12.7 g/dL — ABNORMAL LOW (ref 13.2–17.1)
Lymphs Abs: 2060 cells/uL (ref 850–3900)
MCH: 30.8 pg (ref 27.0–33.0)
MCHC: 33.4 g/dL (ref 32.0–36.0)
MCV: 92.2 fL (ref 80.0–100.0)
MPV: 10.9 fL (ref 7.5–12.5)
Monocytes Relative: 9.5 %
Neutro Abs: 6623 cells/uL (ref 1500–7800)
Neutrophils Relative %: 64.3 %
Platelets: 227 10*3/uL (ref 140–400)
RBC: 4.12 10*6/uL — ABNORMAL LOW (ref 4.20–5.80)
RDW: 12.6 % (ref 11.0–15.0)
Total Lymphocyte: 20 %
WBC: 10.3 10*3/uL (ref 3.8–10.8)

## 2020-03-16 LAB — COMPLETE METABOLIC PANEL WITH GFR
AG Ratio: 1.7 (calc) (ref 1.0–2.5)
ALT: 13 U/L (ref 9–46)
AST: 22 U/L (ref 10–35)
Albumin: 4.1 g/dL (ref 3.6–5.1)
Alkaline phosphatase (APISO): 89 U/L (ref 35–144)
BUN: 14 mg/dL (ref 7–25)
CO2: 36 mmol/L — ABNORMAL HIGH (ref 20–32)
Calcium: 9.5 mg/dL (ref 8.6–10.3)
Chloride: 101 mmol/L (ref 98–110)
Creat: 1.02 mg/dL (ref 0.70–1.18)
GFR, Est African American: 81 mL/min/{1.73_m2} (ref >=60)
GFR, Est Non African American: 70 mL/min/{1.73_m2} (ref >=60)
Globulin: 2.4 g/dL (calc) (ref 1.9–3.7)
Glucose, Bld: 91 mg/dL (ref 65–99)
Potassium: 4.6 mmol/L (ref 3.5–5.3)
Sodium: 142 mmol/L (ref 135–146)
Total Bilirubin: 1 mg/dL (ref 0.2–1.2)
Total Protein: 6.5 g/dL (ref 6.1–8.1)

## 2020-03-17 NOTE — Progress Notes (Signed)
His mild anemia has returned.   Liver, kidneys and electrolytes look ok.   We'll review at his appt

## 2020-03-20 ENCOUNTER — Other Ambulatory Visit: Payer: Self-pay

## 2020-03-20 ENCOUNTER — Ambulatory Visit (INDEPENDENT_AMBULATORY_CARE_PROVIDER_SITE_OTHER): Payer: Medicare Other | Admitting: Internal Medicine

## 2020-03-20 ENCOUNTER — Encounter: Payer: Self-pay | Admitting: Internal Medicine

## 2020-03-20 VITALS — BP 122/70 | HR 60 | Temp 97.5°F | Ht 69.0 in | Wt 123.0 lb

## 2020-03-20 DIAGNOSIS — I5032 Chronic diastolic (congestive) heart failure: Secondary | ICD-10-CM

## 2020-03-20 DIAGNOSIS — F0280 Dementia in other diseases classified elsewhere without behavioral disturbance: Secondary | ICD-10-CM

## 2020-03-20 DIAGNOSIS — R2681 Unsteadiness on feet: Secondary | ICD-10-CM | POA: Diagnosis not present

## 2020-03-20 DIAGNOSIS — R636 Underweight: Secondary | ICD-10-CM | POA: Diagnosis not present

## 2020-03-20 DIAGNOSIS — D649 Anemia, unspecified: Secondary | ICD-10-CM

## 2020-03-20 DIAGNOSIS — G3183 Dementia with Lewy bodies: Secondary | ICD-10-CM | POA: Diagnosis not present

## 2020-03-20 NOTE — Progress Notes (Signed)
Location:  [facility] clinic Provider:  Katherina Wimer L. Mariea Clonts, D.O., C.M.D.  Code Status: full code--need to discuss Goals of Care:  Advanced Directives 03/20/2020  Does Patient Have a Medical Advance Directive? Yes  Type of Paramedic of [place];Living will  Does patient want to make changes to medical advance directive? -  Copy of [place] in Chart? No - copy requested   Chief Complaint  Patient presents with  . Medical Management of Chronic Issues    4 month follow up     HPI: Patient is a 79 y.o. male seen today for medical management of chronic diseases.    He's doing ok.  He's recovered from his fall.  He thinks he's about the same.  He is still struggling to keep himself from falling.  He went to the neurologist last week.  No meds changes were made.  No more falls.    Weight is trending up.  He went from 114.8 in April, then 120 early this month and 123 lbs today.  The nutritionist gave [name] some recipes to help him gain weight alog with ice cream.    Mild anemia is back.    He is worried what is his cholesterol, but I was not looking at that in hopes he'd eat more regularly.  Tana Conch thinks his overall outlook has improved and he seems to understand how important it is for him to stay active, eat and be engaged.  The home health therapy helped, also.    Past Medical History:  Diagnosis Date  . AC joint separation    Right shoulder  . Acute pericarditis 08/09/2018   Admitted 11/19 for pericarditis; ? Related to recent pacer implant; small eff on echo/no tamponade  . Anxiety   . Aortic regurgitation   . Complication of anesthesia   . Dementia, Lewy body with behavior disturbance ([place])   . Depression   . Diastolic dysfunction   . Hypertension   . Left leg pain   . Microcytic anemia   . 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 SSS (sick sinus syndrome) ([place])     Past Surgical History:   Procedure Laterality Date  . acromioclavicular separation  07/04/2016  . CHEST TUBE INSERTION Left 08/05/2018  . EYE SURGERY    . HERNIA REPAIR    . LEAD REVISION/REPAIR N/A 08/10/2018   Procedure: LEAD REVISION/REPAIR;  Surgeon: Deboraha Sprang, MD;  Location: [facility];  Service: Cardiovascular;  Laterality: N/A;  . NM MYOVIEW LTD    . PACEMAKER IMPLANT N/A 08/05/2018   Procedure: St Jude dual lead PACEMAKER IMPLANT;  Surgeon: Sanda Klein, MD;  Location: [facility];  Service: Cardiovascular;  Laterality: N/A;  . Right ingunial hernia repair    . US ECHOCARDIOGRAPHY      Allergies  Allergen Reactions  . Propofol Anaphylaxis    Outpatient Encounter Medications as of 03/20/2020  Medication Sig  . acetaminophen (TYLENOL) 325 MG tablet Take 650 mg by mouth every 6 (six) hours as needed for mild pain.  Marland Kitchen amLODipine (NORVASC) 2.5 MG tablet Take 2.5 mg by mouth daily.  Marland Kitchen aspirin EC 81 MG tablet Take 81 mg by mouth daily.  . carbidopa-levodopa (SINEMET IR) 25-100 MG tablet Take 1 tablet by mouth 3 (three) times daily. (0830, 1330, & 2030)  . donepezil (ARICEPT) 10 MG tablet Take 10 mg by mouth daily.   Marland Kitchen escitalopram (  LEXAPRO) 10 MG tablet TAKE 1 TABLET BY MOUTH AT BEDTIME  . L-Methylfolate-B12-B6-B2 (CEREFOLIN) 02-28-49-5 MG TABS TAKE 1 TABLET BY MOUTH EVERY DAY  . Melatonin-Pyridoxine (MELATIN PO) Take 3 mg by mouth at bedtime.  . mirtazapine (REMERON) 15 MG tablet Take 15 mg by mouth at bedtime. 1/2 tablet at night time  . Multiple Vitamin (MULTIVITAMIN WITH MINERALS) TABS tablet Take 1 tablet by mouth daily.   No facility-administered encounter medications on file as of 03/20/2020.    Review of Systems:  Review of Systems  Constitutional: Negative for chills, fever and weight loss.       Wt up  Eyes: Negative for blurred vision.  Respiratory: Negative for shortness of breath.   Cardiovascular: Negative for chest pain, palpitations and leg swelling.   Gastrointestinal: Negative for abdominal pain, constipation and diarrhea.  Genitourinary: Positive for frequency. Negative for dysuria.  Skin: Negative for rash.  Neurological: Positive for tremors. Negative for dizziness and loss of consciousness.       Unsteady gait and still not using assistive device  Endo/Heme/Allergies: Bruises/bleeds easily.  Psychiatric/Behavioral: Positive for memory loss. Negative for depression. The patient is not nervous/anxious and does not have insomnia.     Health Maintenance  Topic Date Due  . Hepatitis C Screening  Never done  . PNA vac Low Risk Adult (2 of 2 - PPSV23) 08/09/2018  . INFLUENZA VACCINE  04/30/2020  . TETANUS/TDAP  11/06/2029  . COVID-19 Vaccine  Completed    Physical Exam: Vitals:   03/20/20 1434  BP: 122/70  Pulse: 60  Temp: (!) 97.5 F (36.4 C)  SpO2: 98%  Weight: 123 lb (55.8 kg)  Height: 5\' 9"  (1.753 m)   Body mass index is 18.16 kg/m. Physical Exam Vitals reviewed.  Constitutional:      General: He is not in acute distress.    Appearance: He is not toxic-appearing.  HENT:     Head: Normocephalic and atraumatic.  Cardiovascular:     Rate and Rhythm: Normal rate and regular rhythm.     Pulses: Normal pulses.     Heart sounds: Normal heart sounds.  Pulmonary:     Effort: Pulmonary effort is normal.     Breath sounds: Normal breath sounds.  Abdominal:     General: Bowel sounds are normal.     Palpations: Abdomen is soft.  Musculoskeletal:     Right lower leg: No edema.     Left lower leg: No edema.  Skin:    General: Skin is warm and dry.  Neurological:     General: No focal deficit present.     Mental Status: He is alert.     Gait: Gait abnormal.     Comments: Masked facies, shuffling gait     Labs reviewed: Basic Metabolic Panel: Recent Labs    11/07/19 1738 03/16/20 0843  NA 139 142  K 4.3 4.6  CL 97* 101  CO2 30 36*  GLUCOSE 136* 91  BUN 21 14  CREATININE 0.97 1.02  CALCIUM 9.7 9.5    Liver Function Tests: Recent Labs    03/16/20 0843  AST 22  ALT 13  BILITOT 1.0  PROT 6.5   No results for input(s): LIPASE, AMYLASE in the last 8760 hours. No results for input(s): AMMONIA in the last 8760 hours. CBC: Recent Labs    11/07/19 1738 03/16/20 0843  WBC 7.5 10.3  NEUTROABS 5.0 6,623  HGB 13.7 12.7*  HCT 42.3 38.0*  MCV 99.1 92.2  PLT 193 227   Lipid Panel: No results for input(s): CHOL, HDL, LDLCALC, TRIG, CHOLHDL, LDLDIRECT in the last 8760 hours. No results found for: HGBA1C  Procedures since last visit: No results found.  Assessment/Plan 1. Lewy body dementia without behavioral disturbance ([place]) -no dramatic change since last visit reported by his wife  2. Underweight -has gained 8 lbs since last time I saw him which is a great improvement with nutrition--appreciate their assistance  3. Unsteady gait when walking -refuses to use assistive device even after therapy from the terrible fall he had  4. Chronic diastolic CHF (congestive heart failure) (HCC) -stable, euvolemic, cont same regimen and monitor  5. Anemia, unspecified type -very mild, no signs of blood loss, monitor  Labs/tests ordered:  No new Next appt:  07/24/2020   Dawaun Brancato L. Myelle Poteat, D.O. [facility] [address], [place] [address] Cell Phone (Mon-Fri 8am-5pm):  [phone] On Call:  [phone] & follow prompts after 5pm & weekends Office Phone:  [phone] Office Fax:  [phone]

## 2020-03-23 ENCOUNTER — Encounter: Payer: Self-pay | Admitting: Nutrition

## 2020-04-27 ENCOUNTER — Encounter: Payer: Self-pay | Admitting: Internal Medicine

## 2020-04-27 ENCOUNTER — Other Ambulatory Visit: Payer: Self-pay

## 2020-04-27 ENCOUNTER — Telehealth: Payer: Self-pay

## 2020-04-27 ENCOUNTER — Ambulatory Visit (INDEPENDENT_AMBULATORY_CARE_PROVIDER_SITE_OTHER): Payer: Medicare Other | Admitting: Internal Medicine

## 2020-04-27 DIAGNOSIS — R066 Hiccough: Secondary | ICD-10-CM | POA: Diagnosis not present

## 2020-04-27 MED ORDER — BACLOFEN 5 MG PO TABS: 5.0000 mg | ORAL_TABLET | Freq: Every evening | ORAL | 0 refills | Status: DC | PRN

## 2020-04-27 NOTE — Patient Instructions (Signed)
Hiccups  A hiccup is the result of a sudden irritation of a muscle that is used for breathing (diaphragm). The diaphragm is located under your lungs and above your stomach. When the diaphragm gets irritated, it may quickly tighten without your control (have a spasm). The spasm causes you to quickly suck in air, and that causes your vocal cords to close together quickly. These reactions cause the hiccup sound. Hiccups usually last only a short amount of time (less than 48 hours). In unusual cases, they can last for days or months and require you to see your health care provider. Common causes of hiccups include:  Eating too quickly.  Eating too much food.  Drinking alcohol or bubbly (carbonated) drinks.  Eating or drinking hot or spicy foods and drinks.  Swallowing extra air when sucking on candy or a straw or chewing on gum.  Feeling nervous, stressed, or excited.  Having certain conditions that irritate the diaphragm nerves.  Having metabolic or nervous system disorders. Follow these instructions at home:  Watch your hiccups for any changes.  To prevent hiccups or to lessen discomfort from hiccups: ? Eat and chew your food slowly. ? Eat small meals, and avoid overeating. ? Limit alcohol intake to no more than 1 drink a day for nonpregnant women and 2 drinks a day for men. One drink equals 12 oz of beer, 5 oz of wine, or 1 oz of hard liquor. ? Limit your drinking of carbonated or fizzy drinks, such as soda. ? Avoid eating or drinking hot or spicy foods and drinks.  Take over-the-counter and prescription medicines only as told by your health care provider. Contact a health care provider if:  Your hiccups last for more than 48 hours.  Your hiccups do not improve with treatment.  You cannot sleep or eat because of the hiccups.  You have unexpected weight loss due to the hiccups.  You have numbness, tingling, or weakness. Get help right away if:  You have trouble breathing  or swallowing.  You have severe pain in your abdomen. Summary  A hiccup is the result of a sudden irritation of a muscle that is used for breathing (diaphragm).  Hiccups can be caused by many things, including eating too quickly.  See your health care provider if your hiccups last for more than 48 hours. This information is not intended to replace advice given to you by your health care provider. Make sure you discuss any questions you have with your health care provider. Document Revised: 08/29/2017 Document Reviewed: 05/26/2017 Elsevier Patient Education  2020 [employer].

## 2020-04-27 NOTE — Telephone Encounter (Signed)
Charles [name], Charles [name] are scheduled for a virtual visit with your provider today.    Just as we do with appointments in the office, we must obtain your consent to participate.  Your consent will be active for this visit and any virtual visit you may have with one of our providers in the next 365 days.    If you have a MyChart account, I can also send a copy of this consent to you electronically.  All virtual visits are billed to your insurance company just like a traditional visit in the office.  As this is a virtual visit, video technology does not allow for your provider to perform a traditional examination.  This may limit your provider's ability to fully assess your condition.  If your provider identifies any concerns that need to be evaluated in person or the need to arrange testing such as labs, EKG, etc, we will make arrangements to do so.    Although advances in technology are sophisticated, we cannot ensure that it will always work on either your end or our end.  If the connection with a video visit is poor, we may have to switch to a telephone visit.  With either a video or telephone visit, we are not always able to ensure that we have a secure connection.   I need to obtain your verbal consent now.   Are you willing to proceed with your visit today?   Charles [name] has provided verbal consent on 04/27/2020 for a virtual visit (video or telephone).   Tanna Savoy, [place] 04/27/2020  2:11 PM

## 2020-04-27 NOTE — Progress Notes (Signed)
This service is provided via telemedicine  No vital signs collected/recorded due to the encounter was a telemedicine visit.   Location of patient (ex: home, work): home  Patient consents to a telephone visit:yes   Location of the provider (ex: office, home): [facility]  Name of any referring provider: Dr. Mariea Clonts DO   Names of all persons participating in the telemedicine service and their role in the encounter: Dr. Mariea Clonts DO, Charlene CMA, Doren Custard and [place]  Time spent on call: 47min    Location:  [facility] clinic Provider: Genifer Lazenby L. Mariea Clonts, D.O., C.M.D.  Code Status: DNR Goals of Care:  Advanced Directives 04/27/2020  Does Patient Have a Medical Advance Directive? Yes  Type of Advance Directive Living will;Healthcare Power of Attorney  Does patient want to make changes to medical advance directive? No - Patient declined  Copy of [place] in Chart? -     Chief Complaint  Patient presents with  . Acute Visit    vibration is stomach causing hiccups x a week     HPI: Patient is a 79 y.o. male with parkinson's and lewy body dementia spoken with via phone visit for an acute visit for vibrations in his stomach and hiccups day and night since last thursday.  Began last night after he got through taking his pills, stomach would vibrate up and down, then hiccups would start.  He'd eventually go to sleep.  He'd wake up the next morning and be gone.  That continued like that until yesterday evening when it started at 5pm and lasted until 3am.  Went to sleep.   No trouble since he got up this am.  Tried the drink water, bend knees up to chest, etc.    Past Medical History:  Diagnosis Date  . AC joint separation    Right shoulder  . Acute pericarditis 08/09/2018   Admitted 11/19 for pericarditis; ? Related to recent pacer implant; small eff on echo/no tamponade  . Anxiety   . Aortic regurgitation   . Complication of anesthesia   . Dementia, Lewy body with behavior disturbance ([place])    . Depression   . Diastolic dysfunction   . Hypertension   . Left leg pain   . Microcytic anemia   . 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 SSS (sick sinus syndrome) ([place])     Past Surgical History:  Procedure Laterality Date  . acromioclavicular separation  07/04/2016  . CHEST TUBE INSERTION Left 08/05/2018  . EYE SURGERY    . HERNIA REPAIR    . LEAD REVISION/REPAIR N/A 08/10/2018   Procedure: LEAD REVISION/REPAIR;  Surgeon: Deboraha Sprang, MD;  Location: [facility];  Service: Cardiovascular;  Laterality: N/A;  . NM MYOVIEW LTD    . PACEMAKER IMPLANT N/A 08/05/2018   Procedure: St Jude dual lead PACEMAKER IMPLANT;  Surgeon: Sanda Klein, MD;  Location: [facility];  Service: Cardiovascular;  Laterality: N/A;  . Right ingunial hernia repair    . US ECHOCARDIOGRAPHY      Allergies  Allergen Reactions  . Propofol Anaphylaxis    Outpatient Encounter Medications as of 04/27/2020  Medication Sig  . acetaminophen (TYLENOL) 325 MG tablet Take 650 mg by mouth every 6 (six) hours as needed for mild pain.  Marland Kitchen amLODipine (NORVASC) 2.5 MG tablet Take 2.5 mg by mouth daily.  Marland Kitchen aspirin EC 81 MG tablet Take 81 mg by mouth daily.  Marland Kitchen  carbidopa-levodopa (SINEMET IR) 25-100 MG tablet Take 1 tablet by mouth 3 (three) times daily. (0830, 1330, & 2030)  . donepezil (ARICEPT) 10 MG tablet Take 10 mg by mouth daily.   Marland Kitchen escitalopram (LEXAPRO) 10 MG tablet TAKE 1 TABLET BY MOUTH AT BEDTIME  . L-Methylfolate-B12-B6-B2 (CEREFOLIN) 02-28-49-5 MG TABS TAKE 1 TABLET BY MOUTH EVERY DAY  . Melatonin-Pyridoxine (MELATIN PO) Take 3 mg by mouth at bedtime.  . mirtazapine (REMERON) 15 MG tablet Take 15 mg by mouth at bedtime. 1/2 tablet at night time  . Multiple Vitamin (MULTIVITAMIN WITH MINERALS) TABS tablet Take 1 tablet by mouth daily.   No facility-administered encounter medications on file as of 04/27/2020.    Review of Systems:  Review  of Systems  Constitutional: Negative for chills and fever.  Respiratory: Negative for cough and shortness of breath.   Cardiovascular: Negative for chest pain and palpitations.  Gastrointestinal: Negative for abdominal pain, blood in stool, constipation, diarrhea, heartburn, melena, nausea and vomiting.       Vibration in his stomach and hiccups  Genitourinary: Negative for dysuria.    Health Maintenance  Topic Date Due  . Hepatitis C Screening  Never done  . PNA vac Low Risk Adult (2 of 2 - PPSV23) 08/09/2018  . INFLUENZA VACCINE  04/30/2020  . TETANUS/TDAP  11/06/2029  . COVID-19 Vaccine  Completed    Phone visit  Labs reviewed: Basic Metabolic Panel: Recent Labs    11/07/19 1738 03/16/20 0843  NA 139 142  K 4.3 4.6  CL 97* 101  CO2 30 36*  GLUCOSE 136* 91  BUN 21 14  CREATININE 0.97 1.02  CALCIUM 9.7 9.5   Liver Function Tests: Recent Labs    03/16/20 0843  AST 22  ALT 13  BILITOT 1.0  PROT 6.5   No results for input(s): LIPASE, AMYLASE in the last 8760 hours. No results for input(s): AMMONIA in the last 8760 hours. CBC: Recent Labs    11/07/19 1738 03/16/20 0843  WBC 7.5 10.3  NEUTROABS 5.0 6,623  HGB 13.7 12.7*  HCT 42.3 38.0*  MCV 99.1 92.2  PLT 193 227   Lipid Panel: No results for input(s): CHOL, HDL, LDLCALC, TRIG, CHOLHDL, LDLDIRECT in the last 8760 hours. No results found for: HGBA1C  Procedures since last visit: No results found.  Assessment/Plan 1. Intractable hiccups - for a full week, mostly at night -only other symptoms is vibration in stomach prior to them starting - DG Chest 2 View in am - Basic metabolic panel in am - Baclofen 5 MG TABS; Take 5 mg by mouth at bedtime as needed (hiccups).  Dispense: 30 tablet; Refill: 0  Labs/tests ordered:   Orders Placed This Encounter  Procedures  . DG Chest 2 View    Order Specific Question:   Reason for Exam (SYMPTOM  OR DIAGNOSIS REQUIRED)    Answer:   persistent hiccups, ?  lymphadenopathy    Order Specific Question:   Preferred imaging location?    Answer:   [facility]    Order Specific Question:   Release to patient    Answer:   Immediate  . Basic metabolic panel    Order Specific Question:   Has the patient fasted?    Answer:   Yes    Order Specific Question:   Release to patient    Answer:   Immediate   28 MINUTES NON FACE-TO-FACE SPENT ON TELEVISIT  Next appt:  07/24/2020  Analyce Tavares L. Sakara Lehtinen, D.O. Geriatrics  [facility] [address], [place] [address] Cell Phone (Mon-Fri 8am-5pm):  [phone] On Call:  [phone] & follow prompts after 5pm & weekends Office Phone:  [phone] Office Fax:  [phone]

## 2020-04-28 ENCOUNTER — Other Ambulatory Visit: Payer: Medicare Other

## 2020-04-28 ENCOUNTER — Ambulatory Visit
Admission: RE | Admit: 2020-04-28 | Discharge: 2020-04-28 | Disposition: A | Discharge status: Home / Self Care | Payer: Medicare Other | Source: Ambulatory Visit | Attending: Internal Medicine | Admitting: Internal Medicine

## 2020-04-28 ENCOUNTER — Other Ambulatory Visit: Payer: Self-pay

## 2020-04-28 DIAGNOSIS — Z950 Presence of cardiac pacemaker: Secondary | ICD-10-CM | POA: Diagnosis not present

## 2020-04-28 DIAGNOSIS — R066 Hiccough: Secondary | ICD-10-CM | POA: Diagnosis not present

## 2020-04-28 LAB — BASIC METABOLIC PANEL
BUN: 18 mg/dL (ref 7–25)
CO2: 32 mmol/L (ref 20–32)
Calcium: 9.3 mg/dL (ref 8.6–10.3)
Chloride: 101 mmol/L (ref 98–110)
Creat: 1.08 mg/dL (ref 0.70–1.18)
Glucose, Bld: 90 mg/dL (ref 65–99)
Potassium: 4.3 mmol/L (ref 3.5–5.3)
Sodium: 140 mmol/L (ref 135–146)

## 2020-05-09 ENCOUNTER — Ambulatory Visit (INDEPENDENT_AMBULATORY_CARE_PROVIDER_SITE_OTHER): Payer: Medicare Other | Admitting: *Deleted

## 2020-05-09 DIAGNOSIS — I495 Sick sinus syndrome: Secondary | ICD-10-CM | POA: Diagnosis not present

## 2020-05-09 LAB — CUP PACEART REMOTE DEVICE CHECK
Battery Remaining Longevity: 128 mo
Battery Remaining Percentage: 95.5 %
Battery Voltage: 3.01 V
Brady Statistic AP VP Percent: 1 %
Brady Statistic AP VS Percent: 81 %
Brady Statistic AS VP Percent: 1 %
Brady Statistic AS VS Percent: 19 %
Brady Statistic RA Percent Paced: 80 %
Brady Statistic RV Percent Paced: 1 %
Date Time Interrogation Session: 20210810020013
Implantable Lead Implant Date: 20191106
Implantable Lead Implant Date: 20191111
Implantable Lead Location: 753859
Implantable Lead Location: 753860
Implantable Lead Model: 1948
Implantable Pulse Generator Implant Date: 20191106
Lead Channel Impedance Value: 410 Ohm
Lead Channel Impedance Value: 640 Ohm
Lead Channel Pacing Threshold Amplitude: 0.5 V
Lead Channel Pacing Threshold Amplitude: 0.75 V
Lead Channel Pacing Threshold Pulse Width: 0.5 ms
Lead Channel Pacing Threshold Pulse Width: 0.5 ms
Lead Channel Sensing Intrinsic Amplitude: 1.3 mV
Lead Channel Sensing Intrinsic Amplitude: 4.6 mV
Lead Channel Setting Pacing Amplitude: 1 V
Lead Channel Setting Pacing Amplitude: 1.5 V
Lead Channel Setting Pacing Pulse Width: 0.5 ms
Lead Channel Setting Sensing Sensitivity: 0.5 mV
Pulse Gen Model: 2272
Pulse Gen Serial Number: 9083517

## 2020-05-11 NOTE — Progress Notes (Signed)
Remote pacemaker transmission.   

## 2020-05-12 ENCOUNTER — Telehealth: Payer: Self-pay

## 2020-05-12 MED ORDER — CEREFOLIN 6-1-50-5 MG PO TABS: 1.0000 | ORAL_TABLET | Freq: Every day | ORAL | 1 refills | Status: DC

## 2020-05-12 NOTE — Telephone Encounter (Signed)
Medication refill request received from [facility] for Cerefolin Levomefolate/vit B /Vit B/Vit B2 02/28/49/5 mg tablet  Take one daily

## 2020-06-06 ENCOUNTER — Encounter: Payer: Self-pay | Admitting: Nutrition

## 2020-06-06 ENCOUNTER — Encounter: Payer: Medicare Other | Attending: Internal Medicine | Admitting: Nutrition

## 2020-06-06 ENCOUNTER — Other Ambulatory Visit: Payer: Self-pay

## 2020-06-06 VITALS — Ht 69.0 in | Wt 122.2 lb

## 2020-06-06 DIAGNOSIS — G3183 Dementia with Lewy bodies: Secondary | ICD-10-CM | POA: Insufficient documentation

## 2020-06-06 DIAGNOSIS — F0280 Dementia in other diseases classified elsewhere without behavioral disturbance: Secondary | ICD-10-CM

## 2020-06-06 DIAGNOSIS — R636 Underweight: Secondary | ICD-10-CM

## 2020-06-06 DIAGNOSIS — R634 Abnormal weight loss: Secondary | ICD-10-CM

## 2020-06-06 NOTE — Progress Notes (Signed)
  Medical Nutrition Therapy:  Appt start time: 1430 end time:  1500   Assessment:  Primary concerns today: Weigh loss, underweight, Lewy Body Dementia. Here with his wife. His wife notes he is eating more and eating better.  He notes he is eating well. His wife notes he is eating more of a variety of foods now and small snacks. Lost 1 lb since last visit.. Drinking supplements to support his calories. Doing much better.    His wife is using some of the high calorie high protein recipes given last visit.    Wt Readings from Last 3 Encounters:  06/06/20 122 lb 3.2 oz (55.4 kg)  03/20/20 123 lb (55.8 kg)  02/29/20 120 lb (54.4 kg)   Ht Readings from Last 3 Encounters:  06/06/20 5\' 9"  (1.753 m)  03/20/20 5\' 9"  (1.753 m)  02/29/20 5\' 9"  (1.753 m)     Preferred Learning Style:   No preference indicated   Learning Readiness:   Change in progress  MEDICATIONS:    DIETARY INTAKE:   24-hr recall:  B ( AM): Egg, waffles, coffee Snk ( AM):  [place] fries L ( PM): meatloaf, baked potato, ice cream, pork n beans.  Snk ( PM): chocolate chips 2 or fig bars. D ( PM): Boost or sandwich,  Snk ( PM): sometimes will drink a Boost  Beverages: water.  Usual physical activity:  ADL   Estimated energy needs: 2000  calories 225 g carbohydrates 150 g protein 56 g fat  Progress Towards Goal(s):  In progress.   Nutritional Diagnosis:  NB-1.1 Food and nutrition-related knowledge deficit As related to Underweight and weight loss.  As evidenced by BMI 16.    Intervention:  High Calorie High Protein diet. Healthy snacks. Goal  Keep up the great job Gain 1 lb per month Eat small frequesnt meals. Follow High Calorie diet as discussed.  Teaching Method Utilized:  Visual Auditory Hands on  Handouts given during visit include:  High Calorie High Protein Diet  Ways to increase calories   Barriers to learning/adherence to lifestyle change: none  Demonstrated degree of  understanding via:  Teach Back   Monitoring/Evaluation:  Dietary intake, exercise,  and body weight in 6 months.).

## 2020-06-06 NOTE — Patient Instructions (Signed)
Goal  Keep up the great job Gain 1 lb per month Eat small frequesnt meals. Follow High Calorie diet as discussed.

## 2020-06-07 DIAGNOSIS — H26493 Other secondary cataract, bilateral: Secondary | ICD-10-CM | POA: Diagnosis not present

## 2020-06-07 DIAGNOSIS — H524 Presbyopia: Secondary | ICD-10-CM | POA: Diagnosis not present

## 2020-06-07 DIAGNOSIS — Z961 Presence of intraocular lens: Secondary | ICD-10-CM | POA: Diagnosis not present

## 2020-06-07 DIAGNOSIS — H5203 Hypermetropia, bilateral: Secondary | ICD-10-CM | POA: Diagnosis not present

## 2020-06-07 DIAGNOSIS — H52223 Regular astigmatism, bilateral: Secondary | ICD-10-CM | POA: Diagnosis not present

## 2020-06-28 ENCOUNTER — Telehealth: Payer: Self-pay | Admitting: Cardiovascular Disease

## 2020-06-28 NOTE — Telephone Encounter (Signed)
lvm for patient to return call to get Pacer Check follow up visit scheduled with Cro[place]u from recall list

## 2020-07-03 ENCOUNTER — Other Ambulatory Visit: Payer: Self-pay | Admitting: Internal Medicine

## 2020-07-03 DIAGNOSIS — F324 Major depressive disorder, single episode, in partial remission: Secondary | ICD-10-CM

## 2020-07-03 MED ORDER — ESCITALOPRAM OXALATE 10 MG PO TABS: 10.0000 mg | ORAL_TABLET | Freq: Every day | ORAL | 3 refills | Status: DC

## 2020-07-03 NOTE — Addendum Note (Signed)
Addended by: Gayland Curry on: 07/03/2020 05:19 PM   Modules accepted: Orders

## 2020-07-03 NOTE — Telephone Encounter (Signed)
Received refill request from pharmacy Pended Rx and sent to Dr. Mariea Clonts for approval due to [place].

## 2020-07-05 ENCOUNTER — Other Ambulatory Visit: Payer: Self-pay | Admitting: Internal Medicine

## 2020-07-05 DIAGNOSIS — F324 Major depressive disorder, single episode, in partial remission: Secondary | ICD-10-CM

## 2020-07-05 NOTE — Telephone Encounter (Signed)
RX was approved for #90/3 refills on 07/03/2020 (2 days ago)

## 2020-07-24 ENCOUNTER — Encounter: Payer: Self-pay | Admitting: Internal Medicine

## 2020-07-24 ENCOUNTER — Other Ambulatory Visit: Payer: Self-pay

## 2020-07-24 ENCOUNTER — Ambulatory Visit (INDEPENDENT_AMBULATORY_CARE_PROVIDER_SITE_OTHER): Payer: Medicare Other | Admitting: Internal Medicine

## 2020-07-24 VITALS — BP 110/62 | HR 68 | Temp 97.7°F | Ht 69.0 in | Wt 122.2 lb

## 2020-07-24 DIAGNOSIS — Z23 Encounter for immunization: Secondary | ICD-10-CM

## 2020-07-24 DIAGNOSIS — G3183 Dementia with Lewy bodies: Secondary | ICD-10-CM

## 2020-07-24 DIAGNOSIS — R636 Underweight: Secondary | ICD-10-CM | POA: Diagnosis not present

## 2020-07-24 DIAGNOSIS — R2681 Unsteadiness on feet: Secondary | ICD-10-CM | POA: Diagnosis not present

## 2020-07-24 DIAGNOSIS — L219 Seborrheic dermatitis, unspecified: Secondary | ICD-10-CM | POA: Diagnosis not present

## 2020-07-24 DIAGNOSIS — Z681 Body mass index (BMI) 19 or less, adult: Secondary | ICD-10-CM | POA: Diagnosis not present

## 2020-07-24 DIAGNOSIS — F0280 Dementia in other diseases classified elsewhere without behavioral disturbance: Secondary | ICD-10-CM | POA: Diagnosis not present

## 2020-07-24 DIAGNOSIS — Z1159 Encounter for screening for other viral diseases: Secondary | ICD-10-CM | POA: Diagnosis not present

## 2020-07-24 NOTE — Progress Notes (Signed)
Location:  [name] clinic Provider:  Yader Criger L. Mariea Clonts, D.O., C.M.D.  Code Status: DNR Goals of Care:  Advanced Directives 07/24/2020  Does Patient Have a Medical Advance Directive? Yes  Type of Advance Directive -  Does patient want to make changes to medical advance directive? No - Patient declined  Copy of Charles [name] in Chart? No - copy requested     Chief Complaint  Patient presents with  . Medical Management of Chronic Issues    Hep C  . Health Maintenance    PNA, Hep C  and influenza     HPI: Patient is a 79 y.o. [name] seen today for medical management of chronic diseases.    He reports nothing is new.  [name] notes he's gained a little weight up to 122 lbs.  He's 118 at home.  Once in a while still has upset stomach.    Memory is about the same.    No recent falls.    Rests well at night.    No pain.  Seeing and hearing ok.     He had his eye exam.    Got his flu shot here today.    Past Medical History:  Diagnosis Date  . AC joint separation    Right shoulder  . Acute pericarditis 08/09/2018   Admitted 11/19 for pericarditis; ? Related to recent pacer implant; small eff on echo/no tamponade  . Anxiety   . Aortic regurgitation   . Complication of anesthesia   . Dementia, Lewy body with behavior disturbance ([place])   . Depression   . Diastolic dysfunction   . Hypertension   . Left leg pain   . Microcytic anemia   . 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 SSS (sick sinus syndrome) ([place])     Past Surgical History:  Procedure Laterality Date  . acromioclavicular separation  07/04/2016  . CHEST TUBE INSERTION Left 08/05/2018  . EYE SURGERY    . HERNIA REPAIR    . LEAD REVISION/REPAIR N/A 08/10/2018   Procedure: LEAD REVISION/REPAIR;  Surgeon: Deboraha Sprang, MD;  Location: [facility];  Service: Cardiovascular;  Laterality: N/A;  . NM MYOVIEW LTD    . PACEMAKER IMPLANT N/A 08/05/2018     Procedure: St Jude dual lead PACEMAKER IMPLANT;  Surgeon: Sanda Klein, MD;  Location: [facility];  Service: Cardiovascular;  Laterality: N/A;  . Right ingunial hernia repair    . US ECHOCARDIOGRAPHY      Allergies  Allergen Reactions  . Propofol Anaphylaxis    Outpatient Encounter Medications as of 07/24/2020  Medication Sig  . acetaminophen (TYLENOL) 325 MG tablet Take 650 mg by mouth every 6 (six) hours as needed for mild pain.  Marland Kitchen amLODipine (NORVASC) 2.5 MG tablet Take 2.5 mg by mouth daily.  Marland Kitchen aspirin EC 81 MG tablet Take 81 mg by mouth daily.  . carbidopa-levodopa (SINEMET IR) 25-100 MG tablet Take 1 tablet by mouth 3 (three) times daily. (0830, 1330, & 2030)  . donepezil (ARICEPT) 10 MG tablet Take 10 mg by mouth daily.   Marland Kitchen escitalopram (LEXAPRO) 10 MG tablet Take 1 tablet (10 mg total) by mouth at bedtime.  Marland Kitchen L-Methylfolate-B12-B6-B2 (CEREFOLIN) 02-28-49-5 MG TABS Take 1 tablet by mouth daily.  . Melatonin-Pyridoxine (MELATIN PO) Take 3 mg by mouth at bedtime.  . mirtazapine (REMERON) 15 MG tablet Take 15 mg by mouth at bedtime. 1/2 tablet at  night time  . Multiple Vitamin (MULTIVITAMIN WITH MINERALS) TABS tablet Take 1 tablet by mouth daily.  . [DISCONTINUED] Baclofen 5 MG TABS Take 5 mg by mouth at bedtime as needed (hiccups).   No facility-administered encounter medications on file as of 07/24/2020.    Review of Systems:  Review of Systems  Constitutional: Negative for chills, fever, malaise/fatigue and weight loss.  HENT: Negative for congestion and sore throat.   Eyes: Negative for blurred vision.  Respiratory: Negative for cough and shortness of breath.   Cardiovascular: Negative for chest pain, palpitations and leg swelling.  Gastrointestinal: Negative for abdominal pain, blood in stool, constipation and melena.  Genitourinary: Negative for dysuria.  Musculoskeletal: Negative for back pain, falls and joint pain.  Skin: Positive for rash. Negative for  itching.       Where he shaved  Neurological: Negative for dizziness and loss of consciousness.       Unsteady gait  Endo/Heme/Allergies: Bruises/bleeds easily.  Psychiatric/Behavioral: Positive for memory loss. Negative for depression. The patient is not nervous/anxious and does not have insomnia.     Health Maintenance  Topic Date Due  . Hepatitis C Screening  Never done  . PNA vac Low Risk Adult (2 of 2 - PPSV23) 08/09/2018  . TETANUS/TDAP  11/06/2029  . INFLUENZA VACCINE  Completed  . COVID-19 Vaccine  Completed    Physical Exam: Vitals:   07/24/20 1427  BP: 110/62  Pulse: 68  Temp: 97.7 F (36.5 C)  TempSrc: Temporal  SpO2: 95%  Weight: 122 lb 3.2 oz (55.4 kg)  Height: 5\' 9"  (1.753 m)   Body mass index is 18.05 kg/m. Physical Exam Vitals reviewed.  Constitutional:      General: He is not in acute distress.    Appearance: He is not toxic-appearing.     Comments: Thin [name]  HENT:     Head: Normocephalic and atraumatic.  Cardiovascular:     Rate and Rhythm: Normal rate and regular rhythm.     Pulses: Normal pulses.     Heart sounds: Normal heart sounds.  Pulmonary:     Effort: Pulmonary effort is normal.     Breath sounds: Normal breath sounds. No wheezing, rhonchi or rales.  Musculoskeletal:        General: Normal range of motion.     Comments: unsteady gait, stooped posture, still not using assistive device  Skin:    General: Skin is warm and dry.     Comments: Scaly erythema of ears and jawline  Neurological:     General: No focal deficit present.     Mental Status: He is alert.     Labs reviewed: Basic Metabolic Panel: Recent Labs    11/07/19 1738 03/16/20 0843 04/28/20 0920  NA 139 142 140  K 4.3 4.6 4.3  CL 97* 101 101  CO2 30 36* 32  GLUCOSE 136* 91 90  BUN 21 14 18   CREATININE 0.97 1.02 1.08  CALCIUM 9.7 9.5 9.3   Liver Function Tests: Recent Labs    03/16/20 0843  AST 22  ALT 13  BILITOT 1.0  PROT 6.5   No results for  input(s): LIPASE, AMYLASE in the last 8760 hours. No results for input(s): AMMONIA in the last 8760 hours. CBC: Recent Labs    11/07/19 1738 03/16/20 0843  WBC 7.5 10.3  NEUTROABS 5.0 6,623  HGB 13.7 12.7*  HCT 42.3 38.0*  MCV 99.1 92.2  PLT 193 227   Lipid Panel: No results for  input(s): CHOL, HDL, LDLCALC, TRIG, CHOLHDL, LDLDIRECT in the last 8760 hours. No results found for: HGBA1C  Procedures since last visit: No results found.  Assessment/Plan 1. Lewy body dementia without behavioral disturbance ([place]) -continues to gradually progress, but no new concerns per Tana Conch and [place] today  2. Underweight - has gained a couple of lbs which is great, cont diet as per dietitian that gave advice for increased calorie foods - CBC with Differential/Platelet; Future - Basic metabolic panel; Future - Hepatitis C antibody; Future  3. Unsteady gait when walking -remains, not using assistive device--had only for a short time after his fall and then stopped  4. Seborrheic dermatitis -may need a steroid cream if switching off of a regular razor to electric does not help  5. Need for influenza vaccination - Flu Vaccine QUAD High Dose(Fluad)  6. Encounter for hepatitis C screening test for low risk patient - Hepatitis C antibody; Future  7. Body mass index (BMI) of 19 or less in adult -remains, but has trended up slightly with diet changes and frequent small meals and snacks that are higher calorie  Labs/tests ordered:   Lab Orders     CBC with Differential/Platelet     Basic metabolic panel     Hepatitis C antibody  Next appt:  F/u in 4 months with fasting labs before  Marvia Troost L. Doralyn Kirkes, D.O. [facility] [address], [place] [address] Cell Phone (Mon-Fri 8am-5pm):  [phone] On Call:  [phone] & follow prompts after 5pm & weekends Office Phone:  [phone] Office Fax:  [phone]

## 2020-08-08 ENCOUNTER — Ambulatory Visit (INDEPENDENT_AMBULATORY_CARE_PROVIDER_SITE_OTHER): Payer: Medicare Other

## 2020-08-08 DIAGNOSIS — I495 Sick sinus syndrome: Secondary | ICD-10-CM | POA: Diagnosis not present

## 2020-08-08 LAB — CUP PACEART REMOTE DEVICE CHECK
Battery Remaining Longevity: 128 mo
Battery Remaining Percentage: 95.5 %
Battery Voltage: 3.01 V
Brady Statistic AP VP Percent: 1 %
Brady Statistic AP VS Percent: 81 %
Brady Statistic AS VP Percent: 1 %
Brady Statistic AS VS Percent: 19 %
Brady Statistic RA Percent Paced: 80 %
Brady Statistic RV Percent Paced: 1 %
Date Time Interrogation Session: 20211109020015
Implantable Lead Implant Date: 20191106
Implantable Lead Implant Date: 20191111
Implantable Lead Location: 753859
Implantable Lead Location: 753860
Implantable Lead Model: 1948
Implantable Pulse Generator Implant Date: 20191106
Lead Channel Impedance Value: 410 Ohm
Lead Channel Impedance Value: 640 Ohm
Lead Channel Pacing Threshold Amplitude: 0.5 V
Lead Channel Pacing Threshold Amplitude: 0.625 V
Lead Channel Pacing Threshold Pulse Width: 0.5 ms
Lead Channel Pacing Threshold Pulse Width: 0.5 ms
Lead Channel Sensing Intrinsic Amplitude: 1.3 mV
Lead Channel Sensing Intrinsic Amplitude: 3.7 mV
Lead Channel Setting Pacing Amplitude: 0.875
Lead Channel Setting Pacing Amplitude: 1.5 V
Lead Channel Setting Pacing Pulse Width: 0.5 ms
Lead Channel Setting Sensing Sensitivity: 0.5 mV
Pulse Gen Model: 2272
Pulse Gen Serial Number: 9083517

## 2020-08-10 ENCOUNTER — Telehealth: Payer: Self-pay | Admitting: *Deleted

## 2020-08-10 DIAGNOSIS — L219 Seborrheic dermatitis, unspecified: Secondary | ICD-10-CM

## 2020-08-10 MED ORDER — MOMETASONE FUROATE 0.1 % EX CREA: 1.0000 "application " | TOPICAL_CREAM | Freq: Every day | CUTANEOUS | 3 refills | Status: DC

## 2020-08-10 NOTE — Progress Notes (Signed)
Remote pacemaker transmission.   

## 2020-08-10 NOTE — Telephone Encounter (Signed)
Mometasone steroid cream for daily use on affected areas of his face/neck sent to [facility]

## 2020-08-10 NOTE — Telephone Encounter (Signed)
Patient wife aware

## 2020-08-10 NOTE — Telephone Encounter (Signed)
Patient wife called and stated that when she shaves patient (has used electric and manual) his face and head breaks out and looks sunburned.  Stated that she has discussed this with you at last appointment. Requesting the cream to be called into pharmacy.  Please Advise.    OV NOTE Dated 07/24/2020: 4. Seborrheic dermatitis -Charles [name] need a steroid cream if switching off of a regular razor to electric does not help

## 2020-08-29 ENCOUNTER — Telehealth: Payer: Self-pay

## 2020-08-29 ENCOUNTER — Other Ambulatory Visit: Payer: Self-pay

## 2020-08-29 DIAGNOSIS — F0281 Dementia in other diseases classified elsewhere with behavioral disturbance: Secondary | ICD-10-CM | POA: Diagnosis not present

## 2020-08-29 DIAGNOSIS — G3183 Dementia with Lewy bodies: Secondary | ICD-10-CM | POA: Diagnosis not present

## 2020-08-29 DIAGNOSIS — F0280 Dementia in other diseases classified elsewhere without behavioral disturbance: Secondary | ICD-10-CM | POA: Diagnosis not present

## 2020-08-29 DIAGNOSIS — R453 Demoralization and apathy: Secondary | ICD-10-CM | POA: Diagnosis not present

## 2020-08-29 DIAGNOSIS — Z888 Allergy status to other drugs, medicaments and biological substances status: Secondary | ICD-10-CM | POA: Diagnosis not present

## 2020-08-29 DIAGNOSIS — Z79899 Other long term (current) drug therapy: Secondary | ICD-10-CM | POA: Diagnosis not present

## 2020-08-29 MED ORDER — AMLODIPINE BESYLATE 2.5 MG PO TABS: 2.5000 mg | ORAL_TABLET | Freq: Every day | ORAL | 1 refills | Status: DC

## 2020-08-29 NOTE — Telephone Encounter (Signed)
Two initial considerations:  Has he been taking a lot of supplement shakes recently?  Sometimes they cause diarrhea. 2.    Perhaps, his aricept memory medication is the culprit.  Odd for it to just now begin causing this though.   Has he lost more weight with this?   Has there been blood in the stool or dark colored stool vs his norm? Ideally, he should be evaluated in the office.

## 2020-08-29 NOTE — Telephone Encounter (Signed)
Message left on clinical intake voicemail: Patient's wife called requesting a refill on amlodipine, last refill came from previous doctor and the pharmacy told her she would need to call to get Dr.Reed to approve

## 2020-08-29 NOTE — Telephone Encounter (Signed)
I called patients wife and left a message with Dr.Reed's questions, I also sent questions via mychart.  Awaiting reply

## 2020-08-29 NOTE — Telephone Encounter (Signed)
Patients wife states patient with diarrhea off/on since the 24th. Patient is taking imodium to manage symptoms yet they return a couple days later.   Patient had some issues with diarrhea a few months ago.  Patient denies, nausea, vomiting, or abdominal pain. Patients wife think it is related to dementia, yet would like Dr.Reed's advice or recommendation.  Patients wife did not wish to schedule an appointment when prompted

## 2020-08-30 NOTE — Telephone Encounter (Signed)
Noted  

## 2020-08-30 NOTE — Telephone Encounter (Signed)
Patient's wife returned phone call. She answered no to all of the questions. She made appointment for Monday 09/04/20.

## 2020-09-04 ENCOUNTER — Encounter: Payer: Self-pay | Admitting: Internal Medicine

## 2020-09-04 ENCOUNTER — Ambulatory Visit (INDEPENDENT_AMBULATORY_CARE_PROVIDER_SITE_OTHER): Payer: Medicare Other | Admitting: Internal Medicine

## 2020-09-04 ENCOUNTER — Other Ambulatory Visit: Payer: Self-pay

## 2020-09-04 VITALS — BP 118/58 | HR 61 | Temp 97.8°F | Ht 69.0 in | Wt 122.8 lb

## 2020-09-04 DIAGNOSIS — G3183 Dementia with Lewy bodies: Secondary | ICD-10-CM | POA: Diagnosis not present

## 2020-09-04 DIAGNOSIS — L219 Seborrheic dermatitis, unspecified: Secondary | ICD-10-CM | POA: Diagnosis not present

## 2020-09-04 DIAGNOSIS — F0280 Dementia in other diseases classified elsewhere without behavioral disturbance: Secondary | ICD-10-CM | POA: Diagnosis not present

## 2020-09-04 DIAGNOSIS — R636 Underweight: Secondary | ICD-10-CM | POA: Diagnosis not present

## 2020-09-04 DIAGNOSIS — R2681 Unsteadiness on feet: Secondary | ICD-10-CM | POA: Diagnosis not present

## 2020-09-04 DIAGNOSIS — R197 Diarrhea, unspecified: Secondary | ICD-10-CM | POA: Diagnosis not present

## 2020-09-04 DIAGNOSIS — Z681 Body mass index (BMI) 19 or less, adult: Secondary | ICD-10-CM

## 2020-09-04 NOTE — Progress Notes (Signed)
Location:  [facility] clinic Provider:  Remmy Riffe L. Mariea Clonts, D.O., C.M.D.  Code Status: DNR Goals of Care:  Advanced Directives 09/04/2020  Does Patient Have a Medical Advance Directive? Yes  Type of Paramedic of [place];Living will  Does patient want to make changes to medical advance directive? No - Patient declined  Copy of [place] in Chart? No - copy requested     Chief Complaint  Patient presents with  . Medical Management of Chronic Issues    Follow up on diarrhea. Discuss Dr. Jovita Gamma' suggestions    HPI: Patient is a 79 y.o. male seen today for medical management of chronic diseases.    Diarrhea has settled down.   He saw Dr. Rondel Oh and he suggested continuing his meds as ordered.  F/u in one year.  He had one episode 11/30 and one a few days prior to that.  It's not clear what brought that on.  He takes the imodium which does work for him.  It starts out of the blue w/o warning.  He has incontinence which upsets him.   They'd had a lot of [place] dinner food.  No clear things causing.  [name] does not use a ton of spices or salt.   Weight is stable.  He won't do snacks b/w meals.  His wife had to give up ice cream due to sugar and lactose.  She is encouraging him to still eat it.    They are working on getting him a booster.   He had tow scrapes on his hand and elbow.  Hit the hand in the bathroom.  Hit his arm on the door which broke the skin and it bled.  It's healed.    He holds his head down a lot.  More lately than earlier in his PD.  Past Medical History:  Diagnosis Date  . AC joint separation    Right shoulder  . Acute pericarditis 08/09/2018   Admitted 11/19 for pericarditis; ? Related to recent pacer implant; small eff on echo/no tamponade  . Anxiety   . Aortic regurgitation   . Complication of anesthesia   . Dementia, Lewy body with behavior disturbance ([place])   . Depression   . Diastolic dysfunction   . Hypertension    . Left leg pain   . Microcytic anemia   . 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 SSS (sick sinus syndrome) ([place])     Past Surgical History:  Procedure Laterality Date  . acromioclavicular separation  07/04/2016  . CHEST TUBE INSERTION Left 08/05/2018  . EYE SURGERY    . HERNIA REPAIR    . LEAD REVISION/REPAIR N/A 08/10/2018   Procedure: LEAD REVISION/REPAIR;  Surgeon: Deboraha Sprang, MD;  Location: [facility];  Service: Cardiovascular;  Laterality: N/A;  . NM MYOVIEW LTD    . PACEMAKER IMPLANT N/A 08/05/2018   Procedure: St Jude dual lead PACEMAKER IMPLANT;  Surgeon: Sanda Klein, MD;  Location: [facility];  Service: Cardiovascular;  Laterality: N/A;  . Right ingunial hernia repair    . US ECHOCARDIOGRAPHY      Allergies  Allergen Reactions  . Propofol Anaphylaxis    Outpatient Encounter Medications as of 09/04/2020  Medication Sig  . acetaminophen (TYLENOL) 325 MG tablet Take 650 mg by mouth every 6 (six) hours as needed for mild pain.  Marland Kitchen amLODipine (NORVASC) 2.5 MG tablet Take 1 tablet (2.5  mg total) by mouth daily.  Marland Kitchen aspirin EC 81 MG tablet Take 81 mg by mouth daily.  . carbidopa-levodopa (SINEMET IR) 25-100 MG tablet Take 1 tablet by mouth 3 (three) times daily. (0830, 1330, & 2030)  . donepezil (ARICEPT) 10 MG tablet Take 10 mg by mouth daily.   Marland Kitchen escitalopram (LEXAPRO) 10 MG tablet Take 1 tablet (10 mg total) by mouth at bedtime.  Marland Kitchen L-Methylfolate-B12-B6-B2 (CEREFOLIN) 02-28-49-5 MG TABS Take 1 tablet by mouth daily.  . Melatonin-Pyridoxine (MELATIN PO) Take 3 mg by mouth at bedtime.  . mirtazapine (REMERON) 15 MG tablet Take 15 mg by mouth at bedtime. 1/2 tablet at night time  . mometasone (ELOCON) 0.1 % cream Apply 1 application topically daily. To dry red scaly areas on face  . Multiple Vitamin (MULTIVITAMIN WITH MINERALS) TABS tablet Take 1 tablet by mouth daily.   No facility-administered encounter  medications on file as of 09/04/2020.    Review of Systems:  Review of Systems  Constitutional: Negative for chills and fever.  Eyes: Negative for blurred vision.  Respiratory: Negative for cough and shortness of breath.   Cardiovascular: Negative for chest pain, palpitations and leg swelling.  Gastrointestinal: Positive for diarrhea. Negative for abdominal pain, blood in stool, constipation and melena.  Genitourinary: Negative for dysuria.  Musculoskeletal: Negative for joint pain.       Couple of near falls  Skin: Positive for rash.       Dry scaly skin on sides of face improved  Neurological: Negative for dizziness, loss of consciousness and weakness.  Psychiatric/Behavioral: Positive for depression and memory loss. The patient is not nervous/anxious and does not have insomnia.        Stable depression    Health Maintenance  Topic Date Due  . Hepatitis C Screening  Never done  . PNA vac Low Risk Adult (2 of 2 - PPSV23) 08/09/2018  . TETANUS/TDAP  11/06/2029  . INFLUENZA VACCINE  Completed  . COVID-19 Vaccine  Completed    Physical Exam: Vitals:   09/04/20 1151  BP: (!) 118/58  Pulse: 61  Temp: 97.8 F (36.6 C)  TempSrc: Temporal  SpO2: 98%  Weight: 122 lb 12.8 oz (55.7 kg)  Height: 5\' 9"  (1.753 m)   Body mass index is 18.13 kg/m. Physical Exam Vitals reviewed.  HENT:     Head: Normocephalic and atraumatic.  Eyes:     Conjunctiva/sclera: Conjunctivae normal.     Pupils: Pupils are equal, round, and reactive to light.  Cardiovascular:     Rate and Rhythm: Normal rate and regular rhythm.     Pulses: Normal pulses.     Heart sounds: Normal heart sounds.  Pulmonary:     Effort: Pulmonary effort is normal.     Breath sounds: Normal breath sounds. No wheezing, rhonchi or rales.  Abdominal:     General: Bowel sounds are normal.  Musculoskeletal:        General: Normal range of motion.     Right lower leg: No edema.     Left lower leg: No edema.     Comments:  Stooped posture  Skin:    Comments: Erythema and patchy dry skin resolved on face  Neurological:     General: No focal deficit present.     Mental Status: He is alert and oriented to person, place, and time.     Cranial Nerves: No cranial nerve deficit.     Motor: No weakness.     Gait: Gait abnormal.  Comments: But some short-term memory loss--wife helps out  Psychiatric:        Mood and Affect: Mood normal.     Labs reviewed: Basic Metabolic Panel: Recent Labs    11/07/19 1738 03/16/20 0843 04/28/20 0920  NA 139 142 140  K 4.3 4.6 4.3  CL 97* 101 101  CO2 30 36* 32  GLUCOSE 136* 91 90  BUN 21 14 18   CREATININE 0.97 1.02 1.08  CALCIUM 9.7 9.5 9.3   Liver Function Tests: Recent Labs    03/16/20 0843  AST 22  ALT 13  BILITOT 1.0  PROT 6.5   No results for input(s): LIPASE, AMYLASE in the last 8760 hours. No results for input(s): AMMONIA in the last 8760 hours. CBC: Recent Labs    11/07/19 1738 03/16/20 0843  WBC 7.5 10.3  NEUTROABS 5.0 6,623  HGB 13.7 12.7*  HCT 42.3 38.0*  MCV 99.1 92.2  PLT 193 227   Lipid Panel: No results for input(s): CHOL, HDL, LDLCALC, TRIG, CHOLHDL, LDLDIRECT in the last 8760 hours. No results found for: HGBA1C  Procedures since last visit: CUP [place]  Result Date: 08/08/2020 Scheduled remote reviewed.  Normal device function.  Next remote 91 days- JBox, RN/CVRS   Assessment/Plan 1. Underweight -cont dietary changes as in #2--stable.  2. Body mass index (BMI) of 19 or less in adult -stable weight though with improved diet and increased protein and calories as recommended by dietitian  3. Diarrhea, unspecified type -seems this was a fluke of a couple of days--suspect related to something he ate so no need to change regimen  4. Lewy body dementia without behavioral disturbance ([facility]) -cont current sinemet therapy  5. Unsteady gait when walking -continue to be careful with positional changes and  avoid rapid movement  6. Seborrheic dermatitis -cont mometasone cream   Labs/tests ordered:  * No order type specified * Next appt:  11/20/2020   Dallyn Bergland L. Gerrell Tabet, D.O. [facility] [address], [place] [address] Cell Phone (Mon-Fri 8am-5pm):  [phone] On Call:  [phone] & follow prompts after 5pm & weekends Office Phone:  [phone] Office Fax:  [phone]

## 2020-09-06 ENCOUNTER — Ambulatory Visit: Payer: Self-pay | Admitting: Internal Medicine

## 2020-09-08 DIAGNOSIS — Z23 Encounter for immunization: Secondary | ICD-10-CM | POA: Diagnosis not present

## 2020-10-03 ENCOUNTER — Ambulatory Visit: Payer: Medicare Other | Admitting: Nurse Practitioner

## 2020-10-05 ENCOUNTER — Telehealth: Payer: Self-pay

## 2020-10-05 ENCOUNTER — Encounter: Payer: Self-pay | Admitting: Nurse Practitioner

## 2020-10-05 ENCOUNTER — Other Ambulatory Visit: Payer: Self-pay

## 2020-10-05 ENCOUNTER — Ambulatory Visit (INDEPENDENT_AMBULATORY_CARE_PROVIDER_SITE_OTHER): Payer: Medicare Other | Admitting: Nurse Practitioner

## 2020-10-05 DIAGNOSIS — Z1159 Encounter for screening for other viral diseases: Secondary | ICD-10-CM | POA: Diagnosis not present

## 2020-10-05 DIAGNOSIS — Z0000 Encounter for general adult medical examination without abnormal findings: Secondary | ICD-10-CM

## 2020-10-05 NOTE — Telephone Encounter (Signed)
Mr. Charles [name], Charles [name] are scheduled for a virtual visit with your provider today.    Just as we do with appointments in the office, we must obtain your consent to participate.  Your consent will be active for this visit and any virtual visit you may have with one of our providers in the next 365 days.    If you have a MyChart account, I can also send a copy of this consent to you electronically.  All virtual visits are billed to your insurance company just like a traditional visit in the office.  As this is a virtual visit, video technology does not allow for your provider to perform a traditional examination.  This may limit your provider's ability to fully assess your condition.  If your provider identifies any concerns that need to be evaluated in person or the need to arrange testing such as labs, EKG, etc, we will make arrangements to do so.    Although advances in technology are sophisticated, we cannot ensure that it will always work on either your end or our end.  If the connection with a video visit is poor, we may have to switch to a telephone visit.  With either a video or telephone visit, we are not always able to ensure that we have a secure connection.   I need to obtain your verbal consent now.   Are you willing to proceed with your visit today?   Charles [name] has provided verbal consent on 10/05/2020 for a virtual visit (video or telephone).   Charles [name], [facility] 10/05/2020  2:39 PM

## 2020-10-05 NOTE — Patient Instructions (Signed)
Charles [name] , Thank you for taking time to come for your Medicare Wellness Visit. I appreciate your ongoing commitment to your health goals. Please review the following plan we discussed and let me know if I can assist you in the future.   Screening recommendations/referrals: Colonoscopy aged out Recommended yearly ophthalmology/optometry visit for glaucoma screening and checkup Recommended yearly dental visit for hygiene and checkup  Vaccinations: Influenza vaccine up to date Pneumococcal vaccine recommended to get pneumonia 23 vaccine - can get at [facility] senior care.  Tdap vaccine up to date Shingles vaccine RECOMMENDED- to get shingrix at local pharmacy.     Advanced directives: recommended to complete and bring to office to place on file.   Conditions/risks identified: progressive memory loss, weight loss.   Next appointment: yearly.   Preventive Care 36 Years and Older, Male Preventive care refers to lifestyle choices and visits with your health care provider that can promote health and wellness. What does preventive care include?  A yearly physical exam. This is also called an annual well check.  Dental exams once or twice a year.  Routine eye exams. Ask your health care provider how often you should have your eyes checked.  Personal lifestyle choices, including:  Daily care of your teeth and gums.  Regular physical activity.  Eating a healthy diet.  Avoiding tobacco and drug use.  Limiting alcohol use.  Practicing safe sex.  Taking low doses of aspirin every day.  Taking vitamin and mineral supplements as recommended by your health care provider. What happens during an annual well check? The services and screenings done by your health care provider during your annual well check will depend on your age, overall health, lifestyle risk factors, and family history of disease. Counseling  Your health care provider may ask you questions about your:  Alcohol  use.  Tobacco use.  Drug use.  Emotional well-being.  Home and relationship well-being.  Sexual activity.  Eating habits.  History of falls.  Memory and ability to understand (cognition).  Work and work Astronomer. Screening  You may have the following tests or measurements:  Height, weight, and BMI.  Blood pressure.  Lipid and cholesterol levels. These may be checked every 5 years, or more frequently if you are over 15 years old.  Skin check.  Lung cancer screening. You may have this screening every year starting at age 17 if you have a 30-pack-year history of smoking and currently smoke or have quit within the past 15 years.  Fecal occult blood test (FOBT) of the stool. You may have this test every year starting at age 15.  Flexible sigmoidoscopy or colonoscopy. You may have a sigmoidoscopy every 5 years or a colonoscopy every 10 years starting at age 18.  Prostate cancer screening. Recommendations will vary depending on your family history and other risks.  Hepatitis C blood test.  Hepatitis B blood test.  Sexually transmitted disease (STD) testing.  Diabetes screening. This is done by checking your blood sugar (glucose) after you have not eaten for a while (fasting). You may have this done every 1-3 years.  Abdominal aortic aneurysm (AAA) screening. You may need this if you are a current or former smoker.  Osteoporosis. You may be screened starting at age 52 if you are at high risk. Talk with your health care provider about your test results, treatment options, and if necessary, the need for more tests. Vaccines  Your health care provider may recommend certain vaccines, such as:  Influenza  vaccine. This is recommended every year.  Tetanus, diphtheria, and acellular pertussis (Tdap, Td) vaccine. You may need a Td booster every 10 years.  Zoster vaccine. You may need this after age 64.  Pneumococcal 13-valent conjugate (PCV13) vaccine. One dose is  recommended after age 80.  Pneumococcal polysaccharide (PPSV23) vaccine. One dose is recommended after age 63. Talk to your health care provider about which screenings and vaccines you need and how often you need them. This information is not intended to replace advice given to you by your health care provider. Make sure you discuss any questions you have with your health care provider. Document Released: 10/13/2015 Document Revised: 06/05/2016 Document Reviewed: 07/18/2015 Elsevier Interactive Patient Education  2017 [place] Prevention in the Home Falls can cause injuries. They can happen to people of all ages. There are many things you can do to make your home safe and to help prevent falls. What can I do on the outside of my home?  Regularly fix the edges of walkways and driveways and fix any cracks.  Remove anything that might make you trip as you walk through a door, such as a raised step or threshold.  Trim any bushes or trees on the path to your home.  Use bright outdoor lighting.  Clear any walking paths of anything that might make someone trip, such as rocks or tools.  Regularly check to see if handrails are loose or broken. Make sure that both sides of any steps have handrails.  Any raised decks and porches should have guardrails on the edges.  Have any leaves, snow, or ice cleared regularly.  Use sand or salt on walking paths during winter.  Clean up any spills in your garage right away. This includes oil or grease spills. What can I do in the bathroom?  Use night lights.  Install grab bars by the toilet and in the tub and shower. Do not use towel bars as grab bars.  Use non-skid mats or decals in the tub or shower.  If you need to sit down in the shower, use a plastic, non-slip stool.  Keep the floor dry. Clean up any water that spills on the floor as soon as it happens.  Remove soap buildup in the tub or shower regularly.  Attach bath mats  securely with double-sided non-slip rug tape.  Do not have throw rugs and other things on the floor that can make you trip. What can I do in the bedroom?  Use night lights.  Make sure that you have a light by your bed that is easy to reach.  Do not use any sheets or blankets that are too big for your bed. They should not hang down onto the floor.  Have a firm chair that has side arms. You can use this for support while you get dressed.  Do not have throw rugs and other things on the floor that can make you trip. What can I do in the kitchen?  Clean up any spills right away.  Avoid walking on wet floors.  Keep items that you use a lot in easy-to-reach places.  If you need to reach something above you, use a strong step stool that has a grab bar.  Keep electrical cords out of the way.  Do not use floor polish or wax that makes floors slippery. If you must use wax, use non-skid floor wax.  Do not have throw rugs and other things on the floor that can  make you trip. What can I do with my stairs?  Do not leave any items on the stairs.  Make sure that there are handrails on both sides of the stairs and use them. Fix handrails that are broken or loose. Make sure that handrails are as long as the stairways.  Check any carpeting to make sure that it is firmly attached to the stairs. Fix any carpet that is loose or worn.  Avoid having throw rugs at the top or bottom of the stairs. If you do have throw rugs, attach them to the floor with carpet tape.  Make sure that you have a light switch at the top of the stairs and the bottom of the stairs. If you do not have them, ask someone to add them for you. What else can I do to help prevent falls?  Wear shoes that:  Do not have high heels.  Have rubber bottoms.  Are comfortable and fit you well.  Are closed at the toe. Do not wear sandals.  If you use a stepladder:  Make sure that it is fully opened. Do not climb a closed  stepladder.  Make sure that both sides of the stepladder are locked into place.  Ask someone to hold it for you, if possible.  Clearly mark and make sure that you can see:  Any grab bars or handrails.  First and last steps.  Where the edge of each step is.  Use tools that help you move around (mobility aids) if they are needed. These include:  Canes.  Walkers.  Scooters.  Crutches.  Turn on the lights when you go into a dark area. Replace any light bulbs as soon as they burn out.  Set up your furniture so you have a clear path. Avoid moving your furniture around.  If any of your floors are uneven, fix them.  If there are any pets around you, be aware of where they are.  Review your medicines with your doctor. Some medicines can make you feel dizzy. This can increase your chance of falling. Ask your doctor what other things that you can do to help prevent falls. This information is not intended to replace advice given to you by your health care provider. Make sure you discuss any questions you have with your health care provider. Document Released: 07/13/2009 Document Revised: 02/22/2016 Document Reviewed: 10/21/2014 Elsevier Interactive Patient Education  2017 [employer].

## 2020-10-05 NOTE — Progress Notes (Signed)
Subjective:   Charles [name] is a 80 y.o. male who presents for Medicare Annual/Subsequent preventive examination.  Review of Systems     Cardiac Risk Factors include: hypertension;advanced age (>78men, >36 women);male gender     Objective:    There were no vitals filed for this visit. There is no height or weight on file to calculate BMI.  Advanced Directives 10/05/2020 09/04/2020 07/24/2020 07/24/2020 04/27/2020 03/20/2020 11/11/2019  Does Patient Have a Medical Advance Directive? Yes Yes Yes Yes Yes Yes -  Type of Paramedic of [name];Living will [place];Living will - [place];Living will Living will;Healthcare Power of [place];Living will [place]  Does patient want to make changes to medical advance directive? No - Patient declined No - Patient declined No - Patient declined No - Patient declined No - Patient declined - No - Patient declined  Copy of [place] in Chart? No - copy requested No - copy requested No - copy requested No - copy requested - No - copy requested -    Current Medications (verified) Outpatient Encounter Medications as of 10/05/2020  Medication Sig  . acetaminophen (TYLENOL) 325 MG tablet Take 650 mg by mouth every 6 (six) hours as needed for mild pain.  Marland Kitchen amLODipine (NORVASC) 2.5 MG tablet Take 1 tablet (2.5 mg total) by mouth daily.  Marland Kitchen aspirin EC 81 MG tablet Take 81 mg by mouth daily.  . carbidopa-levodopa (SINEMET IR) 25-100 MG tablet Take 1 tablet by mouth 3 (three) times daily. (0830, 1330, & 2030)  . donepezil (ARICEPT) 10 MG tablet Take 10 mg by mouth daily.   Marland Kitchen escitalopram (LEXAPRO) 10 MG tablet Take 1 tablet (10 mg total) by mouth at bedtime.  Marland Kitchen L-Methylfolate-B12-B6-B2 (CEREFOLIN) 02-28-49-5 MG TABS Take 1 tablet by mouth daily.  . Melatonin-Pyridoxine (MELATIN PO) Take 3 mg by mouth at bedtime.  . mirtazapine  (REMERON) 15 MG tablet Take 15 mg by mouth at bedtime. 1/2 tablet at night time  . mometasone (ELOCON) 0.1 % cream Apply 1 application topically daily. To dry red scaly areas on face  . Multiple Vitamin (MULTIVITAMIN WITH MINERALS) TABS tablet Take 1 tablet by mouth daily.   No facility-administered encounter medications on file as of 10/05/2020.    Allergies (verified) Propofol   History: Past Medical History:  Diagnosis Date  . AC joint separation    Right shoulder  . Acute pericarditis 08/09/2018   Admitted 11/19 for pericarditis; ? Related to recent pacer implant; small eff on echo/no tamponade  . Anxiety   . Aortic regurgitation   . Complication of anesthesia   . 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 Depression   . Diastolic dysfunction   . Hypertension   . Left leg pain   . Microcytic anemia   . 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 SSS (sick sinus syndrome) ([place])    Past Surgical History:  Procedure Laterality Date  . acromioclavicular separation  07/04/2016  . CHEST TUBE INSERTION Left 08/05/2018  . EYE SURGERY    . HERNIA REPAIR    . LEAD REVISION/REPAIR N/A 08/10/2018   Procedure: LEAD REVISION/REPAIR;  Surgeon: Deboraha Sprang, MD;  Location: [facility];  Service: Cardiovascular;  Laterality: N/A;  . NM MYOVIEW LTD    . PACEMAKER IMPLANT N/A 08/05/2018   Procedure: [facility]  dual lead PACEMAKER IMPLANT;  Surgeon: Thurmon Fair, MD;  Location: [facility];  Service: Cardiovascular;  Laterality: N/A;  . Right ingunial hernia repair    . US ECHOCARDIOGRAPHY     Family History  Problem Relation Age of Onset  . Hypertension Mother   . Lung cancer Mother   . Diabetes Mother   . Thyroid disease Mother   . Angina Father   . Heart attack Father   . Heart disease Brother   . Diabetes Brother   . Heart attack Brother   . Asthma Maternal Grandmother   . Stroke Maternal Grandfather    . Diabetes Paternal Grandfather   . Heart Problems Brother   . Dementia Neg Hx    Social History   Socioeconomic History  . Marital status: Married    Spouse name: Not on file  . Number of children: 3  . Years of education: Not on file  . Highest education level: Associate degree: academic program  Occupational History  . Not on file  Tobacco Use  . Smoking status: Never Smoker  . Smokeless tobacco: Never Used  Vaping Use  . Vaping Use: Never used  Substance and Sexual Activity  . Alcohol use: No  . Drug use: No  . Sexual activity: Not on file  Other Topics Concern  . Not on file  Social History Narrative   Lives at home with his wife   Right handed   No caffeine    Social Determinants of Corporate investment banker Strain: Not on file  Food Insecurity: Not on file  Transportation Needs: Not on file  Physical Activity: Not on file  Stress: Not on file  Social Connections: Not on file    Tobacco Counseling Counseling given: Not Answered   Clinical Intake:  Pre-visit preparation completed: Yes  Pain : No/denies pain     BMI - recorded: 18 Nutritional Status: BMI <19  Underweight Nutritional Risks: Unintentional weight loss,Failure to thrive Diabetes: No  How often do you need to have someone help you when you read instructions, pamphlets, or other written materials from your doctor or pharmacy?: 1 - Never  Diabetic?no         Activities of Daily Living In your present state of health, do you have any difficulty performing the following activities: 10/05/2020  Hearing? N  Vision? N  Difficulty concentrating or making decisions? Y  Walking or climbing stairs? N  Dressing or bathing? N  Doing errands, shopping? N  Preparing Food and eating ? N  Using the Toilet? N  In the past six months, have you accidently leaked urine? N  Do you have problems with loss of bowel control? Y  Managing your Medications? Y  Comment wife helps manage  Managing  your Finances? N  Housekeeping or managing your Housekeeping? N  Some recent data might be hidden    Patient Care Team: Kermit Balo, DO as PCP - General (Geriatric Medicine) Croitoru, [name], MD as PCP - Cardiology (Cardiology)  Indicate any recent Medical Services you may have received from other than [facility] providers in the past year (date may be approximate).     Assessment:   This is a routine wellness examination for [place].  Hearing/Vision screen No exam data present  Dietary issues and exercise activities discussed:    Goals    . Patient Stated     Maintain current level of health      Depression Screen PHQ 2/9 Scores  10/05/2020 04/27/2020 03/20/2020 01/31/2020 11/11/2019 10/11/2019 05/20/2019  PHQ - 2 Score 0 0 0 0 0 0 0    Fall Risk Fall Risk  10/05/2020 09/04/2020 07/24/2020 04/27/2020 03/20/2020  Falls in the past year? 1 0 0 1 1  Number falls in past yr: 1 0 1 0 0  Injury with Fall? 1 0 0 1 1  Risk for fall due to : - - - - -  Follow up - - - - -    [place]:  Any stairs in or around the home? No  If so, are there any without handrails? No  Home free of loose throw rugs in walkways, pet beds, electrical cords, etc? Yes  Adequate lighting in your home to reduce risk of falls? Yes   ASSISTIVE DEVICES UTILIZED TO PREVENT FALLS:  Life alert? No  Use of a cane, walker or w/c? No  Grab bars in the bathroom? Yes  Shower chair or bench in shower? Yes  Elevated toilet seat or a handicapped toilet? Yes   TIMED UP AND GO:  Was the test performed? No .    Cognitive Function: MMSE - Mini Mental State Exam 04/10/2018 10/14/2017  Orientation to time 5 5  Orientation to Place 5 5  Registration 3 3  Attention/ Calculation 5 5  Recall 1 3  Language- name 2 objects 2 2  Language- repeat 1 1  Language- follow 3 step command 3 3  Language- read & follow direction 1 1  Write a sentence 1 1  Copy design 1 1  Total score 28 30    Montreal Cognitive Assessment  04/10/2018  Visuospatial/ Executive (0/5) 4  Naming (0/3) 2  Attention: Read list of digits (0/2) 2  Attention: Read list of letters (0/1) 1  Attention: Serial 7 subtraction starting at 100 (0/3) 3  Language: Repeat phrase (0/2) 1  Language : Fluency (0/1) 0  Abstraction (0/2) 2  Delayed Recall (0/5) 2  Orientation (0/6) 6  Total 23   6CIT Screen 10/05/2020  What Year? 0 points  What month? 0 points  What time? 0 points  Count back from 20 0 points  Months in reverse 0 points  Repeat phrase 0 points  Total Score 0    Immunizations Immunization History  Administered Date(s) Administered  . Fluad Quad(high Dose 65+) 05/20/2019, 07/24/2020  . Influenza-Unspecified 06/27/2011, 06/26/2012, 06/21/2013, 07/14/2014, 06/14/2015, 07/17/2016, 05/31/2017, 05/31/2018  . PFIZER SARS-COV-2 Vaccination 11/07/2019, 11/25/2019, 09/08/2020  . Pneumococcal Conjugate-13 08/09/2017  . Pneumococcal-Unspecified 03/22/2006  . Tdap 06/20/2009, 11/07/2019    TDAP status: Up to date  Flu Vaccine status: Up to date  Pneumococcal vaccine status: Up to date  Covid-19 vaccine status: Completed vaccines  Qualifies for Shingles Vaccine? Yes   Zostavax completed No   Shingrix Completed?: No.    Education has been provided regarding the importance of this vaccine. Patient has been advised to call insurance company to determine out of pocket expense if they have not yet received this vaccine. Advised may also receive vaccine at local pharmacy or Health Dept. Verbalized acceptance and understanding.  Screening Tests Health Maintenance  Topic Date Due  . Hepatitis C Screening  Never done  . PNA vac Low Risk Adult (2 of 2 - PPSV23) 08/09/2018  . TETANUS/TDAP  11/06/2029  . INFLUENZA VACCINE  Completed  . COVID-19 Vaccine  Completed    Health Maintenance  Health Maintenance Due  Topic Date Due  . Hepatitis C  Screening  Never done  . PNA vac Low Risk Adult (2 of 2 -  PPSV23) 08/09/2018    Colorectal cancer screening: No longer required.   Lung Cancer Screening: (Low Dose CT Chest recommended if Age 66-80 years, 30 pack-year currently smoking OR have quit w/in 15years.) does not qualify.   Lung Cancer Screening Referral: na  Additional Screening:  Hepatitis C Screening: does qualify; Complete with next labs   Vision Screening: Recommended annual ophthalmology exams for early detection of glaucoma and other disorders of the eye. Is the patient up to date with their annual eye exam?  Yes  Who is the provider or what is the name of the office in which the patient attends annual eye exams? [place] If pt is not established with a provider, would they like to be referred to a provider to establish care? No .   Dental Screening: Recommended annual dental exams for proper oral hygiene  Community Resource Referral / Chronic Care Management: CRR required this visit?  No   CCM required this visit?  No      Plan:     I have personally reviewed and noted the following in the patient's chart:   . Medical and social history . Use of alcohol, tobacco or illicit drugs  . Current medications and supplements . Functional ability and status . Nutritional status . Physical activity . Advanced directives . List of other physicians . Hospitalizations, surgeries, and ER visits in previous 12 months . Vitals . Screenings to include cognitive, depression, and falls . Referrals and appointments  In addition, I have reviewed and discussed with patient certain preventive protocols, quality metrics, and best practice recommendations. A written personalized care plan for preventive services as well as general preventive health recommendations were provided to patient.     Lauree Chandler, NP   10/05/2020    Virtual Visit via Telephone Note  I connected with@ on 10/05/20 at  2:45 PM EST by telephone and verified that I am speaking with the correct  person using two identifiers.  Location: Patient: home Provider: [facility]   I discussed the limitations, risks, security and privacy concerns of performing an evaluation and management service by telephone and the availability of in person appointments. I also discussed with the patient that there may be a patient responsible charge related to this service. The patient expressed understanding and agreed to proceed.   I discussed the assessment and treatment plan with the patient. The patient was provided an opportunity to ask questions and all were answered. The patient agreed with the plan and demonstrated an understanding of the instructions.   The patient was advised to call back or seek an in-person evaluation if the symptoms worsen or if the condition fails to improve as anticipated.  I provided 18 minutes of non-face-to-face time during this encounter.  Carlos American. Harle Battiest Avs printed and mailed

## 2020-10-05 NOTE — Progress Notes (Signed)
This service is provided via telemedicine  No vital signs collected/recorded due to the encounter was a telemedicine visit.   Location of patient (ex: home, work):  Home  Patient consents to a telephone visit:  Yes,see encounter dated 10/05/2020  Location of the provider (ex: office, home): [facility]  Name of any referring provider:  Bufford Spikes, DO  Names of all persons participating in the telemedicine service and their role in the encounter: Abbey Chatters, Nurse Practitioner, Elveria Royals, CMA, and patient.   Time spent on call:  11 minutes with medical assistant

## 2020-10-29 NOTE — Progress Notes (Signed)
Cardiology Office Note:    Date:  10/29/2020   ID:  Devang, Seccombe 1941-08-03, MRN [id number]  PCP:  Kermit Balo, DO  Cardiologist:  New Electrophysiologist:  None   Referring MD: Kermit Balo, DO   No chief complaint on file.    History of Present Illness:    Charles [name] is a 80 y.o. male with a hx of rapidly progressive dementia, hypertension, history of diastolic dysfunction and aortic insufficiency, Parkinson's and sinus node dysfunction with symptomatic bradycardia.  He has generally been doing well and has not had any further falls.  He denies problems with shortness of breath, chest pain, palpitations and has not experienced syncope or dizziness.  No leg edema.  His blood pressure is usually very well controlled at home, but was a little high today because he got excited with the change in schedule.  Mobility remains limited by Parkinson's disease and his activity is also limited by his dementia.  Had problems with orthostatic hypotension.  Although he remains extremely lean, actually malnourished, he has not lost any additional weight.  His BMI is now 16.7.  Pacemaker interrogation shows normal device function.  Estimated generator longevity is 10-11 years.  He has 80% atrial pacing at 60 bpm and almost never achieves higher heart rates.  Rate response was turned on.  It was noticed that with slightly faster heart rates there is an increased incidence of ventricular pacing so the paced AV delay was prolonged to 250 ms.  He has not had any episodes of atrial fibrillation and had a single very brief episode of paroxysmal atrial tachycardia.  No ventricular arrhythmia has been seen.  Lead parameters remain good.    He underwent implantation of a dual-chamber pacemaker (St Jude Assurity) in November 2019, complicated by lead perforation with pericarditis and pneumothorax.  However, he has recovered well from that procedure.  He seems to be more active and more  interactive according to his family ever since the pacemaker was implanted.     Past Medical History:  Diagnosis Date  . AC joint separation    Right shoulder  . Acute pericarditis 08/09/2018   Admitted 11/19 for pericarditis; ? Related to recent pacer implant; small eff on echo/no tamponade  . Anxiety   . Aortic regurgitation   . Complication of anesthesia   . Dementia, Lewy body with behavior disturbance (HCC)   . Depression   . Diastolic dysfunction   . Hypertension   . Left leg pain   . Microcytic anemia   . 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 SSS (sick sinus syndrome) (HCC)     Past Surgical History:  Procedure Laterality Date  . acromioclavicular separation  07/04/2016  . CHEST TUBE INSERTION Left 08/05/2018  . EYE SURGERY    . HERNIA REPAIR    . LEAD REVISION/REPAIR N/A 08/10/2018   Procedure: LEAD REVISION/REPAIR;  Surgeon: Duke Salvia, MD;  Location: [facility] INVASIVE CV LAB;  Service: Cardiovascular;  Laterality: N/A;  . NM MYOVIEW LTD    . PACEMAKER IMPLANT N/A 08/05/2018   Procedure: St Jude dual lead PACEMAKER IMPLANT;  Surgeon: Thurmon Fair, MD;  Location: [facility] INVASIVE CV LAB;  Service: Cardiovascular;  Laterality: N/A;  . Right ingunial hernia repair    . US ECHOCARDIOGRAPHY      Current Medications: No outpatient medications have been marked as taking for the 10/30/20 encounter (Appointment) with Thurmon Fair, MD.  Allergies:   Propofol   Social History   Socioeconomic History  . Marital status: Married    Spouse name: Not on file  . Number of children: 3  . Years of education: Not on file  . Highest education level: Associate degree: academic program  Occupational History  . Not on file  Tobacco Use  . Smoking status: Never Smoker  . Smokeless tobacco: Never Used  Vaping Use  . Vaping Use: Never used  Substance and Sexual Activity  . Alcohol use: No  . Drug use: No  . Sexual activity:  Not on file  Other Topics Concern  . Not on file  Social History Narrative   Lives at home with his wife   Right handed   No caffeine    Social Determinants of Corporate investment banker Strain: Not on file  Food Insecurity: Not on file  Transportation Needs: Not on file  Physical Activity: Not on file  Stress: Not on file  Social Connections: Not on file     Family History: The patient's family history includes Angina in his father; Asthma in his maternal grandmother; Diabetes in his brother, mother, and paternal grandfather; Heart Problems in his brother; Heart attack in his brother and father; Heart disease in his brother; Hypertension in his mother; Lung cancer in his mother; Stroke in his maternal grandfather; Thyroid disease in his mother. There is no history of Dementia.  ROS:   Please see the history of present illness.   All other systems are reviewed and are negative.   EKGs/Labs/Other Studies Reviewed:     EKG:  EKG is ordered today.  It shows atrial paced, ventricular sensed rhythm with voltage criteria alone for LVH (suspect this is because he is so slender).  QTc 376 ms without any secondary or ischemic repolarization change  Recent Labs: 03/16/2020: ALT 13; Hemoglobin 12.7; Platelets 227 04/28/2020: BUN 18; Creat 1.08; Potassium 4.3; Sodium 140  Recent Lipid Panel    Component Value Date/Time   CHOL 222 (H) 02/24/2019 0902   TRIG 88 02/24/2019 0902   HDL 118 02/24/2019 0902   CHOLHDL 1.9 02/24/2019 0902   LDLCALC 86 02/24/2019 0902    Physical Exam:    VS:  BP (!) 162/69   Pulse 61   Ht 5\' 10"  (1.778 m)   Wt 116 lb 12.8 oz (53 kg)   SpO2 97%   BMI 16.76 kg/m     Wt Readings from Last 3 Encounters:  09/04/20 122 lb 12.8 oz (55.7 kg)  07/24/20 122 lb 3.2 oz (55.4 kg)  06/06/20 122 lb 3.2 oz (55.4 kg)     General: Alert, oriented x3, no distress, underweight.  Well-healed pacemaker site Head: no evidence of trauma, PERRL, EOMI, no exophtalmos or  lid lag, no myxedema, no xanthelasma; normal ears, nose and oropharynx Neck: normal jugular venous pulsations and no hepatojugular reflux; brisk carotid pulses without delay and no carotid bruits Chest: clear to auscultation, no signs of consolidation by percussion or palpation, normal fremitus, symmetrical and full respiratory excursions Cardiovascular: normal position and quality of the apical impulse, regular rhythm, normal first and second heart sounds, no murmurs, rubs or gallops Abdomen: no tenderness or distention, no masses by palpation, no abnormal pulsatility or arterial bruits, normal bowel sounds, no hepatosplenomegaly Extremities: no clubbing, cyanosis or edema; 2+ radial, ulnar and brachial pulses bilaterally; 2+ right femoral, posterior tibial and dorsalis pedis pulses; 2+ left femoral, posterior tibial and dorsalis pedis pulses; no subclavian  or femoral bruits Neurological: grossly nonfocal Psych: Normal mood and affect   ASSESSMENT:    No diagnosis found. PLAN:    In order of problems listed above:  1. Sinus bradycardia/SSS: The heart rate histograms appear extremely blunted even allowing for his sedentary lifestyle. 2. PPM: Rate response was turned on today.  Paced AV delay extended to 250 ms to prevent ventricular pacing response to a pacing at higher heart rates.Normal device function.  Plan remote downloads every 3 months and yearly office visits. 3. HTN: Blood pressure is a little high today, but reportedly lower at home.  Blood pressure was recorded as 118/58 at an office visit in December.  He is at risk for orthostatic hypotension and further falls.  Will not give him any additional antihypertensive medications for the elevated blood pressure today. 4. Severe protein calorie malnutrition:, BMI only 16.  But it appears that his weight loss has stabilized. 5. Dementia: He was not particularly interactive today, but was alert and appeared to follow the  conversation.   Medication Adjustments/Labs and Tests Ordered: Current medicines are reviewed at length with the patient today.  Concerns regarding medicines are outlined above.  No orders of the defined types were placed in this encounter.  No orders of the defined types were placed in this encounter.   There are no Patient Instructions on file for this visit.   Signed, Thurmon Fair, MD  10/29/2020 4:58 PM    [facility]

## 2020-10-30 ENCOUNTER — Ambulatory Visit (INDEPENDENT_AMBULATORY_CARE_PROVIDER_SITE_OTHER): Payer: Medicare Other | Admitting: Cardiovascular Disease

## 2020-10-30 ENCOUNTER — Other Ambulatory Visit: Payer: Self-pay

## 2020-10-30 ENCOUNTER — Encounter: Payer: Self-pay | Admitting: Cardiovascular Disease

## 2020-10-30 VITALS — BP 162/69 | HR 61 | Ht 70.0 in | Wt 116.8 lb

## 2020-10-30 DIAGNOSIS — G3183 Dementia with Lewy bodies: Secondary | ICD-10-CM

## 2020-10-30 DIAGNOSIS — I10 Essential (primary) hypertension: Secondary | ICD-10-CM | POA: Diagnosis not present

## 2020-10-30 DIAGNOSIS — F0280 Dementia in other diseases classified elsewhere without behavioral disturbance: Secondary | ICD-10-CM

## 2020-10-30 DIAGNOSIS — I495 Sick sinus syndrome: Secondary | ICD-10-CM | POA: Diagnosis not present

## 2020-10-30 DIAGNOSIS — E43 Unspecified severe protein-calorie malnutrition: Secondary | ICD-10-CM

## 2020-10-30 DIAGNOSIS — Z950 Presence of cardiac pacemaker: Secondary | ICD-10-CM | POA: Diagnosis not present

## 2020-10-30 NOTE — Patient Instructions (Signed)

## 2020-11-07 ENCOUNTER — Ambulatory Visit (INDEPENDENT_AMBULATORY_CARE_PROVIDER_SITE_OTHER): Payer: Medicare Other

## 2020-11-07 DIAGNOSIS — I495 Sick sinus syndrome: Secondary | ICD-10-CM

## 2020-11-07 LAB — CUP PACEART REMOTE DEVICE CHECK
Battery Remaining Longevity: 126 mo
Battery Remaining Percentage: 95.5 %
Battery Voltage: 3.01 V
Brady Statistic AP VP Percent: 1 %
Brady Statistic AP VS Percent: 92 %
Brady Statistic AS VP Percent: 1 %
Brady Statistic AS VS Percent: 7.8 %
Brady Statistic RA Percent Paced: 92 %
Brady Statistic RV Percent Paced: 1 %
Date Time Interrogation Session: 20220208020020
Implantable Lead Implant Date: 20191106
Implantable Lead Implant Date: 20191111
Implantable Lead Location: 753859
Implantable Lead Location: 753860
Implantable Lead Model: 1948
Implantable Pulse Generator Implant Date: 20191106
Lead Channel Impedance Value: 400 Ohm
Lead Channel Impedance Value: 610 Ohm
Lead Channel Pacing Threshold Amplitude: 0.5 V
Lead Channel Pacing Threshold Amplitude: 0.75 V
Lead Channel Pacing Threshold Pulse Width: 0.5 ms
Lead Channel Pacing Threshold Pulse Width: 0.5 ms
Lead Channel Sensing Intrinsic Amplitude: 1 mV
Lead Channel Sensing Intrinsic Amplitude: 3.4 mV
Lead Channel Setting Pacing Amplitude: 1 V
Lead Channel Setting Pacing Amplitude: 1.5 V
Lead Channel Setting Pacing Pulse Width: 0.5 ms
Lead Channel Setting Sensing Sensitivity: 0.5 mV
Pulse Gen Model: 2272
Pulse Gen Serial Number: 9083517

## 2020-11-13 NOTE — Progress Notes (Signed)
Remote pacemaker transmission.   

## 2020-11-20 ENCOUNTER — Other Ambulatory Visit: Payer: Medicare Other

## 2020-11-20 ENCOUNTER — Encounter: Payer: Self-pay | Admitting: Internal Medicine

## 2020-11-20 ENCOUNTER — Other Ambulatory Visit: Payer: Self-pay

## 2020-11-20 DIAGNOSIS — R636 Underweight: Secondary | ICD-10-CM

## 2020-11-20 DIAGNOSIS — Z1159 Encounter for screening for other viral diseases: Secondary | ICD-10-CM

## 2020-11-21 LAB — CBC WITH DIFFERENTIAL/PLATELET
Absolute Monocytes: 473 cells/uL (ref 200–950)
Basophils Absolute: 108 cells/uL (ref 0–200)
Basophils Relative: 1.9 %
Eosinophils Absolute: 342 cells/uL (ref 15–500)
Eosinophils Relative: 6 %
HCT: 41.1 % (ref 38.5–50.0)
Hemoglobin: 13.9 g/dL (ref 13.2–17.1)
Lymphs Abs: 1853 cells/uL (ref 850–3900)
MCH: 31.3 pg (ref 27.0–33.0)
MCHC: 33.8 g/dL (ref 32.0–36.0)
MCV: 92.6 fL (ref 80.0–100.0)
MPV: 11.2 fL (ref 7.5–12.5)
Monocytes Relative: 8.3 %
Neutro Abs: 2924 cells/uL (ref 1500–7800)
Neutrophils Relative %: 51.3 %
Platelets: 221 10*3/uL (ref 140–400)
RBC: 4.44 10*6/uL (ref 4.20–5.80)
RDW: 12.9 % (ref 11.0–15.0)
Total Lymphocyte: 32.5 %
WBC: 5.7 10*3/uL (ref 3.8–10.8)

## 2020-11-21 LAB — BASIC METABOLIC PANEL
BUN: 17 mg/dL (ref 7–25)
CO2: 36 mmol/L — ABNORMAL HIGH (ref 20–32)
Calcium: 9.8 mg/dL (ref 8.6–10.3)
Chloride: 101 mmol/L (ref 98–110)
Creat: 0.99 mg/dL (ref 0.70–1.18)
Glucose, Bld: 98 mg/dL (ref 65–99)
Potassium: 4.8 mmol/L (ref 3.5–5.3)
Sodium: 143 mmol/L (ref 135–146)

## 2020-11-21 LAB — HEPATITIS C ANTIBODY
Hepatitis C Ab: NONREACTIVE
SIGNAL TO CUT-OFF: 0.01 (ref <1.00)

## 2020-11-21 NOTE — Progress Notes (Signed)
Blood counts, electrolytes, kidneys all ok Hepatitis C screen was negative.

## 2020-11-23 ENCOUNTER — Encounter: Payer: Self-pay | Admitting: Internal Medicine

## 2020-11-23 ENCOUNTER — Other Ambulatory Visit: Payer: Self-pay

## 2020-11-23 ENCOUNTER — Ambulatory Visit (INDEPENDENT_AMBULATORY_CARE_PROVIDER_SITE_OTHER): Payer: Medicare Other | Admitting: Internal Medicine

## 2020-11-23 VITALS — BP 118/70 | HR 66 | Temp 96.8°F | Ht 70.0 in | Wt 119.0 lb

## 2020-11-23 DIAGNOSIS — R2681 Unsteadiness on feet: Secondary | ICD-10-CM | POA: Diagnosis not present

## 2020-11-23 DIAGNOSIS — F324 Major depressive disorder, single episode, in partial remission: Secondary | ICD-10-CM | POA: Insufficient documentation

## 2020-11-23 DIAGNOSIS — I5032 Chronic diastolic (congestive) heart failure: Secondary | ICD-10-CM | POA: Diagnosis not present

## 2020-11-23 DIAGNOSIS — Z681 Body mass index (BMI) 19 or less, adult: Secondary | ICD-10-CM | POA: Diagnosis not present

## 2020-11-23 DIAGNOSIS — R636 Underweight: Secondary | ICD-10-CM

## 2020-11-23 DIAGNOSIS — Z23 Encounter for immunization: Secondary | ICD-10-CM

## 2020-11-23 DIAGNOSIS — F0280 Dementia in other diseases classified elsewhere without behavioral disturbance: Secondary | ICD-10-CM

## 2020-11-23 DIAGNOSIS — G3183 Dementia with Lewy bodies: Secondary | ICD-10-CM | POA: Diagnosis not present

## 2020-11-23 NOTE — Addendum Note (Signed)
Addended by: Logan Bores on: 11/23/2020 03:43 PM   Modules accepted: Orders

## 2020-11-23 NOTE — Progress Notes (Signed)
Location:  [facility] clinic Provider:  Tramon Crescenzo L. Mariea Clonts, D.O., C.M.D.  Code Status: DNR Goals of Care:  Advanced Directives 11/23/2020  Does Patient Have a Medical Advance Directive? Yes  Type of Paramedic of [place];Living will  Does patient want to make changes to medical advance directive? No - Patient declined  Copy of [place] in Chart? No - copy requested     No chief complaint on file.   HPI: Patient is a 80 y.o. male seen today for medical management of chronic diseases.  He has Lewy Body Dementia.    Weight is up 2 lbs.  Still trying to get more calories in.  He won't eat his snacks b/w meals.  Eats ok at the meals.    Sometimes has a little upset stomach from meds.  [name] tries to get him to eat a cookie or some boost after the meds b/c they're b/w meals.    No falls.  Getting around ok.  Sleeping ok.  Mood is ok.    Past Medical History:  Diagnosis Date  . AC joint separation    Right shoulder  . Acute pericarditis 08/09/2018   Admitted 11/19 for pericarditis; ? Related to recent pacer implant; small eff on echo/no tamponade  . Anxiety   . Aortic regurgitation   . Complication of anesthesia   . Dementia, Lewy body with behavior disturbance ([place])   . Depression   . Diastolic dysfunction   . Hypertension   . Left leg pain   . Microcytic anemia   . 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 SSS (sick sinus syndrome) ([place])     Past Surgical History:  Procedure Laterality Date  . acromioclavicular separation  07/04/2016  . CHEST TUBE INSERTION Left 08/05/2018  . EYE SURGERY    . HERNIA REPAIR    . LEAD REVISION/REPAIR N/A 08/10/2018   Procedure: LEAD REVISION/REPAIR;  Surgeon: Deboraha Sprang, MD;  Location: [place] CV LAB;  Service: Cardiovascular;  Laterality: N/A;  . NM MYOVIEW LTD    . PACEMAKER IMPLANT N/A 08/05/2018   Procedure: St Jude dual lead PACEMAKER IMPLANT;   Surgeon: Sanda Klein, MD;  Location: [place] CV LAB;  Service: Cardiovascular;  Laterality: N/A;  . Right ingunial hernia repair    . US ECHOCARDIOGRAPHY      Allergies  Allergen Reactions  . Propofol Anaphylaxis    Outpatient Encounter Medications as of 11/23/2020  Medication Sig  . acetaminophen (TYLENOL) 325 MG tablet Take 650 mg by mouth every 6 (six) hours as needed for mild pain.  Marland Kitchen amLODipine (NORVASC) 2.5 MG tablet Take 1 tablet (2.5 mg total) by mouth daily.  Marland Kitchen aspirin EC 81 MG tablet Take 81 mg by mouth daily.  . carbidopa-levodopa (SINEMET IR) 25-100 MG tablet Take 1 tablet by mouth 3 (three) times daily. (0830, 1330, & 2030)  . donepezil (ARICEPT) 10 MG tablet Take 10 mg by mouth daily.   Marland Kitchen escitalopram (LEXAPRO) 10 MG tablet Take 1 tablet (10 mg total) by mouth at bedtime.  Marland Kitchen L-Methylfolate-B12-B6-B2 (CEREFOLIN) 02-28-49-5 MG TABS Take 1 tablet by mouth daily.  . Melatonin-Pyridoxine (MELATIN PO) Take 3 mg by mouth at bedtime.  . mirtazapine (REMERON) 15 MG tablet Take 7.5 mg by mouth at bedtime.  . mometasone (ELOCON) 0.1 % cream Apply 1 application topically daily. To dry red scaly areas on face  . Multiple Vitamin (MULTIVITAMIN WITH  MINERALS) TABS tablet Take 1 tablet by mouth daily.   No facility-administered encounter medications on file as of 11/23/2020.    Review of Systems:  Review of Systems  Constitutional: Negative for chills and fever.  HENT: Negative for congestion and sore throat.   Eyes: Negative for blurred vision.  Respiratory: Negative for cough and shortness of breath.   Cardiovascular: Negative for chest pain and leg swelling.  Gastrointestinal: Negative for abdominal pain, blood in stool, constipation, diarrhea and melena.  Genitourinary: Negative for dysuria.  Musculoskeletal: Negative for back pain, falls and joint pain.       Unsteady gait  Neurological: Negative for dizziness and loss of consciousness.  Endo/Heme/Allergies:  Bruises/bleeds easily.  Psychiatric/Behavioral: Positive for memory loss. Negative for depression. The patient is not nervous/anxious and does not have insomnia.     Health Maintenance  Topic Date Due  . PNA vac Low Risk Adult (2 of 2 - PPSV23) 08/09/2018  . TETANUS/TDAP  11/06/2029  . INFLUENZA VACCINE  Completed  . COVID-19 Vaccine  Completed  . Hepatitis C Screening  Completed    Physical Exam: Vitals:   11/23/20 1413  BP: 118/70  Pulse: 66  Temp: (!) 96.8 F (36 C)  TempSrc: Temporal  SpO2: 98%  Weight: 119 lb (54 kg)  Height: 5\' 10"  (1.778 m)   Body mass index is 17.07 kg/m. Physical Exam Vitals reviewed.  Constitutional:      Appearance: Normal appearance.  Eyes:     Extraocular Movements: Extraocular movements intact.     Conjunctiva/sclera: Conjunctivae normal.     Pupils: Pupils are equal, round, and reactive to light.  Cardiovascular:     Rate and Rhythm: Normal rate and regular rhythm.     Pulses: Normal pulses.     Heart sounds: Normal heart sounds.  Pulmonary:     Effort: Pulmonary effort is normal.     Breath sounds: Normal breath sounds. No wheezing, rhonchi or rales.  Abdominal:     General: Bowel sounds are normal.     Palpations: Abdomen is soft.     Tenderness: There is no abdominal tenderness. There is no guarding or rebound.  Musculoskeletal:        General: Normal range of motion.     Right lower leg: No edema.     Left lower leg: No edema.  Skin:    General: Skin is warm and dry.  Neurological:     General: No focal deficit present.     Mental Status: He is alert.     Motor: No weakness.     Gait: Gait abnormal.     Comments: Masked facies  Psychiatric:        Mood and Affect: Mood normal.        Behavior: Behavior normal.     Labs reviewed: Basic Metabolic Panel: Recent Labs    03/16/20 0843 04/28/20 0920 11/20/20 0948  NA 142 140 143  K 4.6 4.3 4.8  CL 101 101 101  CO2 36* 32 36*  GLUCOSE 91 90 98  BUN 14 18 17    CREATININE 1.02 1.08 0.99  CALCIUM 9.5 9.3 9.8   Liver Function Tests: Recent Labs    03/16/20 0843  AST 22  ALT 13  BILITOT 1.0  PROT 6.5   No results for input(s): LIPASE, AMYLASE in the last 8760 hours. No results for input(s): AMMONIA in the last 8760 hours. CBC: Recent Labs    03/16/20 0843 11/20/20 0948  WBC 10.3  5.7  NEUTROABS 6,623 2,924  HGB 12.7* 13.9  HCT 38.0* 41.1  MCV 92.2 92.6  PLT 227 221   Lipid Panel: No results for input(s): CHOL, HDL, LDLCALC, TRIG, CHOLHDL, LDLDIRECT in the last 8760 hours. No results found for: HGBA1C  Procedures since last visit: CUP [place]  Result Date: 11/07/2020 Scheduled remote reviewed. Normal device function.  Next remote 91 days- JBox, RN/CVRS   Assessment/Plan 1. Lewy body dementia without behavioral disturbance ([place]) -continue sinemet therapy tid -recommended having snack after taking to help prevent nausea  2. Need for vaccination against Streptococcus pneumoniae -pneumovax given today  3. Underweight -ongoing, but did gain 2 lbs with more snacks and higher calorie and higher protein foods per RD--has f/u  4. Unsteady gait when walking -continue fall precautions  5. Body mass index (BMI) of 19 or less in adult -continued to encourage frequent snacks and higher calorie foods -RD f/u  6. Chronic diastolic CHF (congestive heart failure) (HCC) -not on regular diuretics, doing fine here w/o signs of volume overload   7. Depression, major, single episode, in partial remission ([place]) -cont lexapro therapy and remeron  Labs/tests ordered:   Lab Orders  No laboratory test(s) ordered today  Next appt:  4 mos   Felcia Huebert L. Jamorion Gomillion, D.O. [facility] [address], [place] [address] Cell Phone (Mon-Fri 8am-5pm):  [phone] On Call:  [phone] & follow prompts after 5pm & weekends Office Phone:  [phone] Office Fax:   [phone]

## 2020-12-04 ENCOUNTER — Telehealth: Payer: Self-pay | Admitting: Nutrition

## 2020-12-04 NOTE — Telephone Encounter (Signed)
Mrs Charles [name] called and informed her the visit tomorrow will be in the office and not over the phone. She verbalized understanding.

## 2020-12-05 ENCOUNTER — Telehealth: Payer: Self-pay | Admitting: Internal Medicine

## 2020-12-05 ENCOUNTER — Encounter: Payer: Self-pay | Admitting: Nutrition

## 2020-12-05 ENCOUNTER — Encounter: Payer: Medicare Other | Attending: Internal Medicine | Admitting: Nutrition

## 2020-12-05 ENCOUNTER — Other Ambulatory Visit: Payer: Self-pay

## 2020-12-05 VITALS — Ht 69.0 in | Wt 116.4 lb

## 2020-12-05 DIAGNOSIS — G3183 Dementia with Lewy bodies: Secondary | ICD-10-CM | POA: Insufficient documentation

## 2020-12-05 DIAGNOSIS — Z681 Body mass index (BMI) 19 or less, adult: Secondary | ICD-10-CM

## 2020-12-05 DIAGNOSIS — R634 Abnormal weight loss: Secondary | ICD-10-CM

## 2020-12-05 DIAGNOSIS — R636 Underweight: Secondary | ICD-10-CM

## 2020-12-05 DIAGNOSIS — F0280 Dementia in other diseases classified elsewhere without behavioral disturbance: Secondary | ICD-10-CM | POA: Insufficient documentation

## 2020-12-05 NOTE — Progress Notes (Signed)
  Medical Nutrition Therapy:  Appt start time: 1430 end time:  1500   Assessment:  Primary concerns today: Weigh loss, underweight, Lewy Body Dementia. Here with his wife.   Bowls and urine ok.  No dizzy, weak or unstable.     His wife notes he is eating more and eating better.  He notes he is eating well. His wife notes he is eating more of a variety of foods now and small snacks. Lost 1 lb since last visit.. Drinking supplements to support his calories. Doing much better.    His wife is using some of the high calorie high protein recipes given last visit.    Wt Readings from Last 3 Encounters:  11/23/20 119 lb (54 kg)  10/30/20 116 lb 12.8 oz (53 kg)  09/04/20 122 lb 12.8 oz (55.7 kg)   Ht Readings from Last 3 Encounters:  11/23/20 5\' 10"  (1.778 m)  10/30/20 5\' 10"  (1.778 m)  09/04/20 5\' 9"  (1.753 m)    Preferred Learning Style:   No preference indicated   Learning Readiness:   Change in progress  MEDICATIONS:    DIETARY INTAKE:   24-hr recall:  B ( AM): boiled egg, candian , english muffin, coffee Snk ( AM):  L ( PM): ham sandwich, on white bread, mayo, chips, Pepsi Snk ( PM): sometimes  fig bar and cookies. D ( PM):Baked potato, pork cho, Coke Snk ( PM): Boost occasionally, Cookies   Beverages: water, pepsi  Usual physical activity:  ADL   Estimated energy needs: 2000  calories 225 g carbohydrates 150 g protein 56 g fat  Progress Towards Goal(s):  In progress.   Nutritional Diagnosis:  NB-1.1 Food and nutrition-related knowledge deficit As related to Underweight and weight loss.  As evidenced by BMI 16.    Intervention:  High Calorie High Protein diet. Healthy snacks. Goal Add a small snack between meals; pudding, cottage and fruit, banana pudding.  Add 2 Boosts per day Eat high calorie snacks of pudding Drink 3-4 bottles per day of water Regain 3 lbs by next visit.   Teaching Method Utilized:  Visual Auditory Hands on  Handouts given  during visit include:  High Calorie High Protein Diet  Ways to increase calories   Barriers to learning/adherence to lifestyle change: none  Demonstrated degree of understanding via:  Teach Back   Monitoring/Evaluation:  Dietary intake, exercise,  and body weight in 6 months.).

## 2020-12-05 NOTE — Telephone Encounter (Signed)
Charles [name] with [facility] called to say that referral for Charles [name] has expired & would like another referral to cover appt scheduled for 12/05/20/   Thanks, Vilinda Blanks

## 2020-12-05 NOTE — Telephone Encounter (Signed)
Referral entered.  I hope I did it properly.  I've never had to put in a follow-up one before.

## 2020-12-05 NOTE — Patient Instructions (Addendum)
Goals  Add a small snack between meals; pudding, cottage and fruit, banana pudding.  Add 2 Boosts per day Eat high calorie snacks of pudding Drink 3-4 bottles per day of water Regain 3 lbs by next visit.

## 2020-12-25 ENCOUNTER — Telehealth: Payer: Self-pay | Admitting: Nutrition

## 2020-12-25 ENCOUNTER — Encounter: Payer: Self-pay | Admitting: Family

## 2020-12-25 ENCOUNTER — Ambulatory Visit (INDEPENDENT_AMBULATORY_CARE_PROVIDER_SITE_OTHER): Payer: Medicare Other | Admitting: Family

## 2020-12-25 ENCOUNTER — Other Ambulatory Visit: Payer: Self-pay

## 2020-12-25 VITALS — BP 98/60 | HR 60 | Temp 97.1°F | Resp 16 | Ht 69.0 in | Wt 117.8 lb

## 2020-12-25 DIAGNOSIS — K591 Functional diarrhea: Secondary | ICD-10-CM | POA: Diagnosis not present

## 2020-12-25 DIAGNOSIS — R634 Abnormal weight loss: Secondary | ICD-10-CM | POA: Diagnosis not present

## 2020-12-25 NOTE — Patient Instructions (Signed)
- Increase water intake to at least 6-8 glasses daily  - please schedule follow up visit with Neurologist to evaluate Aricept due to weight loss   Diarrhea, Adult Diarrhea is when you pass loose and watery poop (stool) often. Diarrhea can make you feel weak and cause you to lose water in your body (get dehydrated). Losing water in your body can cause you to:  Feel tired and thirsty.  Have a dry mouth.  Go pee (urinate) less often. Diarrhea often lasts 2-3 days. However, it can last longer if it is a sign of something more serious. It is important to treat your diarrhea as told by your doctor. Follow these instructions at home: Eating and drinking Follow these instructions as told by your doctor:  Take an ORS (oral rehydration solution). This is a drink that helps you replace fluids and minerals your body lost. It is sold at pharmacies and stores.  Drink plenty of fluids, such as: ? Water. ? Ice chips. ? Diluted fruit juice. ? Low-calorie sports drinks. ? Milk, if you want.  Avoid drinking fluids that have a lot of sugar or caffeine in them.  Eat bland, easy-to-digest foods in small amounts as you are able. These foods include: ? Bananas. ? Applesauce. ? Rice. ? Low-fat (lean) meats. ? Toast. ? Crackers.  Avoid alcohol.  Avoid spicy or fatty foods.      Medicines  Take over-the-counter and prescription medicines only as told by your doctor.  If you were prescribed an antibiotic medicine, take it as told by your doctor. Do not stop using the antibiotic even if you start to feel better. General instructions  Wash your hands often using soap and water. If soap and water are not available, use a hand sanitizer. Others in your home should wash their hands as well. Hands should be washed: ? After using the toilet or changing a diaper. ? Before preparing, cooking, or serving food. ? While caring for a sick person. ? While visiting someone in a hospital.  Drink enough  fluid to keep your pee (urine) pale yellow.  Rest at home while you get better.  Watch your condition for any changes.  Take a warm bath to help with any burning or pain from having diarrhea.  Keep all follow-up visits as told by your doctor. This is important.   Contact a doctor if:  You have a fever.  Your diarrhea gets worse.  You have new symptoms.  You cannot keep fluids down.  You feel light-headed or dizzy.  You have a headache.  You have muscle cramps. Get help right away if:  You have chest pain.  You feel very weak or you pass out (faint).  You have bloody or black poop or poop that looks like tar.  You have very bad pain, cramping, or bloating in your belly (abdomen).  You have trouble breathing or you are breathing very quickly.  Your heart is beating very quickly.  Your skin feels cold and clammy.  You feel confused.  You have signs of losing too much water in your body, such as: ? Dark pee, very little pee, or no pee. ? Cracked lips. ? Dry mouth. ? Sunken eyes. ? Sleepiness. ? Weakness. Summary  Diarrhea is when you pass loose and watery poop (stool) often.  Diarrhea can make you feel weak and cause you to lose water in your body (get dehydrated).  Take an ORS (oral rehydration solution). This is a drink that is  sold at pharmacies and stores.  Eat bland, easy-to-digest foods in small amounts as you are able.  Contact a doctor if your condition gets worse. Get help right away if you have signs that you have lost too much water in your body. This information is not intended to replace advice given to you by your health care provider. Make sure you discuss any questions you have with your health care provider. Document Revised: 02/20/2018 Document Reviewed: 02/20/2018 Elsevier Patient Education  2021 [employer].

## 2020-12-25 NOTE — Progress Notes (Signed)
Provider:   FNP-C  Wardell Honour, MD  Patient Care Team: Wardell Honour, MD as PCP - General (Family Medicine) Sanda Klein, MD as PCP - Cardiology (Cardiology)  Extended Emergency Contact Information Primary Emergency Contact: Nolde,[name] H Address: [place], [place] [address] Johnnette Litter of [place] Phone: [phone] Mobile Phone: [phone] Relation: Spouse Secondary Emergency Contact: Slusher, [place] Phone: [phone] Mobile Phone: [phone] Relation: Daughter  Code Status: Full Code  Goals of care: Advanced Directive information Advanced Directives 12/25/2020  Does Patient Have a Medical Advance Directive? Yes  Type of Paramedic of [place];Living will  Does patient want to make changes to medical advance directive? No - Patient declined  Copy of [place] in Chart? No - copy requested     Chief Complaint  Patient presents with  . Acute Visit    Complains of continuous weight loss.    HPI:  Pt is a 80 y.o. male seen today for an acute visit for evaluation of continuous weight loss.He is here with wife who provides additional HPI.she reports patient has diarrhea on and off.Had a bout episode for the past last two weeks.Has used imodium though wife states does not give full dose to prevent from getting constipated.He denies any fever,chills,Abdominal pain,cramping ,nausea or vomiting.No aggravating factors.States sometimes has appetite others times just eats small amounts and feels full.Has Ensure that drinks during meals but wife states refuses due to being full.  Had oatmeal,bacon,biscuit with jelly and coffee this morning for breakfast. Does not snack.  Not drink enough fluid either.  Wife states used to weigh 120's in December,2021 has had progressive weight loss recent weihgt at home :  Wt 116.2 lbs (12/18/2020 ) Wt 114 lbs (  12/19/2020)  Weight at the office on previous visit was 116 lbs ( 12/05/2020) and wt here today was 117.8 lbs though wife dispute weight due to clothes and shoes.  She is on Remeron 7.5 mg tablet daily at bedtime for appetite but also on Aricept 10 mg tablet daily which could be contributing to his anorexia and weight loss and diarrhea.wife would like Neurologist to manage Aricept.she will call to schedule annual follow up appointment.     Past Medical History:  Diagnosis Date  . AC joint separation    Right shoulder  . Acute pericarditis 08/09/2018   Admitted 11/19 for pericarditis; ? Related to recent pacer implant; small eff on echo/no tamponade  . Anxiety   . Aortic regurgitation   . Complication of anesthesia   . Dementia, Lewy body with behavior disturbance ([place])   . Depression   . Diastolic dysfunction   . Hypertension   . Left leg pain   . Microcytic anemia   . 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 SSS (sick sinus syndrome) ([place])    Past Surgical History:  Procedure Laterality Date  . acromioclavicular separation  07/04/2016  . CHEST TUBE INSERTION Left 08/05/2018  . EYE SURGERY    . HERNIA REPAIR    . LEAD REVISION/REPAIR N/A 08/10/2018   Procedure: LEAD REVISION/REPAIR;  Surgeon: Deboraha Sprang, MD;  Location: [facility];  Service: Cardiovascular;  Laterality: N/A;  . NM MYOVIEW LTD    . PACEMAKER IMPLANT N/A 08/05/2018   Procedure: St Jude dual lead PACEMAKER IMPLANT;  Surgeon: Sanda Klein,  MD;  Location: [facility];  Service: Cardiovascular;  Laterality: N/A;  . Right ingunial hernia repair    . US ECHOCARDIOGRAPHY      Allergies  Allergen Reactions  . Propofol Anaphylaxis    Outpatient Encounter Medications as of 12/25/2020  Medication Sig  . acetaminophen (TYLENOL) 325 MG tablet Take 650 mg by mouth every 6 (six) hours as needed for mild pain.  Marland Kitchen amLODipine (NORVASC) 2.5 MG tablet Take 1  tablet (2.5 mg total) by mouth daily.  Marland Kitchen aspirin EC 81 MG tablet Take 81 mg by mouth daily.  . carbidopa-levodopa (SINEMET IR) 25-100 MG tablet Take 1 tablet by mouth 3 (three) times daily. (0830, 1330, & 2030)  . donepezil (ARICEPT) 10 MG tablet Take 10 mg by mouth daily.   Marland Kitchen escitalopram (LEXAPRO) 10 MG tablet Take 1 tablet (10 mg total) by mouth at bedtime.  Marland Kitchen L-Methylfolate-B12-B6-B2 (CEREFOLIN) 02-28-49-5 MG TABS Take 1 tablet by mouth daily.  . Melatonin-Pyridoxine (MELATIN PO) Take 3 mg by mouth at bedtime.  . mirtazapine (REMERON) 15 MG tablet Take 7.5 mg by mouth at bedtime.  . mometasone (ELOCON) 0.1 % cream Apply 1 application topically daily. To dry red scaly areas on face  . Multiple Vitamin (MULTIVITAMIN WITH MINERALS) TABS tablet Take 1 tablet by mouth daily.   No facility-administered encounter medications on file as of 12/25/2020.    Review of Systems  Constitutional: Positive for appetite change and unexpected weight change. Negative for chills, fatigue and fever.  HENT: Negative for congestion, rhinorrhea, sinus pressure, sinus pain, sneezing, sore throat and trouble swallowing.   Respiratory: Negative for cough, choking, chest tightness, shortness of breath and wheezing.   Cardiovascular: Negative for chest pain, palpitations and leg swelling.  Gastrointestinal: Negative for abdominal distention, abdominal pain, blood in stool, constipation, nausea, rectal pain and vomiting.       Diarrhea per HPI no diarrhea currently.   Endocrine: Negative for cold intolerance, heat intolerance, polydipsia, polyphagia and polyuria.  Genitourinary: Negative for difficulty urinating, dysuria, enuresis, flank pain, frequency and urgency.  Musculoskeletal: Negative for arthralgias, gait problem, joint swelling and myalgias.  Skin: Negative for color change, pallor and rash.  Neurological: Negative for dizziness, speech difficulty, weakness, light-headedness, numbness and headaches.   Hematological: Does not bruise/bleed easily.  Psychiatric/Behavioral: Negative for agitation, behavioral problems and sleep disturbance. The patient is not nervous/anxious.     Immunization History  Administered Date(s) Administered  . Fluad Quad(high Dose 65+) 05/20/2019, 07/24/2020  . Influenza-Unspecified 06/27/2011, 06/26/2012, 06/21/2013, 07/14/2014, 06/14/2015, 07/17/2016, 05/31/2017, 05/31/2018  . PFIZER(Purple Top)SARS-COV-2 Vaccination 11/07/2019, 11/25/2019, 09/08/2020  . Pneumococcal Conjugate-13 08/09/2017  . Pneumococcal Polysaccharide-23 11/23/2020  . Pneumococcal-Unspecified 03/22/2006  . Tdap 06/20/2009, 11/07/2019   Pertinent  Health Maintenance Due  Topic Date Due  . INFLUENZA VACCINE  Completed  . PNA vac Low Risk Adult  Completed   Fall Risk  12/25/2020 11/23/2020 10/05/2020 09/04/2020 07/24/2020  Falls in the past year? 0 1 1 0 0  Number falls in past yr: 0 1 1 0 1  Injury with Fall? 0 1 1 0 0  Risk for fall due to : - History of fall(s);Impaired balance/gait - - -  Follow up - - - - -   Functional Status Survey:    Vitals:   12/25/20 1413  BP: 98/60  Pulse: 60  Resp: 16  Temp: (!) 97.1 F (36.2 C)  SpO2: 98%  Weight: 117 lb 12.8 oz (53.4 kg)  Height: 5' 9" (1.753  m)   Body mass index is 17.4 kg/m. Physical Exam Vitals reviewed.  Constitutional:      General: He is not in acute distress.    Appearance: He is underweight. He is not ill-appearing.  HENT:     Head: Normocephalic.     Mouth/Throat:     Mouth: Mucous membranes are moist.     Pharynx: Oropharynx is clear. No oropharyngeal exudate or posterior oropharyngeal erythema.  Eyes:     General: No scleral icterus.       Right eye: No discharge.        Left eye: No discharge.     Conjunctiva/sclera: Conjunctivae normal.     Pupils: Pupils are equal, round, and reactive to light.  Cardiovascular:     Rate and Rhythm: Normal rate and regular rhythm.     Pulses: Normal pulses.     Heart  sounds: Normal heart sounds. No murmur heard. No friction rub. No gallop.   Pulmonary:     Effort: Pulmonary effort is normal. No respiratory distress.     Breath sounds: Normal breath sounds. No wheezing, rhonchi or rales.  Chest:     Chest wall: No tenderness.  Abdominal:     General: Abdomen is flat. Bowel sounds are normal. There is no distension.     Palpations: Abdomen is soft. There is no mass.     Tenderness: There is no abdominal tenderness. There is no right CVA tenderness, left CVA tenderness, guarding or rebound.  Musculoskeletal:        General: No swelling or tenderness. Normal range of motion.     Cervical back: Normal range of motion. No rigidity or tenderness.     Right lower leg: No edema.     Left lower leg: No edema.  Lymphadenopathy:     Cervical: No cervical adenopathy.  Skin:    General: Skin is warm and dry.     Coloration: Skin is not pale.     Findings: No bruising, erythema or rash.  Neurological:     Mental Status: He is alert. Mental status is at baseline.     Cranial Nerves: No cranial nerve deficit.     Sensory: No sensory deficit.     Motor: No weakness.  Psychiatric:        Mood and Affect: Mood normal.        Speech: Speech normal.        Behavior: Behavior normal.        Thought Content: Thought content normal.        Cognition and Memory: Cognition is impaired. Memory is impaired.     Labs reviewed: Recent Labs    03/16/20 0843 04/28/20 0920 11/20/20 0948  NA 142 140 143  K 4.6 4.3 4.8  CL 101 101 101  CO2 36* 32 36*  GLUCOSE 91 90 98  BUN _0 CREATININE 1.02 1.08 0.99  CALCIUM 9.5 9.3 9.8   Recent Labs    03/16/20 0843  AST 22  ALT 13  BILITOT 1.0  PROT 6.5   Recent Labs    03/16/20 0843 11/20/20 0948  WBC 10.3 5.7  NEUTROABS 6,623 2,924  HGB 12.7* 13.9  HCT 38.0* 41.1  MCV 92.2 92.6  PLT 227 221   Lab Results  Component Value Date   TSH 3.58 02/24/2019   No results found for: HGBA1C Lab Results   Component Value Date   CHOL 222 (H) 02/24/2019   HDL 118 02/24/2019  LDLCALC 86 02/24/2019   TRIG 88 02/24/2019   CHOLHDL 1.9 02/24/2019    Significant Diagnostic Results in last 30 days:  No results found.  Assessment/Plan  1. Weight loss, abnormal Has had progressive weight loss could be multifactorial due  to dementia and poor oral intake and also on Aricept which could be contributing to anorexia. - advised to drink Ensure protein supplement in between his meals instead of during meals to prevent feeling of fullness.  Wife will contact Neurologist Marylyn Ishihara for evaluation of need for Aricept due to weight loss and anorexia possible side effects. Will obtain lab to rule out any infectious or metabolic etiologies.   - CBC with Differential/Platelet - CMP with eGFR([facility])  2. Functional diarrhea No recent diarrhea.  Continue on imodium as needed. - continue with hydration.   Family/ staff Communication: Reviewed plan of care with patient and wife verbalized understanding wife will schedule appointment with Neurologist.   Labs/tests ordered:   - CBC with Differential/Platelet - CMP with eGFR([facility])  Next Appointment: As needed if symptoms worsen or fail to improve.   Sandrea Hughs, NP

## 2020-12-25 NOTE — Telephone Encounter (Signed)
TC from patient's wife who is concerned that he has had diarrhea for a the last week or so and has lost down to 114 lbs from 116.4 lbs. He doesn't have a good appetite. Advised to contact his PCP and make an appt to be seen. Encouraged to push fluids of gatorade or salty soups like chicken noodle soup or others every 1-2 hours. Advised to avoid boost rigt right now with diarrhea issues. She verbalized understanding.

## 2020-12-26 LAB — COMPLETE METABOLIC PANEL WITH GFR
AG Ratio: 1.5 (calc) (ref 1.0–2.5)
ALT: 4 U/L — ABNORMAL LOW (ref 9–46)
AST: 19 U/L (ref 10–35)
Albumin: 4 g/dL (ref 3.6–5.1)
Alkaline phosphatase (APISO): 78 U/L (ref 35–144)
BUN/Creatinine Ratio: 16 (calc) (ref 6–22)
BUN: 20 mg/dL (ref 7–25)
CO2: 33 mmol/L — ABNORMAL HIGH (ref 20–32)
Calcium: 9.8 mg/dL (ref 8.6–10.3)
Chloride: 100 mmol/L (ref 98–110)
Creat: 1.25 mg/dL — ABNORMAL HIGH (ref 0.70–1.18)
GFR, Est African American: 63 mL/min/{1.73_m2} (ref >=60)
GFR, Est Non African American: 54 mL/min/{1.73_m2} — ABNORMAL LOW (ref >=60)
Globulin: 2.7 g/dL (calc) (ref 1.9–3.7)
Glucose, Bld: 99 mg/dL (ref 65–139)
Potassium: 4.9 mmol/L (ref 3.5–5.3)
Sodium: 139 mmol/L (ref 135–146)
Total Bilirubin: 0.9 mg/dL (ref 0.2–1.2)
Total Protein: 6.7 g/dL (ref 6.1–8.1)

## 2020-12-26 LAB — CBC WITH DIFFERENTIAL/PLATELET
Absolute Monocytes: 619 cells/uL (ref 200–950)
Basophils Absolute: 88 cells/uL (ref 0–200)
Basophils Relative: 1.3 %
Eosinophils Absolute: 197 cells/uL (ref 15–500)
Eosinophils Relative: 2.9 %
HCT: 39.7 % (ref 38.5–50.0)
Hemoglobin: 13.1 g/dL — ABNORMAL LOW (ref 13.2–17.1)
Lymphs Abs: 1707 cells/uL (ref 850–3900)
MCH: 31 pg (ref 27.0–33.0)
MCHC: 33 g/dL (ref 32.0–36.0)
MCV: 94.1 fL (ref 80.0–100.0)
MPV: 11.2 fL (ref 7.5–12.5)
Monocytes Relative: 9.1 %
Neutro Abs: 4189 cells/uL (ref 1500–7800)
Neutrophils Relative %: 61.6 %
Platelets: 220 10*3/uL (ref 140–400)
RBC: 4.22 10*6/uL (ref 4.20–5.80)
RDW: 13 % (ref 11.0–15.0)
Total Lymphocyte: 25.1 %
WBC: 6.8 10*3/uL (ref 3.8–10.8)

## 2020-12-26 NOTE — Telephone Encounter (Signed)
Called to set up AWV, wife does not want to set up this early.

## 2021-01-05 ENCOUNTER — Other Ambulatory Visit: Payer: Self-pay | Admitting: Internal Medicine

## 2021-01-09 ENCOUNTER — Telehealth: Payer: Self-pay

## 2021-01-09 NOTE — Telephone Encounter (Signed)
Generally when somebody is on a blood pressure pill it should be taken daily rather than as needed.  If you are willing to check blood pressure daily and check it not only in the mornings and the evenings because there is a difference then you can do it on an as-needed basis.  My preference would be to take it every day and check it randomly.

## 2021-01-09 NOTE — Telephone Encounter (Signed)
How is your blood pressure ? Recommend continue medication until seen by Dr.Miller

## 2021-01-09 NOTE — Telephone Encounter (Signed)
Patient's wife called back stating the patient has seen Dinah in the past but will be seeing Dr. Sabra Heck in the future. Wife wants to know does Dr. Sabra Heck want the patient to continue to amlodipine. To Dr. Sabra Heck and Webb Silversmith

## 2021-01-09 NOTE — Telephone Encounter (Signed)
Called wife. She states the patient recently seen neurologist Dr. Rondel Oh.They are keeping a check BP. If below 90/60 they will  not give amlodipine. He will continue 2.5 mg

## 2021-02-06 ENCOUNTER — Ambulatory Visit (INDEPENDENT_AMBULATORY_CARE_PROVIDER_SITE_OTHER): Payer: Medicare Other

## 2021-02-06 DIAGNOSIS — I495 Sick sinus syndrome: Secondary | ICD-10-CM | POA: Diagnosis not present

## 2021-02-06 LAB — CUP PACEART REMOTE DEVICE CHECK
Battery Remaining Longevity: 124 mo
Battery Remaining Percentage: 95.5 %
Battery Voltage: 2.99 V
Brady Statistic AP VP Percent: 1 %
Brady Statistic AP VS Percent: 94 %
Brady Statistic AS VP Percent: 1 %
Brady Statistic AS VS Percent: 5.5 %
Brady Statistic RA Percent Paced: 94 %
Brady Statistic RV Percent Paced: 1 %
Date Time Interrogation Session: 20220510034606
Implantable Lead Implant Date: 20191106
Implantable Lead Implant Date: 20191111
Implantable Lead Location: 753859
Implantable Lead Location: 753860
Implantable Lead Model: 1948
Implantable Pulse Generator Implant Date: 20191106
Lead Channel Impedance Value: 400 Ohm
Lead Channel Impedance Value: 610 Ohm
Lead Channel Pacing Threshold Amplitude: 0.5 V
Lead Channel Pacing Threshold Amplitude: 0.875 V
Lead Channel Pacing Threshold Pulse Width: 0.5 ms
Lead Channel Pacing Threshold Pulse Width: 0.5 ms
Lead Channel Sensing Intrinsic Amplitude: 2.6 mV
Lead Channel Sensing Intrinsic Amplitude: 2.8 mV
Lead Channel Setting Pacing Amplitude: 1.125
Lead Channel Setting Pacing Amplitude: 1.5 V
Lead Channel Setting Pacing Pulse Width: 0.5 ms
Lead Channel Setting Sensing Sensitivity: 0.5 mV
Pulse Gen Model: 2272
Pulse Gen Serial Number: 9083517

## 2021-02-20 DIAGNOSIS — F0281 Dementia in other diseases classified elsewhere with behavioral disturbance: Secondary | ICD-10-CM | POA: Diagnosis not present

## 2021-02-20 DIAGNOSIS — G3183 Dementia with Lewy bodies: Secondary | ICD-10-CM | POA: Diagnosis not present

## 2021-02-20 DIAGNOSIS — Z7189 Other specified counseling: Secondary | ICD-10-CM | POA: Diagnosis not present

## 2021-03-01 NOTE — Progress Notes (Signed)
Remote pacemaker transmission.   

## 2021-03-27 ENCOUNTER — Ambulatory Visit (INDEPENDENT_AMBULATORY_CARE_PROVIDER_SITE_OTHER): Payer: Medicare Other | Admitting: Family Medicine

## 2021-03-27 ENCOUNTER — Other Ambulatory Visit: Payer: Self-pay

## 2021-03-27 ENCOUNTER — Encounter: Payer: Self-pay | Admitting: Family Medicine

## 2021-03-27 VITALS — BP 120/70 | HR 60 | Temp 97.7°F | Ht 69.0 in | Wt 120.4 lb

## 2021-03-27 DIAGNOSIS — F0280 Dementia in other diseases classified elsewhere without behavioral disturbance: Secondary | ICD-10-CM

## 2021-03-27 DIAGNOSIS — R2681 Unsteadiness on feet: Secondary | ICD-10-CM | POA: Diagnosis not present

## 2021-03-27 DIAGNOSIS — I10 Essential (primary) hypertension: Secondary | ICD-10-CM | POA: Diagnosis not present

## 2021-03-27 DIAGNOSIS — G3183 Dementia with Lewy bodies: Secondary | ICD-10-CM | POA: Diagnosis not present

## 2021-03-27 NOTE — Progress Notes (Signed)
Provider:  Alain Honey, MD  Careteam: Patient Care Team: Wardell Honour, MD as PCP - General (Family Medicine) Croitoru, Dani Gobble, MD as PCP - Cardiology (Cardiology)  PLACE OF SERVICE:  [place] Directive information    Allergies  Allergen Reactions   Propofol Anaphylaxis    Chief Complaint  Patient presents with   Medical Management of Chronic Issues    Patient presents today for 4 month follow-up for blood pressure readings low and weight loss.     HPI: Patient is a 80 y.o. male patient is followed at [facility] for Lewy body dementia.  He is on Aricept as well as Lexapro and Sinemet IR for Parkinson's sodium features.  Was recently started on mirtazapine and weight loss seems to have improved. Wife is concerned about blood pressure.  He is on low-dose amlodipine.  She monitors his blood pressure and gives him the medication if pressure is above 90/60.  She questions whether or not he actually needs this low-dose medicine.  Review of Systems:  Review of Systems  Respiratory: Negative.    Cardiovascular: Negative.   Gastrointestinal: Negative.   Psychiatric/Behavioral:  Positive for memory loss. The patient is nervous/anxious.   All other systems reviewed and are negative.  Past Medical History:  Diagnosis Date   AC joint separation    Right shoulder   Acute pericarditis 08/09/2018   Admitted 11/19 for pericarditis; ? 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   SSS (sick sinus syndrome) ([place])    Past Surgical History:  Procedure Laterality Date   acromioclavicular separation  07/04/2016   CHEST TUBE  INSERTION Left 08/05/2018   EYE SURGERY     HERNIA REPAIR     LEAD REVISION/REPAIR N/A 08/10/2018   Procedure: LEAD REVISION/REPAIR;  Surgeon: Deboraha Sprang, MD;  Location: [facility];  Service: Cardiovascular;  Laterality: N/A;   NM MYOVIEW LTD     PACEMAKER IMPLANT N/A 08/05/2018   Procedure: St Jude dual lead PACEMAKER IMPLANT;  Surgeon: Sanda Klein, MD;  Location: [facility];  Service: Cardiovascular;  Laterality: N/A;   Right ingunial hernia repair     US ECHOCARDIOGRAPHY     Social History:   reports that he has never smoked. He has never used smokeless tobacco. He reports that he does not drink alcohol and does not use drugs.  Family History  Problem Relation Age of Onset   Hypertension Mother    Lung cancer Mother    Diabetes Mother    Thyroid disease Mother    Angina Father    Heart attack Father    Heart disease Brother    Diabetes Brother    Heart attack Brother    Asthma Maternal Grandmother    Stroke Maternal Grandfather    Diabetes Paternal Grandfather    Heart Problems Brother    Dementia Neg Hx     Medications: Patient's Medications  New Prescriptions   No medications on file  Previous Medications   ACETAMINOPHEN (TYLENOL) 325 MG TABLET    Take 650 mg by  mouth every 6 (six) hours as needed for mild pain.   AMLODIPINE (NORVASC) 2.5 MG TABLET    Take 1 tablet (2.5 mg total) by mouth daily.   ASPIRIN EC 81 MG TABLET    Take 81 mg by mouth daily.   CARBIDOPA-LEVODOPA (SINEMET IR) 25-100 MG TABLET    Take 1 tablet by mouth 3 (three) times daily. (0830, 1330, & 2030)   DONEPEZIL (ARICEPT) 10 MG TABLET    Take 10 mg by mouth daily.    ESCITALOPRAM (LEXAPRO) 10 MG TABLET    Take 1 tablet (10 mg total) by mouth at bedtime.   L-METHYLFOLATE-B12-B6-B2 (CEREFOLIN) 02-28-49-5 MG TABS    TAKE 1 TABLET BY MOUTH DAILY   MELATONIN-PYRIDOXINE (MELATIN PO)    Take 3 mg by mouth at bedtime.   MIRTAZAPINE (REMERON) 15 MG TABLET    Take 7.5 mg by mouth at  bedtime.   MOMETASONE (ELOCON) 0.1 % CREAM    Apply 1 application topically daily. To dry red scaly areas on face   MULTIPLE VITAMIN (MULTIVITAMIN WITH MINERALS) TABS TABLET    Take 1 tablet by mouth daily.  Modified Medications   No medications on file  Discontinued Medications   No medications on file    Physical Exam:  Vitals:   03/27/21 1431  BP: 120/70  Pulse: 60  Temp: 97.7 F (36.5 C)  TempSrc: Temporal  SpO2: 98%  Weight: 120 lb 6.4 oz (54.6 kg)  Height: 5\' 9"  (1.753 m)   Body mass index is 17.78 kg/m. Wt Readings from Last 3 Encounters:  03/27/21 120 lb 6.4 oz (54.6 kg)  12/25/20 117 lb 12.8 oz (53.4 kg)  12/05/20 116 lb 6.4 oz (52.8 kg)    Physical Exam Vitals and nursing note reviewed.  Constitutional:      Appearance: Normal appearance.  HENT:     Head: Normocephalic.  Cardiovascular:     Rate and Rhythm: Normal rate and regular rhythm.  Pulmonary:     Effort: Pulmonary effort is normal.     Breath sounds: Normal breath sounds.  Neurological:     General: No focal deficit present.     Mental Status: He is alert and oriented to person, place, and time.     Comments: Patient functions fairly well given his diagnosis.  He was able to tell me about his medicines, what time of the day he takes them. I observed him walking.  No difficulties.  There is no tremor or other obvious features of Parkinson syndrome    Labs reviewed: Basic Metabolic Panel: Recent Labs    04/28/20 0920 11/20/20 0948 12/25/20 1501  NA 140 143 139  K 4.3 4.8 4.9  CL 101 101 100  CO2 32 36* 33*  GLUCOSE 90 98 99  BUN 18 17 20   CREATININE 1.08 0.99 1.25*  CALCIUM 9.3 9.8 9.8   Liver Function Tests: Recent Labs    12/25/20 1501  AST 19  ALT 4*  BILITOT 0.9  PROT 6.7   No results for input(s): LIPASE, AMYLASE in the last 8760 hours. No results for input(s): AMMONIA in the last 8760 hours. CBC: Recent Labs    11/20/20 0948 12/25/20 1501  WBC 5.7 6.8  NEUTROABS  2,924 4,189  HGB 13.9 13.1*  HCT 41.1 39.7  MCV 92.6 94.1  PLT 221 220   Lipid Panel: No results for input(s): CHOL, HDL, LDLCALC, TRIG, CHOLHDL, LDLDIRECT in the last 8760 hours. TSH: No results for input(s): TSH in the  last 8760 hours. A1C: No results found for: HGBA1C   Assessment/Plan   1. Lewy body dementia without behavioral disturbance ([place]) This is stable per his wife.  Continue on low-dose donepezil 5 mg as well as Lexapro and Remeron for appetite stimulation  2. Unsteady gait when walking Observed his gait.  No major issues noted  3. Hypertension, unspecified type Will hold blood pressure medicine.  Have given her parameters of 90/60 and 160/90.  As long as blood pressure remains between these highs and lows with hold amlodipine.  If pressure does creep up may consider adding back thiazide.  Alain Honey, MD [facility] [phone]

## 2021-03-27 NOTE — Patient Instructions (Signed)
Continue to monitor BP BP should be between 90/60 on bottom and 160/90 on top

## 2021-05-08 ENCOUNTER — Ambulatory Visit (INDEPENDENT_AMBULATORY_CARE_PROVIDER_SITE_OTHER): Payer: Medicare Other

## 2021-05-08 DIAGNOSIS — I495 Sick sinus syndrome: Secondary | ICD-10-CM | POA: Diagnosis not present

## 2021-05-08 LAB — CUP PACEART REMOTE DEVICE CHECK
Battery Remaining Longevity: 95 mo
Battery Remaining Percentage: 78 %
Battery Voltage: 3.01 V
Brady Statistic AP VP Percent: 1 %
Brady Statistic AP VS Percent: 95 %
Brady Statistic AS VP Percent: 1 %
Brady Statistic AS VS Percent: 4.8 %
Brady Statistic RA Percent Paced: 95 %
Brady Statistic RV Percent Paced: 1 %
Date Time Interrogation Session: 20220809020015
Implantable Lead Implant Date: 20191106
Implantable Lead Implant Date: 20191111
Implantable Lead Location: 753859
Implantable Lead Location: 753860
Implantable Lead Model: 1948
Implantable Pulse Generator Implant Date: 20191106
Lead Channel Impedance Value: 390 Ohm
Lead Channel Impedance Value: 590 Ohm
Lead Channel Pacing Threshold Amplitude: 0.5 V
Lead Channel Pacing Threshold Amplitude: 0.75 V
Lead Channel Pacing Threshold Pulse Width: 0.5 ms
Lead Channel Pacing Threshold Pulse Width: 0.5 ms
Lead Channel Sensing Intrinsic Amplitude: 1.1 mV
Lead Channel Sensing Intrinsic Amplitude: 3.1 mV
Lead Channel Setting Pacing Amplitude: 1 V
Lead Channel Setting Pacing Amplitude: 1.5 V
Lead Channel Setting Pacing Pulse Width: 0.5 ms
Lead Channel Setting Sensing Sensitivity: 0.5 mV
Pulse Gen Model: 2272
Pulse Gen Serial Number: 9083517

## 2021-05-31 NOTE — Progress Notes (Signed)
Remote pacemaker transmission.   

## 2021-06-14 ENCOUNTER — Other Ambulatory Visit: Payer: Self-pay

## 2021-06-14 ENCOUNTER — Encounter: Payer: Medicare Other | Attending: Family Medicine | Admitting: Nutrition

## 2021-06-14 ENCOUNTER — Encounter: Payer: Self-pay | Admitting: Nutrition

## 2021-06-14 VITALS — Ht 69.0 in | Wt 126.6 lb

## 2021-06-14 DIAGNOSIS — F0280 Dementia in other diseases classified elsewhere without behavioral disturbance: Secondary | ICD-10-CM

## 2021-06-14 DIAGNOSIS — Z681 Body mass index (BMI) 19 or less, adult: Secondary | ICD-10-CM

## 2021-06-14 DIAGNOSIS — R636 Underweight: Secondary | ICD-10-CM

## 2021-06-14 DIAGNOSIS — G3183 Dementia with Lewy bodies: Secondary | ICD-10-CM | POA: Insufficient documentation

## 2021-06-14 DIAGNOSIS — R634 Abnormal weight loss: Secondary | ICD-10-CM

## 2021-06-14 NOTE — Progress Notes (Signed)
  Medical Nutrition Therapy:  Appt start time: 1430 end time:  1500   Assessment:  Primary concerns today: Weigh loss, underweight, Lewy Body Dementia. Here with his wife.  Gained 10 lbs. Has been eating more.  Feels better. Has more color in his face and skin. Bowls and urine ok.  No dizzy, weak or unstable. His wife notes the high calorie puddings and recipes have helped.  Wt Readings from Last 3 Encounters:  03/27/21 120 lb 6.4 oz (54.6 kg)  12/25/20 117 lb 12.8 oz (53.4 kg)  12/05/20 116 lb 6.4 oz (52.8 kg)   Ht Readings from Last 3 Encounters:  03/27/21 '5\' 9"'$  (1.753 m)  12/25/20 '5\' 9"'$  (1.753 m)  12/05/20 '5\' 9"'$  (1.753 m)    Preferred Learning Style:  No preference indicated   Learning Readiness:  Change in progress  MEDICATIONS:    DIETARY INTAKE:   24-hr recall:  B ( AM): boiled egg, muffiin, 1/2 donut, coffee Snk ( AM):  L ( PM): BBQ sandwich, baked beans, fries, coke Snk ( PM): fig bars or cookies D ( PM): Banana sandwich, boost, pudding high pudding. Snk ( PM): Boost occasionally, Cookies   Beverages: water, pepsi  Usual physical activity:  ADL   Estimated energy needs: 2000  calories 225 g carbohydrates 150 g protein 56 g fat  Progress Towards Goal(s):  In progress.   Nutritional Diagnosis:  NB-1.1 Food and nutrition-related knowledge deficit As related to Underweight and weight loss.  As evidenced by BMI 16.    Intervention:  High Calorie High Protein diet. Healthy snacks. Keep up the great job!! Eat 5-6 small meals per day or 3 main meals and 2-3 snacks Drink the Boost or equivalent when you don't feel like eating a full meal Continue to increase protein rich foods in diet. Try to gain 2 lbs per month.  Teaching Method Utilized:  Visual Auditory Hands on  Handouts given during visit include: High Calorie High Protein Diet Ways to increase calories   Barriers to learning/adherence to lifestyle change: none  Demonstrated degree of  understanding via:  Teach Back   Monitoring/Evaluation:  Dietary intake, exercise,  and body weight in 6 months.).

## 2021-06-29 ENCOUNTER — Encounter: Payer: Self-pay | Admitting: Nutrition

## 2021-06-29 NOTE — Patient Instructions (Signed)
Keep up the great job!! Eat 5-6 small meals per day or 3 main meals and 2-3 snacks Drink the Boost or equivalent when you don't feel like eating a full meal Continue to increase protein rich foods in diet. Try to gain 2 lbs per month.

## 2021-07-06 ENCOUNTER — Other Ambulatory Visit: Payer: Self-pay | Admitting: *Deleted

## 2021-07-06 DIAGNOSIS — F324 Major depressive disorder, single episode, in partial remission: Secondary | ICD-10-CM

## 2021-07-06 MED ORDER — ESCITALOPRAM OXALATE 10 MG PO TABS: 10.0000 mg | ORAL_TABLET | Freq: Every day | ORAL | 3 refills | Status: DC

## 2021-07-06 NOTE — Telephone Encounter (Signed)
Pharmacy requested refill.  Pended Rx due to HIGH ALERT Warning and sent to [facility] for approval due to Dr. Sabra Heck out of office.

## 2021-07-17 ENCOUNTER — Encounter: Payer: Self-pay | Admitting: Family Medicine

## 2021-07-17 ENCOUNTER — Other Ambulatory Visit: Payer: Self-pay

## 2021-07-17 ENCOUNTER — Ambulatory Visit (INDEPENDENT_AMBULATORY_CARE_PROVIDER_SITE_OTHER): Payer: Medicare Other | Admitting: Family Medicine

## 2021-07-17 VITALS — BP 118/76 | HR 70 | Temp 97.7°F | Ht 69.0 in | Wt 126.0 lb

## 2021-07-17 DIAGNOSIS — I10 Essential (primary) hypertension: Secondary | ICD-10-CM | POA: Diagnosis not present

## 2021-07-17 DIAGNOSIS — I5032 Chronic diastolic (congestive) heart failure: Secondary | ICD-10-CM

## 2021-07-17 DIAGNOSIS — F0280 Dementia in other diseases classified elsewhere without behavioral disturbance: Secondary | ICD-10-CM | POA: Diagnosis not present

## 2021-07-17 DIAGNOSIS — Z23 Encounter for immunization: Secondary | ICD-10-CM

## 2021-07-17 DIAGNOSIS — G3183 Dementia with Lewy bodies: Secondary | ICD-10-CM | POA: Diagnosis not present

## 2021-07-17 DIAGNOSIS — L219 Seborrheic dermatitis, unspecified: Secondary | ICD-10-CM | POA: Diagnosis not present

## 2021-07-17 MED ORDER — MOMETASONE FUROATE 0.1 % EX CREA: 1.0000 "application " | TOPICAL_CREAM | Freq: Every day | CUTANEOUS | 3 refills | Status: DC

## 2021-07-17 NOTE — Progress Notes (Signed)
Provider:  Alain Honey, MD  Careteam: Patient Care Team: Wardell Honour, MD as PCP - General (Family Medicine) Croitoru, Dani Gobble, MD as PCP - Cardiology (Cardiology)  PLACE OF SERVICE:  [place] Directive information    Allergies  Allergen Reactions   Propofol Anaphylaxis    Chief Complaint  Patient presents with   Medical Management of Chronic Issues    Patient presents today for a 4 month follow-up.   Quality Metric Gaps    Zoster,FLU, COVID booster     HPI: Patient is a 80 y.o. male this is routine visit for medical management of chronic problems including Parkinson is on Lewy body dementia depression.  He is really doing pretty well..  Weight is stable on mirtazapine but he still needs to be reminded to eat by his wife.  She also reminds him to take his medication. He denies any tremor, swallowing difficulties, depression or constipation.  He is taking donepezil for dementia.  Also takes Lexapro  Review of Systems:  Review of Systems  Constitutional: Negative.   HENT: Negative.    Respiratory: Negative.    Cardiovascular: Negative.   Genitourinary: Negative.   Musculoskeletal: Negative.   Neurological: Negative.   Psychiatric/Behavioral: Negative.    All other systems reviewed and are negative.  Past Medical History:  Diagnosis Date   AC joint separation    Right shoulder   Acute pericarditis 08/09/2018   Admitted 11/19 for pericarditis; ? 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   SSS (sick sinus syndrome) ([place])    Past Surgical History:  Procedure Laterality Date   acromioclavicular separation  07/04/2016    CHEST TUBE INSERTION Left 08/05/2018   EYE SURGERY     HERNIA REPAIR     LEAD REVISION/REPAIR N/A 08/10/2018   Procedure: LEAD REVISION/REPAIR;  Surgeon: Deboraha Sprang, MD;  Location: [facility];  Service: Cardiovascular;  Laterality: N/A;   NM MYOVIEW LTD     PACEMAKER IMPLANT N/A 08/05/2018   Procedure: St Jude dual lead PACEMAKER IMPLANT;  Surgeon: Sanda Klein, MD;  Location: [facility];  Service: Cardiovascular;  Laterality: N/A;   Right ingunial hernia repair     US ECHOCARDIOGRAPHY     Social History:   reports that he has never smoked. He has never used smokeless tobacco. He reports that he does not drink alcohol and does not use drugs.  Family History  Problem Relation Age of Onset   Hypertension Mother    Lung cancer Mother    Diabetes Mother    Thyroid disease Mother    Angina Father    Heart attack Father    Heart disease Brother    Diabetes Brother    Heart attack Brother    Asthma Maternal Grandmother    Stroke Maternal Grandfather    Diabetes Paternal Grandfather    Heart Problems Brother    Dementia Neg Hx     Medications: Patient's Medications  New Prescriptions   No medications on file  Previous Medications   ACETAMINOPHEN (TYLENOL) 325 MG TABLET  Take 650 mg by mouth every 6 (six) hours as needed for mild pain.   ASPIRIN EC 81 MG TABLET    Take 81 mg by mouth daily.   CARBIDOPA-LEVODOPA (SINEMET IR) 25-100 MG TABLET    Take 1 tablet by mouth 3 (three) times daily. (0830, 1330, & 2030)   DONEPEZIL (ARICEPT) 10 MG TABLET    Take 5 mg by mouth daily.   ESCITALOPRAM (LEXAPRO) 10 MG TABLET    Take 1 tablet (10 mg total) by mouth at bedtime.   L-METHYLFOLATE-B12-B6-B2 (CEREFOLIN) 02-28-49-5 MG TABS    TAKE 1 TABLET BY MOUTH DAILY   MELATONIN-PYRIDOXINE (MELATIN PO)    Take 3 mg by mouth at bedtime.   MIRTAZAPINE (REMERON) 15 MG TABLET    Take 7.5 mg by mouth at bedtime.   MOMETASONE (ELOCON) 0.1 % CREAM    Apply 1 application topically  daily. To dry red scaly areas on face   MULTIPLE VITAMIN (MULTIVITAMIN WITH MINERALS) TABS TABLET    Take 1 tablet by mouth daily.  Modified Medications   No medications on file  Discontinued Medications   AMLODIPINE (NORVASC) 2.5 MG TABLET    Take 1 tablet (2.5 mg total) by mouth daily.    Physical Exam:  Vitals:   07/17/21 1055  BP: 118/76  Pulse: 70  Temp: 97.7 F (36.5 C)  SpO2: 96%  Weight: 126 lb (57.2 kg)  Height: 5\' 9"  (1.753 m)   Body mass index is 18.61 kg/m. Wt Readings from Last 3 Encounters:  07/17/21 126 lb (57.2 kg)  06/14/21 126 lb 9.6 oz (57.4 kg)  03/27/21 120 lb 6.4 oz (54.6 kg)    Physical Exam Vitals and nursing note reviewed.  Constitutional:      Appearance: Normal appearance.  Cardiovascular:     Rate and Rhythm: Normal rate and regular rhythm.     Heart sounds: Murmur heard.  Pulmonary:     Effort: Pulmonary effort is normal.     Breath sounds: Normal breath sounds.  Abdominal:     General: Abdomen is flat.     Palpations: Abdomen is soft.  Neurological:     General: No focal deficit present.     Mental Status: He is alert and oriented to person, place, and time.     Comments: Patient answers questions appropriately.  I see no decline in his memory from 4 months ago  Psychiatric:        Mood and Affect: Mood normal.        Behavior: Behavior normal.    Labs reviewed: Basic Metabolic Panel: Recent Labs    11/20/20 0948 12/25/20 1501  NA 143 139  K 4.8 4.9  CL 101 100  CO2 36* 33*  GLUCOSE 98 99  BUN 17 20  CREATININE 0.99 1.25*  CALCIUM 9.8 9.8   Liver Function Tests: Recent Labs    12/25/20 1501  AST 19  ALT 4*  BILITOT 0.9  PROT 6.7   No results for input(s): LIPASE, AMYLASE in the last 8760 hours. No results for input(s): AMMONIA in the last 8760 hours. CBC: Recent Labs    11/20/20 0948 12/25/20 1501  WBC 5.7 6.8  NEUTROABS 2,924 4,189  HGB 13.9 13.1*  HCT 41.1 39.7  MCV 92.6 94.1  PLT 221 220   Lipid  Panel: No results for input(s): CHOL, HDL, LDLCALC, TRIG, CHOLHDL, LDLDIRECT in the last 8760 hours. TSH: No results for input(s): TSH in the last 8760 hours. A1C: No results  found for: HGBA1C   Assessment/Plan  1. Seborrheic dermatitis Revealed Elocon, which is effective  2. Need for influenza vaccination  - Flu Vaccine QUAD High Dose(Fluad)  3. Chronic diastolic CHF (congestive heart failure) (HCC) Asymptomatic  4. Hypertension, unspecified type Patient is off all blood pressure medicine now and blood pressures are doing well.  Today it is 118/76  5. Lewy body dementia without behavioral disturbance ([place]) Followed at [facility] continues on low-dose Aricept as well as Lexapro and mirtazapine for appetite   Alain Honey, MD [place] [phone]

## 2021-08-03 ENCOUNTER — Other Ambulatory Visit: Payer: Self-pay | Admitting: Family Medicine

## 2021-08-07 ENCOUNTER — Ambulatory Visit (INDEPENDENT_AMBULATORY_CARE_PROVIDER_SITE_OTHER): Payer: Medicare Other

## 2021-08-07 DIAGNOSIS — M79676 Pain in unspecified toe(s): Secondary | ICD-10-CM | POA: Diagnosis not present

## 2021-08-07 DIAGNOSIS — B351 Tinea unguium: Secondary | ICD-10-CM | POA: Diagnosis not present

## 2021-08-07 DIAGNOSIS — I495 Sick sinus syndrome: Secondary | ICD-10-CM

## 2021-08-07 LAB — CUP PACEART REMOTE DEVICE CHECK
Battery Remaining Longevity: 94 mo
Battery Remaining Percentage: 76 %
Battery Voltage: 3.01 V
Brady Statistic AP VP Percent: 1 %
Brady Statistic AP VS Percent: 95 %
Brady Statistic AS VP Percent: 1 %
Brady Statistic AS VS Percent: 5.3 %
Brady Statistic RA Percent Paced: 94 %
Brady Statistic RV Percent Paced: 1 %
Date Time Interrogation Session: 20221108020014
Implantable Lead Implant Date: 20191106
Implantable Lead Implant Date: 20191111
Implantable Lead Location: 753859
Implantable Lead Location: 753860
Implantable Lead Model: 1948
Implantable Pulse Generator Implant Date: 20191106
Lead Channel Impedance Value: 410 Ohm
Lead Channel Impedance Value: 640 Ohm
Lead Channel Pacing Threshold Amplitude: 0.5 V
Lead Channel Pacing Threshold Amplitude: 0.75 V
Lead Channel Pacing Threshold Pulse Width: 0.5 ms
Lead Channel Pacing Threshold Pulse Width: 0.5 ms
Lead Channel Sensing Intrinsic Amplitude: 1.5 mV
Lead Channel Sensing Intrinsic Amplitude: 3.8 mV
Lead Channel Setting Pacing Amplitude: 1 V
Lead Channel Setting Pacing Amplitude: 1.5 V
Lead Channel Setting Pacing Pulse Width: 0.5 ms
Lead Channel Setting Sensing Sensitivity: 0.5 mV
Pulse Gen Model: 2272
Pulse Gen Serial Number: 9083517

## 2021-08-15 NOTE — Progress Notes (Signed)
Remote pacemaker transmission.   

## 2021-08-28 DIAGNOSIS — G20 Parkinson's disease: Secondary | ICD-10-CM | POA: Diagnosis not present

## 2021-08-31 DIAGNOSIS — Z23 Encounter for immunization: Secondary | ICD-10-CM | POA: Diagnosis not present

## 2021-11-05 ENCOUNTER — Ambulatory Visit (INDEPENDENT_AMBULATORY_CARE_PROVIDER_SITE_OTHER): Payer: Medicare Other | Admitting: Cardiovascular Disease

## 2021-11-05 ENCOUNTER — Other Ambulatory Visit: Payer: Self-pay

## 2021-11-05 VITALS — BP 118/72 | HR 60 | Ht 68.0 in | Wt 124.6 lb

## 2021-11-05 DIAGNOSIS — I10 Essential (primary) hypertension: Secondary | ICD-10-CM | POA: Diagnosis not present

## 2021-11-05 DIAGNOSIS — Z950 Presence of cardiac pacemaker: Secondary | ICD-10-CM

## 2021-11-05 DIAGNOSIS — I495 Sick sinus syndrome: Secondary | ICD-10-CM

## 2021-11-05 NOTE — Progress Notes (Signed)
Cardiology Office Note:    Date:  11/06/2021   ID:  Charles [name], DOB 04-03-1941, MRN [id number]  PCP:  Wardell Honour, MD  Cardiologist:  New Electrophysiologist:  None   Referring MD: Wardell Honour, MD   Chief Complaint  Patient presents with   Pacemaker Check     History of Present Illness:    Charles [name] is a 81 y.o. male with a hx of rapidly progressive dementia, hypertension, history of diastolic dysfunction and aortic insufficiency, Parkinson's and sinus node dysfunction with symptomatic bradycardia.  From a cardiac point of view he is doing well.  He has not had any falls or syncopal events since the pacemaker was implanted.  He had some issues with worsening symptoms of Parkinson's disease where he would not move or speak for protracted periods of time and his medications were recently adjusted, with improvement.  He has not had any problems with orthostatic hypotension.  All his blood pressure medications have been discontinued.  He denies orthopnea, PND, edema, claudication, bleeding problems or injuries.  As always, he gets excited when he has to come to a doctor's appointment and his blood pressure was elevated.  Recheck just 10 minutes later his blood pressure was completely normal.  He has gained back a little weight although he remains quite lean with a BMI under 19.  He has generally been doing well and has not had any further falls.  He denies problems with shortness of breath, chest pain, palpitations and has not experienced syncope or dizziness.  No leg edema.  His blood pressure is usually very well controlled at home, but was a little high today because he got excited with the change in schedule.  Mobility remains limited by Parkinson's disease and his activity is also limited by his dementia.  Had problems with orthostatic hypotension.    Pacemaker interrogation shows normal device function.  Estimated generator longevity is over 7 years.  He has 94 %  atrial pacing at 60 bpm and does not require ventricular pacing.  His device has recorded a handful of episodes of paroxysmal atrial tachycardia the longest being only 12 seconds in duration.  These are not symptomatic.  He has not had any episodes of ventricular tachycardia or atrial fibrillation.  The heart rate histogram is marginally improved after return rate response on, but he is quite sedentary.  Lead parameters remain good.    He underwent implantation of a dual-chamber pacemaker (St Jude Assurity) in November 0354, complicated by lead perforation with pericarditis and pneumothorax.  However, he has recovered well from that procedure.  He seems to be more active and more interactive according to his family ever since the pacemaker was implanted.     Past Medical History:  Diagnosis Date   AC joint separation    Right shoulder   Acute pericarditis 08/09/2018   Admitted 11/19 for pericarditis; ? 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   SSS (sick sinus syndrome) ([place])     Past Surgical History:  Procedure Laterality Date   acromioclavicular separation  07/04/2016  CHEST TUBE INSERTION Left 08/05/2018   EYE SURGERY     HERNIA REPAIR     LEAD REVISION/REPAIR N/A 08/10/2018   Procedure: LEAD REVISION/REPAIR;  Surgeon: Deboraha Sprang, MD;  Location: [place];  Service: Cardiovascular;  Laterality: N/A;   NM MYOVIEW LTD     PACEMAKER IMPLANT N/A 08/05/2018   Procedure: St Jude dual lead PACEMAKER IMPLANT;  Surgeon: Sanda Klein, MD;  Location: [facility];  Service: Cardiovascular;  Laterality: N/A;   Right ingunial hernia repair     US  ECHOCARDIOGRAPHY      Current Medications: Current Meds  Medication Sig   aspirin EC 81 MG tablet Take 81 mg by mouth daily.   carbidopa-levodopa (SINEMET IR) 25-100 MG tablet Take 1 tablet by mouth 3 (three) times daily. (0830, 1330, & 2030)   donepezil (ARICEPT) 10 MG tablet Take 5 mg by mouth daily.   escitalopram (LEXAPRO) 10 MG tablet Take 1 tablet (10 mg total) by mouth at bedtime.   L-Methylfolate-B12-B6-B2 (CEREFOLIN) 02-28-49-5 MG TABS TAKE 1 TABLET BY MOUTH DAILY   Melatonin-Pyridoxine (MELATIN PO) Take 3 mg by mouth at bedtime.   mirtazapine (REMERON) 15 MG tablet Take 7.5 mg by mouth at bedtime.   mometasone (ELOCON) 0.1 % cream Apply 1 application topically daily. To dry red scaly areas on face   Multiple Vitamin (MULTIVITAMIN WITH MINERALS) TABS tablet Take 1 tablet by mouth daily.     Allergies:   Propofol   Social History   Socioeconomic History   Marital status: Married    Spouse name: Not on file   Number of children: 3   Years of education: Not on file   Highest education level: Associate degree: academic program  Occupational History   Not on file  Tobacco Use   Smoking status: Never   Smokeless tobacco: Never  Vaping Use   Vaping Use: Never used  Substance and Sexual Activity   Alcohol use: No   Drug use: No   Sexual activity: Not on file  Other Topics Concern   Not on file  Social History Narrative   Lives at home with his wife   Right handed   No caffeine    Social Determinants of Health   Financial Resource Strain: Not on file  Food Insecurity: Not on file  Transportation Needs: Not on file  Physical Activity: Not on file  Stress: Not on file  Social Connections: Not on file     Family History: The patient's family history includes Angina in his father; Asthma in his maternal grandmother; Diabetes in his brother, mother, and paternal grandfather; Heart Problems in his brother; Heart attack in his brother and father; Heart disease in his  brother; Hypertension in his mother; Lung cancer in his mother; Stroke in his maternal grandfather; Thyroid disease in his mother. There is no history of Dementia.  ROS:   Please see the history of present illness.   All other systems are reviewed and are negative.   EKGs/Labs/Other Studies Reviewed:     EKG:  EKG is ordered today.  Personally reviewed shows atrial paced, ventricular sensed rhythm with minor nonspecific T wave changes.  Voltage criteria for LVH are present probably because he is extremely lean.  QTc 398 ms Recent Labs: 12/25/2020: ALT 4; BUN 20; Creat 1.25; Hemoglobin 13.1; Platelets 220; Potassium 4.9; Sodium 139  Recent Lipid Panel    Component Value Date/Time   CHOL 222 (H) 02/24/2019 0902   TRIG  88 02/24/2019 0902   HDL 118 02/24/2019 0902   CHOLHDL 1.9 02/24/2019 0902   LDLCALC 86 02/24/2019 0902    Physical Exam:    VS:  BP 118/72    Pulse 60    Ht 5\' 8"  (1.727 m)    Wt 124 lb 9.6 oz (56.5 kg)    SpO2 96%    BMI 18.95 kg/m     Wt Readings from Last 3 Encounters:  11/05/21 124 lb 9.6 oz (56.5 kg)  07/17/21 126 lb (57.2 kg)  06/14/21 126 lb 9.6 oz (57.4 kg)      General: Alert, oriented x3, no distress, healthy left subclavian pacemaker site Head: no evidence of trauma, PERRL, EOMI, no exophtalmos or lid lag, no myxedema, no xanthelasma; normal ears, nose and oropharynx Neck: normal jugular venous pulsations and no hepatojugular reflux; brisk carotid pulses without delay and no carotid bruits Chest: clear to auscultation, no signs of consolidation by percussion or palpation, normal fremitus, symmetrical and full respiratory excursions Cardiovascular: normal position and quality of the apical impulse, regular rhythm, normal first and second heart sounds, no murmurs, rubs or gallops Abdomen: no tenderness or distention, no masses by palpation, no abnormal pulsatility or arterial bruits, normal bowel sounds, no hepatosplenomegaly Extremities: no clubbing,  cyanosis or edema; 2+ radial, ulnar and brachial pulses bilaterally; 2+ right femoral, posterior tibial and dorsalis pedis pulses; 2+ left femoral, posterior tibial and dorsalis pedis pulses; no subclavian or femoral bruits Neurological: grossly nonfocal Psych: Normal mood and affect    ASSESSMENT:    1. SSS (sick sinus syndrome) ([place])   2. Pacemaker   3. Essential hypertension    PLAN:    In order of problems listed above:  Sinus bradycardia/SSS: Even with rate response turned on, his heart rate histograms remain very blunted due to sedentary lifestyle and Parkinson's disease. PPM: Normal device function.  Does not require ventricular pacing.  Continue remote downloads every 3 months. HTN: Normal blood pressure after he was allowed to rest for a few minutes.  No longer takes any blood pressure medications and this has helped with reducing the events of orthostatic hypotension. Severe protein calorie malnutrition: He has gained back some weight.  No longer in severe malnutrition range. Parkinson's disease/dementia: Improved symptoms with recent adjustment in his carbidopa levodopa schedule.   Medication Adjustments/Labs and Tests Ordered: Current medicines are reviewed at length with the patient today.  Concerns regarding medicines are outlined above.  Orders Placed This Encounter  Procedures   EKG 12-Lead   No orders of the defined types were placed in this encounter.   Patient Instructions  Medication Instructions:  No changes *If you need a refill on your cardiac medications before your next appointment, please call your pharmacy*   Lab Work: None ordered If you have labs (blood work) drawn today and your tests are completely normal, you will receive your results only by: [place] (if you have MyChart) OR A paper copy in the mail If you have any lab test that is abnormal or we need to change your treatment, we will call you to review the  results.   Testing/Procedures: None ordered   Follow-Up: At [facility], you and your health needs are our priority.  As part of our continuing mission to provide you with exceptional heart care, we have created designated Provider Care Teams.  These Care Teams include your primary Cardiologist (physician) and Advanced Practice Providers (APPs -  Physician Assistants and Nurse Practitioners) who all  work together to provide you with the care you need, when you need it.  We recommend signing up for the patient portal called "MyChart".  Sign up information is provided on this After Visit Summary.  MyChart is used to connect with patients for Virtual Visits (Telemedicine).  Patients are able to view lab/test results, encounter notes, upcoming appointments, etc.  Non-urgent messages can be sent to your provider as well.   To learn more about what you can do with MyChart, go to [email or web address].    Your next appointment:   12 month(s)  The format for your next appointment:   In Person  Provider:   Sanda Klein, MD {     Signed, Sanda Klein, MD  11/06/2021 5:32 PM    [place]

## 2021-11-05 NOTE — Patient Instructions (Signed)

## 2021-11-06 ENCOUNTER — Encounter: Payer: Self-pay | Admitting: Cardiovascular Disease

## 2021-11-06 ENCOUNTER — Ambulatory Visit (INDEPENDENT_AMBULATORY_CARE_PROVIDER_SITE_OTHER): Payer: Medicare Other

## 2021-11-06 DIAGNOSIS — I495 Sick sinus syndrome: Secondary | ICD-10-CM

## 2021-11-06 LAB — CUP PACEART REMOTE DEVICE CHECK
Battery Remaining Longevity: 91 mo
Battery Remaining Percentage: 74 %
Battery Voltage: 3.01 V
Brady Statistic AP VP Percent: 1 %
Brady Statistic AP VS Percent: 94 %
Brady Statistic AS VP Percent: 1 %
Brady Statistic AS VS Percent: 6 %
Brady Statistic RA Percent Paced: 94 %
Brady Statistic RV Percent Paced: 1 %
Date Time Interrogation Session: 20230207020014
Implantable Lead Implant Date: 20191106
Implantable Lead Implant Date: 20191111
Implantable Lead Location: 753859
Implantable Lead Location: 753860
Implantable Lead Model: 1948
Implantable Pulse Generator Implant Date: 20191106
Lead Channel Impedance Value: 440 Ohm
Lead Channel Impedance Value: 680 Ohm
Lead Channel Pacing Threshold Amplitude: 0.5 V
Lead Channel Pacing Threshold Amplitude: 0.875 V
Lead Channel Pacing Threshold Pulse Width: 0.5 ms
Lead Channel Pacing Threshold Pulse Width: 0.5 ms
Lead Channel Sensing Intrinsic Amplitude: 2.1 mV
Lead Channel Sensing Intrinsic Amplitude: 4.2 mV
Lead Channel Setting Pacing Amplitude: 1.125
Lead Channel Setting Pacing Amplitude: 1.5 V
Lead Channel Setting Pacing Pulse Width: 0.5 ms
Lead Channel Setting Sensing Sensitivity: 0.5 mV
Pulse Gen Model: 2272
Pulse Gen Serial Number: 9083517

## 2021-11-09 NOTE — Progress Notes (Signed)
Remote pacemaker transmission.   

## 2021-11-13 DIAGNOSIS — I70203 Unspecified atherosclerosis of native arteries of extremities, bilateral legs: Secondary | ICD-10-CM | POA: Diagnosis not present

## 2021-11-13 DIAGNOSIS — M79676 Pain in unspecified toe(s): Secondary | ICD-10-CM | POA: Diagnosis not present

## 2021-11-13 DIAGNOSIS — L84 Corns and callosities: Secondary | ICD-10-CM | POA: Diagnosis not present

## 2021-11-13 DIAGNOSIS — B351 Tinea unguium: Secondary | ICD-10-CM | POA: Diagnosis not present

## 2021-12-18 ENCOUNTER — Encounter: Payer: Self-pay | Admitting: Family Medicine

## 2021-12-18 ENCOUNTER — Other Ambulatory Visit: Payer: Self-pay

## 2021-12-18 ENCOUNTER — Ambulatory Visit (INDEPENDENT_AMBULATORY_CARE_PROVIDER_SITE_OTHER): Payer: Medicare Other | Admitting: Family Medicine

## 2021-12-18 VITALS — BP 132/86 | HR 63 | Temp 97.3°F | Ht 68.0 in | Wt 123.6 lb

## 2021-12-18 DIAGNOSIS — F324 Major depressive disorder, single episode, in partial remission: Secondary | ICD-10-CM | POA: Diagnosis not present

## 2021-12-18 DIAGNOSIS — I10 Essential (primary) hypertension: Secondary | ICD-10-CM | POA: Diagnosis not present

## 2021-12-18 DIAGNOSIS — I5032 Chronic diastolic (congestive) heart failure: Secondary | ICD-10-CM

## 2021-12-18 DIAGNOSIS — G20 Parkinson's disease: Secondary | ICD-10-CM | POA: Diagnosis not present

## 2021-12-18 DIAGNOSIS — G20A1 Parkinson's disease without dyskinesia, without mention of fluctuations: Secondary | ICD-10-CM | POA: Insufficient documentation

## 2021-12-18 NOTE — Progress Notes (Signed)
? ? ?Provider:  ?Alain Honey, MD ? ?Careteam: ?Patient Care Team: ?Wardell Honour, MD as PCP - General (Family Medicine) ?Croitoru, Dani Gobble, MD as PCP - Cardiology (Cardiology) ? ?PLACE OF SERVICE:  ?[facility]  ?Advanced Directive information ?  ? ?Allergies  ?Allergen Reactions  ? Propofol Anaphylaxis  ? ? ?Chief Complaint  ?Patient presents with  ? Medical Management of Chronic Issues  ?  Patient presents today for a 5 month follow-up.  ? Quality Metric Gaps  ?  Zoster and COVID booster  ? ? ? ?HPI: Patient is a 81 y.o. male routine visit for medical management of chronic problems including Parkinson's, major depressive disorder, chronic diastolic heart failure and sick sinus syndrome.  He has recently seen cardiologist who tested his pacemaker battery and all was in order there.  Since his last visit here he is seen neurologist at [facility].  He was told his diagnosis of Lewy body dementia was probably incorrect but he is continuing to be treated for Parkinson's disease.  Patient and wife have noticed no change in his cognitive abilities.  There have been no falls but gait remains a little unsteady.  No noticeable tremor although might be slightly worse in the morning when he has gone close to 12 hours between doses of Sinemet.  Appetite is not as good as it was and weight is down 2 pounds since last visit. ? ?Review of Systems:  ?Review of Systems  ?Constitutional:  Positive for malaise/fatigue and weight loss.  ?Respiratory: Negative.    ?Cardiovascular: Negative.   ?Genitourinary: Negative.   ?Neurological:  Positive for tremors.  ?Psychiatric/Behavioral:  Positive for depression.   ?All other systems reviewed and are negative. ? ?Past Medical History:  ?Diagnosis Date  ? AC joint separation   ? Right shoulder  ? Acute pericarditis 08/09/2018  ? Admitted 11/19 for pericarditis; ? Related to recent pacer implant; small eff on echo/no tamponade  ? Anxiety   ? Aortic regurgitation   ?  Complication of anesthesia   ? 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 Depression   ? Diastolic dysfunction   ? Hypertension   ? Left leg pain   ? Microcytic anemia   ? 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 St Jude Assurity Dual lead PPM  ? SSS (sick sinus syndrome) (HCC)   ? ?Past Surgical History:  ?Procedure Laterality Date  ? acromioclavicular separation  07/04/2016  ? CHEST TUBE INSERTION Left 08/05/2018  ? EYE SURGERY    ? HERNIA REPAIR    ? LEAD REVISION/REPAIR N/A 08/10/2018  ? Procedure: LEAD REVISION/REPAIR;  Surgeon: Deboraha Sprang, MD;  Location: [place] [facility];  Service: Cardiovascular;  Laterality: N/A;  ? NM MYOVIEW LTD    ? PACEMAKER IMPLANT N/A 08/05/2018  ? Procedure: St Jude dual lead PACEMAKER IMPLANT;  Surgeon: Sanda Klein, MD;  Location: [place] [facility];  Service: Cardiovascular;  Laterality: N/A;  ? Right ingunial hernia repair    ? US ECHOCARDIOGRAPHY    ? ?Social History: ?  reports that he has never smoked. He has never used smokeless tobacco. He reports that he does not drink alcohol and does not use drugs. ? ?Family History  ?Problem Relation Age of Onset  ? Hypertension Mother   ? Lung cancer Mother   ? Diabetes Mother   ? Thyroid disease Mother   ? Angina Father   ? Heart attack Father   ? Heart  disease Brother   ? Diabetes Brother   ? Heart attack Brother   ? Asthma Maternal Grandmother   ? Stroke Maternal Grandfather   ? Diabetes Paternal Grandfather   ? Heart Problems Brother   ? Dementia Neg Hx   ? ? ?Medications: ?Patient's Medications  ?New Prescriptions  ? No medications on file  ?Previous Medications  ? ACETAMINOPHEN (TYLENOL) 325 MG TABLET    Take 650 mg by mouth every 6 (six) hours as needed for mild pain.  ? ASPIRIN EC 81 MG TABLET    Take 81 mg by mouth daily.  ? CARBIDOPA-LEVODOPA (SINEMET IR) 25-100 MG TABLET    Take 1 tablet by mouth 3 (three) times daily. (0830, 1330, & 2030)  ? DONEPEZIL (ARICEPT) 10 MG TABLET    Take 5 mg by  mouth daily.  ? ESCITALOPRAM (LEXAPRO) 10 MG TABLET    Take 1 tablet (10 mg total) by mouth at bedtime.  ? L-METHYLFOLATE-B12-B6-B2 (CEREFOLIN) 02-28-49-5 MG TABS    TAKE 1 TABLET BY MOUTH DAILY  ? MELATONIN-PYRIDOXINE (MELATIN PO)    Take 3 mg by mouth at bedtime.  ? MIRTAZAPINE (REMERON) 15 MG TABLET    Take 7.5 mg by mouth at bedtime.  ? MOMETASONE (ELOCON) 0.1 % CREAM    Apply 1 application topically daily. To dry red scaly areas on face  ? MULTIPLE VITAMIN (MULTIVITAMIN WITH MINERALS) TABS TABLET    Take 1 tablet by mouth daily.  ?Modified Medications  ? No medications on file  ?Discontinued Medications  ? No medications on file  ? ? ?Physical Exam: ? ?Vitals:  ? 12/18/21 1354  ?BP: 132/86  ?Pulse: 63  ?Temp: (!) 97.3 ?F (36.3 ?C)  ?SpO2: 98%  ?Weight: 123 lb 9.6 oz (56.1 kg)  ?Height: '5\' 8"'$  (1.727 m)  ? ?Body mass index is 18.79 kg/m?. ?Wt Readings from Last 3 Encounters:  ?12/18/21 123 lb 9.6 oz (56.1 kg)  ?11/05/21 124 lb 9.6 oz (56.5 kg)  ?07/17/21 126 lb (57.2 kg)  ? ? ?Physical Exam ?Vitals and nursing note reviewed.  ?Constitutional:   ?   Appearance: Normal appearance.  ?Cardiovascular:  ?   Rate and Rhythm: Normal rate and regular rhythm.  ?   Pulses: Normal pulses.  ?Pulmonary:  ?   Effort: Pulmonary effort is normal.  ?   Breath sounds: Normal breath sounds.  ?Abdominal:  ?   General: Abdomen is flat. Bowel sounds are normal.  ?Musculoskeletal:  ?   Comments: Gait is broad-based but seems stable.  There is no typical parkinsonian festinating steps  ?Skin: ?   General: Skin is warm.  ?Neurological:  ?   General: No focal deficit present.  ?   Mental Status: He is alert and oriented to person, place, and time.  ?   Comments: No tremor observed ?Able to do rapid tapping of fingers ?No cogwheeling or rigidity appreciated  ?Psychiatric:     ?   Mood and Affect: Mood normal.     ?   Behavior: Behavior normal.  ? ? ?Labs reviewed: ?Basic Metabolic Panel: ?Recent Labs  ?  12/25/20 ?1501  ?NA 139  ?K 4.9  ?CL  100  ?CO2 33*  ?GLUCOSE 99  ?BUN 20  ?CREATININE 1.25*  ?CALCIUM 9.8  ? ?Liver Function Tests: ?Recent Labs  ?  12/25/20 ?1501  ?AST 19  ?ALT 4*  ?BILITOT 0.9  ?PROT 6.7  ? ?No results for input(s): LIPASE, AMYLASE in the last 8760 hours. ?No  results for input(s): AMMONIA in the last 8760 hours. ?CBC: ?Recent Labs  ?  12/25/20 ?1501  ?WBC 6.8  ?NEUTROABS 4,189  ?HGB 13.1*  ?HCT 39.7  ?MCV 94.1  ?PLT 220  ? ?Lipid Panel: ?No results for input(s): CHOL, HDL, LDLCALC, TRIG, CHOLHDL, LDLDIRECT in the last 8760 hours. ?TSH: ?No results for input(s): TSH in the last 8760 hours. ?A1C: ?No results found for: HGBA1C ? ? ?Assessment/Plan ?1. Chronic diastolic CHF (congestive heart failure) ([place]) ?No edema or shortness of breath ? ?2. Depression, major, single episode, in partial remission ([place]) ?Continues with Lexapro.  There is still some apathy.  Most of his day is spent watching TV and does not seem to get bored per history ? ?3. Hypertension, unspecified type ?Off blood pressure pills now monitoring daily.  Wife has parameters of when to give him amlodipine 2.5 mg ? ?4. Parkinson's disease ([place]) ?Takes Sinemet 3 times daily.  Tremor well controlled ? ? ? ?Alain Honey, MD ?[facility] ?[phone]  ? ?

## 2021-12-18 NOTE — Patient Instructions (Addendum)
No changes recommended in medications today.  You may taper the donepezil as we discussed  if you desire to do so ?

## 2021-12-19 LAB — BASIC METABOLIC PANEL WITH GFR
BUN: 18 mg/dL (ref 7–25)
CO2: 30 mmol/L (ref 20–32)
Calcium: 9.9 mg/dL (ref 8.6–10.3)
Chloride: 102 mmol/L (ref 98–110)
Creat: 1.03 mg/dL (ref 0.70–1.22)
Glucose, Bld: 95 mg/dL (ref 65–139)
Potassium: 4.6 mmol/L (ref 3.5–5.3)
Sodium: 143 mmol/L (ref 135–146)
eGFR: 73 mL/min/{1.73_m2} (ref >=60)

## 2022-02-05 ENCOUNTER — Ambulatory Visit (INDEPENDENT_AMBULATORY_CARE_PROVIDER_SITE_OTHER): Payer: Medicare Other

## 2022-02-05 DIAGNOSIS — I495 Sick sinus syndrome: Secondary | ICD-10-CM | POA: Diagnosis not present

## 2022-02-05 LAB — CUP PACEART REMOTE DEVICE CHECK
Battery Remaining Longevity: 88 mo
Battery Remaining Percentage: 71 %
Battery Voltage: 3.01 V
Brady Statistic AP VP Percent: 1 %
Brady Statistic AP VS Percent: 93 %
Brady Statistic AS VP Percent: 1 %
Brady Statistic AS VS Percent: 6.9 %
Brady Statistic RA Percent Paced: 93 %
Brady Statistic RV Percent Paced: 1 %
Date Time Interrogation Session: 20230509020014
Implantable Lead Implant Date: 20191106
Implantable Lead Implant Date: 20191111
Implantable Lead Location: 753859
Implantable Lead Location: 753860
Implantable Lead Model: 1948
Implantable Pulse Generator Implant Date: 20191106
Lead Channel Impedance Value: 450 Ohm
Lead Channel Impedance Value: 660 Ohm
Lead Channel Pacing Threshold Amplitude: 0.625 V
Lead Channel Pacing Threshold Amplitude: 1.125 V
Lead Channel Pacing Threshold Pulse Width: 0.5 ms
Lead Channel Pacing Threshold Pulse Width: 0.5 ms
Lead Channel Sensing Intrinsic Amplitude: 2.3 mV
Lead Channel Sensing Intrinsic Amplitude: 3.7 mV
Lead Channel Setting Pacing Amplitude: 1.375
Lead Channel Setting Pacing Amplitude: 1.625
Lead Channel Setting Pacing Pulse Width: 0.5 ms
Lead Channel Setting Sensing Sensitivity: 0.5 mV
Pulse Gen Model: 2272
Pulse Gen Serial Number: 9083517

## 2022-02-12 DIAGNOSIS — M79676 Pain in unspecified toe(s): Secondary | ICD-10-CM | POA: Diagnosis not present

## 2022-02-12 DIAGNOSIS — B351 Tinea unguium: Secondary | ICD-10-CM | POA: Diagnosis not present

## 2022-02-12 DIAGNOSIS — L84 Corns and callosities: Secondary | ICD-10-CM | POA: Diagnosis not present

## 2022-02-19 NOTE — Progress Notes (Signed)
Remote pacemaker transmission.   

## 2022-05-07 ENCOUNTER — Ambulatory Visit (INDEPENDENT_AMBULATORY_CARE_PROVIDER_SITE_OTHER): Payer: Medicare Other

## 2022-05-07 DIAGNOSIS — I495 Sick sinus syndrome: Secondary | ICD-10-CM

## 2022-05-07 LAB — CUP PACEART REMOTE DEVICE CHECK
Battery Remaining Longevity: 85 mo
Battery Remaining Percentage: 69 %
Battery Voltage: 2.99 V
Brady Statistic AP VP Percent: 1 %
Brady Statistic AP VS Percent: 92 %
Brady Statistic AS VP Percent: 1 %
Brady Statistic AS VS Percent: 7.5 %
Brady Statistic RA Percent Paced: 92 %
Brady Statistic RV Percent Paced: 1 %
Date Time Interrogation Session: 20230808020015
Implantable Lead Implant Date: 20191106
Implantable Lead Implant Date: 20191111
Implantable Lead Location: 753859
Implantable Lead Location: 753860
Implantable Lead Model: 1948
Implantable Pulse Generator Implant Date: 20191106
Lead Channel Impedance Value: 430 Ohm
Lead Channel Impedance Value: 640 Ohm
Lead Channel Pacing Threshold Amplitude: 0.5 V
Lead Channel Pacing Threshold Amplitude: 1 V
Lead Channel Pacing Threshold Pulse Width: 0.5 ms
Lead Channel Pacing Threshold Pulse Width: 0.5 ms
Lead Channel Sensing Intrinsic Amplitude: 3 mV
Lead Channel Sensing Intrinsic Amplitude: 4.1 mV
Lead Channel Setting Pacing Amplitude: 1.25 V
Lead Channel Setting Pacing Amplitude: 1.5 V
Lead Channel Setting Pacing Pulse Width: 0.5 ms
Lead Channel Setting Sensing Sensitivity: 0.5 mV
Pulse Gen Model: 2272
Pulse Gen Serial Number: 9083517

## 2022-05-21 DIAGNOSIS — B351 Tinea unguium: Secondary | ICD-10-CM | POA: Diagnosis not present

## 2022-05-21 DIAGNOSIS — I70203 Unspecified atherosclerosis of native arteries of extremities, bilateral legs: Secondary | ICD-10-CM | POA: Diagnosis not present

## 2022-05-21 DIAGNOSIS — L84 Corns and callosities: Secondary | ICD-10-CM | POA: Diagnosis not present

## 2022-05-21 DIAGNOSIS — M79676 Pain in unspecified toe(s): Secondary | ICD-10-CM | POA: Diagnosis not present

## 2022-06-12 NOTE — Progress Notes (Signed)
Remote pacemaker transmission.   

## 2022-06-25 ENCOUNTER — Ambulatory Visit: Payer: Medicare Other | Admitting: Family Medicine

## 2022-07-03 ENCOUNTER — Encounter: Payer: Self-pay | Admitting: Family Medicine

## 2022-07-03 ENCOUNTER — Ambulatory Visit (INDEPENDENT_AMBULATORY_CARE_PROVIDER_SITE_OTHER): Payer: Medicare Other | Admitting: Family Medicine

## 2022-07-03 VITALS — BP 128/66 | HR 63 | Temp 98.1°F | Resp 17 | Ht 68.0 in | Wt 125.8 lb

## 2022-07-03 DIAGNOSIS — Z23 Encounter for immunization: Secondary | ICD-10-CM

## 2022-07-03 DIAGNOSIS — G20A1 Parkinson's disease without dyskinesia, without mention of fluctuations: Secondary | ICD-10-CM | POA: Diagnosis not present

## 2022-07-03 DIAGNOSIS — I10 Essential (primary) hypertension: Secondary | ICD-10-CM | POA: Diagnosis not present

## 2022-07-03 DIAGNOSIS — R2681 Unsteadiness on feet: Secondary | ICD-10-CM

## 2022-07-03 DIAGNOSIS — F324 Major depressive disorder, single episode, in partial remission: Secondary | ICD-10-CM | POA: Diagnosis not present

## 2022-07-03 NOTE — Progress Notes (Signed)
Provider:  Alain Honey, MD  Careteam: Patient Care Team: Wardell Honour, MD as PCP - General (Family Medicine) Croitoru, Dani Gobble, MD as PCP - Cardiology (Cardiology)  PLACE OF SERVICE:  [place] Directive information Does Patient Have a Medical Advance Directive?: No, Would patient like information on creating a medical advance directive?: No - Patient declined  Allergies  Allergen Reactions   Propofol Anaphylaxis    Chief Complaint  Patient presents with   Medical Management of Chronic Issues    6 month follow up.   Immunizations    Discussed the need for Covid, Shingles, and flu vaccines. [facility] verified     HPI: Patient is a 81 y.o. male patient presents for medical management of chronic problems including Parkinson's, depression, chronic diastolic heart failure, sick sinus syndrome, hypertension,.  Wife reports no significant changes since last visit 6 months ago.  Memory is no worse.  There have been no hallucinations.  Tremor has not changed.  There is no dysphagia or constipation.  He has actually gained 2 pounds since last visit 6 months ago.  Review of Systems:  Review of Systems  Constitutional:  Positive for malaise/fatigue.  HENT: Negative.    Eyes: Negative.   Respiratory: Negative.    Cardiovascular: Negative.   Gastrointestinal:  Positive for diarrhea.  Genitourinary: Negative.   Musculoskeletal:  Positive for falls.  Neurological:  Positive for weakness.  Psychiatric/Behavioral:  Positive for depression.   All other systems reviewed and are negative.   Past Medical History:  Diagnosis Date   AC joint separation    Right shoulder   Acute pericarditis 08/09/2018   Admitted 11/19 for pericarditis; ? 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   St Jude Assurity Dual lead PPM   SSS (sick sinus syndrome) ([place])    Past Surgical History:  Procedure Laterality Date   acromioclavicular separation  07/04/2016   CHEST TUBE INSERTION Left 08/05/2018   EYE SURGERY     HERNIA REPAIR     LEAD REVISION/REPAIR N/A 08/10/2018   Procedure: LEAD REVISION/REPAIR;  Surgeon: Deboraha Sprang, MD;  Location: [place];  Service: Cardiovascular;  Laterality: N/A;   NM MYOVIEW LTD     PACEMAKER IMPLANT N/A 08/05/2018   Procedure: St Jude dual lead PACEMAKER IMPLANT;  Surgeon: Sanda Klein, MD;  Location: [place] [facility];  Service: Cardiovascular;  Laterality: N/A;   Right ingunial hernia repair     US ECHOCARDIOGRAPHY     Social History:   reports that he has never smoked. He has never used smokeless tobacco. He reports that he does not drink alcohol and does not use drugs.  Family History  Problem Relation Age of Onset   Hypertension Mother    Lung cancer Mother    Diabetes Mother    Thyroid disease Mother    Angina Father    Heart attack Father    Heart disease Brother    Diabetes Brother    Heart attack Brother    Asthma Maternal Grandmother    Stroke Maternal Grandfather    Diabetes Paternal Grandfather    Heart  Problems Brother    Dementia Neg Hx     Medications: Patient's Medications  New Prescriptions   No medications on file  Previous Medications   ACETAMINOPHEN (TYLENOL) 325 MG TABLET    Take 650 mg by mouth every 6 (six) hours as needed for mild pain.   ASPIRIN EC 81 MG TABLET    Take 81 mg by mouth daily.   CARBIDOPA-LEVODOPA (SINEMET IR) 25-100 MG TABLET    Take 1 tablet by mouth 3 (three) times daily. (0830, 1330, & 2030)   DONEPEZIL (ARICEPT) 10 MG TABLET    Take 5 mg by mouth daily.   ESCITALOPRAM (LEXAPRO) 10 MG TABLET    Take 1 tablet (10 mg total) by mouth at bedtime.    L-METHYLFOLATE-B12-B6-B2 (CEREFOLIN) 02-28-49-5 MG TABS    TAKE 1 TABLET BY MOUTH DAILY   MELATONIN-PYRIDOXINE (MELATIN PO)    Take 3 mg by mouth at bedtime.   MIRTAZAPINE (REMERON) 15 MG TABLET    Take 7.5 mg by mouth at bedtime.   MOMETASONE (ELOCON) 0.1 % CREAM    Apply 1 application topically daily. To dry red scaly areas on face   MULTIPLE VITAMIN (MULTIVITAMIN WITH MINERALS) TABS TABLET    Take 1 tablet by mouth daily.  Modified Medications   No medications on file  Discontinued Medications   No medications on file    Physical Exam:  Vitals:   07/03/22 1302  BP: 128/66  Pulse: 63  Resp: 17  Temp: 98.1 F (36.7 C)  TempSrc: Temporal  SpO2: 100%  Weight: 125 lb 12.8 oz (57.1 kg)  Height: '5\' 8"'$  (1.727 m)   Body mass index is 19.13 kg/m. Wt Readings from Last 3 Encounters:  07/03/22 125 lb 12.8 oz (57.1 kg)  12/18/21 123 lb 9.6 oz (56.1 kg)  11/05/21 124 lb 9.6 oz (56.5 kg)    Physical Exam Constitutional:      Appearance: Normal appearance.  HENT:     Head: Normocephalic.     Mouth/Throat:     Mouth: Mucous membranes are moist.     Pharynx: Oropharynx is clear.  Cardiovascular:     Rate and Rhythm: Normal rate and regular rhythm.  Pulmonary:     Effort: Pulmonary effort is normal.     Breath sounds: Normal breath sounds.  Abdominal:     General: Abdomen is flat. Bowel sounds are normal.     Palpations: Abdomen is soft.  Musculoskeletal:     Comments: Gait shows no shuffling and is almost normal  Neurological:     General: No focal deficit present.     Mental Status: He is alert and oriented to person, place, and time.     Comments: Recalls what he ate for supper last night  Psychiatric:        Mood and Affect: Mood normal.        Behavior: Behavior normal.     Comments: There are some aberrant behaviors.  He likes to stand up when he eats 's sleeping okay     Labs reviewed: Basic Metabolic Panel: Recent Labs    12/18/21 1431  NA 143  K 4.6  CL  102  CO2 30  GLUCOSE 95  BUN 18  CREATININE 1.03  CALCIUM 9.9   Liver Function Tests: No results for input(s): "AST", "ALT", "ALKPHOS", "BILITOT", "PROT", "ALBUMIN" in the last 8760 hours. No results for input(s): "LIPASE", "AMYLASE" in the last 8760 hours. No results for input(s): "AMMONIA" in the last 8760  hours. CBC: No results for input(s): "WBC", "NEUTROABS", "HGB", "HCT", "MCV", "PLT" in the last 8760 hours. Lipid Panel: No results for input(s): "CHOL", "HDL", "LDLCALC", "TRIG", "CHOLHDL", "LDLDIRECT" in the last 8760 hours. TSH: No results for input(s): "TSH" in the last 8760 hours. A1C: No results found for: "HGBA1C"   Assessment/Plan  1. Need for influenza vaccination Flu shot given  2. Depression, major, single episode, in partial remission ([place]) Patient continues with Lexapro 10 mg  3. Hypertension, unspecified type He is off blood pressure medicines.  Wife monitors and has amlodipine 2.5 to use if pressures greater than 160/100  4. Parkinson's disease without dyskinesia or fluctuating manifestations Continues to take Sinemet twice a day scratch that 3 times daily and donepezil 10 mg  5. Unsteady gait when walking Observed patient walking.  Gait is fairly stable.  There is no festinating gait   Alain Honey, MD [place] [phone]

## 2022-07-03 NOTE — Patient Instructions (Signed)
No change in meds Pat attention to areas we discussed where falls are more common

## 2022-07-12 ENCOUNTER — Other Ambulatory Visit: Payer: Self-pay | Admitting: Family

## 2022-07-12 DIAGNOSIS — F324 Major depressive disorder, single episode, in partial remission: Secondary | ICD-10-CM

## 2022-07-12 NOTE — Telephone Encounter (Signed)
Patient has request refill on medication Escitalopram '10mg'$ . Patient medication has High Risk Warnings. Medication pend and sent to Sherrie Mustache, NP due to PCP Sabra Heck Lillette Boxer, MD being out of office.

## 2022-08-06 ENCOUNTER — Ambulatory Visit (INDEPENDENT_AMBULATORY_CARE_PROVIDER_SITE_OTHER): Payer: Medicare Other

## 2022-08-06 DIAGNOSIS — I495 Sick sinus syndrome: Secondary | ICD-10-CM

## 2022-08-06 LAB — CUP PACEART REMOTE DEVICE CHECK
Battery Remaining Longevity: 82 mo
Battery Remaining Percentage: 67 %
Battery Voltage: 2.99 V
Brady Statistic AP VP Percent: 1 %
Brady Statistic AP VS Percent: 92 %
Brady Statistic AS VP Percent: 1 %
Brady Statistic AS VS Percent: 8.1 %
Brady Statistic RA Percent Paced: 92 %
Brady Statistic RV Percent Paced: 1 %
Date Time Interrogation Session: 20231107020014
Implantable Lead Connection Status: 753985
Implantable Lead Connection Status: 753985
Implantable Lead Implant Date: 20191106
Implantable Lead Implant Date: 20191111
Implantable Lead Location: 753859
Implantable Lead Location: 753860
Implantable Lead Model: 1948
Implantable Pulse Generator Implant Date: 20191106
Lead Channel Impedance Value: 410 Ohm
Lead Channel Impedance Value: 610 Ohm
Lead Channel Pacing Threshold Amplitude: 0.625 V
Lead Channel Pacing Threshold Amplitude: 0.75 V
Lead Channel Pacing Threshold Pulse Width: 0.5 ms
Lead Channel Pacing Threshold Pulse Width: 0.5 ms
Lead Channel Sensing Intrinsic Amplitude: 1.4 mV
Lead Channel Sensing Intrinsic Amplitude: 4.1 mV
Lead Channel Setting Pacing Amplitude: 1 V
Lead Channel Setting Pacing Amplitude: 1.625
Lead Channel Setting Pacing Pulse Width: 0.5 ms
Lead Channel Setting Sensing Sensitivity: 0.5 mV
Pulse Gen Model: 2272
Pulse Gen Serial Number: 9083517

## 2022-08-29 DIAGNOSIS — Z888 Allergy status to other drugs, medicaments and biological substances status: Secondary | ICD-10-CM | POA: Diagnosis not present

## 2022-08-29 DIAGNOSIS — G20A1 Parkinson's disease without dyskinesia, without mention of fluctuations: Secondary | ICD-10-CM | POA: Diagnosis not present

## 2022-08-29 DIAGNOSIS — Z79899 Other long term (current) drug therapy: Secondary | ICD-10-CM | POA: Diagnosis not present

## 2022-08-29 DIAGNOSIS — F0280 Dementia in other diseases classified elsewhere without behavioral disturbance: Secondary | ICD-10-CM | POA: Diagnosis not present

## 2022-09-02 NOTE — Progress Notes (Signed)
Remote pacemaker transmission.   

## 2022-09-03 ENCOUNTER — Other Ambulatory Visit: Payer: Self-pay | Admitting: Family Medicine

## 2022-09-04 ENCOUNTER — Other Ambulatory Visit: Payer: Self-pay

## 2022-09-04 ENCOUNTER — Telehealth: Payer: Self-pay

## 2022-09-04 MED ORDER — CEREFOLIN 6-1-50-5 MG PO TABS: 1.0000 | ORAL_TABLET | Freq: Every day | ORAL | 2 refills | Status: DC

## 2022-09-04 NOTE — Telephone Encounter (Signed)
Incoming fax received from [facility] stating generic Cerefolin (L-Methylfolate-b12-B6-B2) is unavailable and the provider needs to send in a different product.

## 2022-09-05 NOTE — Telephone Encounter (Signed)
Awaiting reply from [facility]. I will also forward to Janett Billow whom covers [place] in the am on Thursday

## 2022-09-09 NOTE — Telephone Encounter (Signed)
Will let Dr Migdalia Dk address

## 2022-09-09 NOTE — Telephone Encounter (Signed)
Awaiting reply from providers

## 2022-09-11 NOTE — Telephone Encounter (Signed)
Would use OTC B12 in place of this Rx('1000mg'$ )

## 2022-09-12 MED ORDER — VITAMIN B-12 1000 MCG PO TABS: 1000.0000 ug | ORAL_TABLET | Freq: Every day | ORAL | 11 refills | Status: DC

## 2022-09-12 NOTE — Telephone Encounter (Signed)
Medication list updated. I left a detailed voicemail informing patient of Dr.Miller's response

## 2022-09-12 NOTE — Addendum Note (Signed)
Addended by: Logan Bores on: 09/12/2022 04:58 PM   Modules accepted: Orders

## 2022-09-16 ENCOUNTER — Telehealth: Payer: Self-pay

## 2022-09-16 MED ORDER — CEREFOLIN 6-1-50-5 MG PO TABS: 1.0000 | ORAL_TABLET | Freq: Every day | ORAL | 0 refills | Status: DC

## 2022-09-16 NOTE — Telephone Encounter (Signed)
Patient's wife called about the vitamin b-12. She reports that patient only takes Cerefolin and they received the refill for the medication already. She requested that Vitamin B-12 be removed off his medication list because patient doesn't take that medication and he gets the Cerefolin from a pharmacy called [facility] and they was just needing an approval from [place] to refill the medication.

## 2022-09-24 ENCOUNTER — Encounter: Payer: Self-pay | Admitting: Family

## 2022-09-24 ENCOUNTER — Ambulatory Visit (INDEPENDENT_AMBULATORY_CARE_PROVIDER_SITE_OTHER): Payer: Medicare Other | Admitting: Family

## 2022-09-24 VITALS — BP 118/70 | HR 66 | Temp 97.3°F | Resp 16 | Ht 68.0 in | Wt 120.2 lb

## 2022-09-24 DIAGNOSIS — R051 Acute cough: Secondary | ICD-10-CM

## 2022-09-24 DIAGNOSIS — R0989 Other specified symptoms and signs involving the circulatory and respiratory systems: Secondary | ICD-10-CM | POA: Diagnosis not present

## 2022-09-24 DIAGNOSIS — J029 Acute pharyngitis, unspecified: Secondary | ICD-10-CM | POA: Diagnosis not present

## 2022-09-24 LAB — POCT INFLUENZA A/B
Influenza A, POC: NEGATIVE
Influenza B, POC: NEGATIVE

## 2022-09-24 LAB — POCT RAPID STREP A (OFFICE): Rapid Strep A Screen: NEGATIVE

## 2022-09-24 LAB — POC COVID19 BINAXNOW: SARS Coronavirus 2 Ag: NEGATIVE

## 2022-09-24 MED ORDER — AZITHROMYCIN 250 MG PO TABS: ORAL_TABLET | ORAL | 0 refills | Status: AC

## 2022-09-24 MED ORDER — GUAIFENESIN-DM 100-10 MG/5ML PO SYRP: 5.0000 mL | ORAL_SOLUTION | Freq: Three times a day (TID) | ORAL | 0 refills | Status: DC | PRN

## 2022-09-24 MED ORDER — LORATADINE 10 MG PO TABS: 10.0000 mg | ORAL_TABLET | Freq: Every day | ORAL | 11 refills | Status: DC

## 2022-09-24 NOTE — Patient Instructions (Signed)
-   Gurgle throat with warm water and salt at least once daily  - Take OTC tylenol as needed for pain  - Take OTC Robitussin DM 5 ml for cough  - Increase fluid intake/warm tea or soup  - Notify provider if symptoms worsen or fail to improve

## 2022-09-24 NOTE — Progress Notes (Signed)
Provider: Giovany Cosby FNP-C  Wardell Honour, MD  Patient Care Team: Wardell Honour, MD as PCP - General (Family Medicine) Sanda Klein, MD as PCP - Cardiology (Cardiology)  Extended Emergency Contact Information Primary Emergency Contact: Werling,[name] H Address: [address]          [place], [place] [address] Johnnette Litter of [place] Phone: [phone] Mobile Phone: [phone] Relation: Spouse Secondary Emergency Contact: Layne Benton Address: [place], [place] [address] [place] Phone: [phone] Mobile Phone: [phone] Relation: Daughter  Code Status:  Full Code  Goals of care: Advanced Directive information    09/24/2022    2:22 PM  Advanced Directives  Does Patient Have a Medical Advance Directive? Yes  Type of Advance Directive [place]  Does patient want to make changes to medical advance directive? No - Patient declined  Copy of [place] in Chart? No - copy requested     Chief Complaint  Patient presents with   Acute Visit    Patient complains of sinus infection. Symptoms are runny nose, coughing, and sore throat.    HPI:  Pt is a 81 y.o. male seen today for an acute visit for evaluation of sore throat,cough,runny nose x 3-4 days.wife has had similar symptoms.wife Had COVID-19 since thanksgiving.  He denies fever,chills,chest tightness,chest pain,palpitation or shortness of breath. Appetite is good.Has been drinking fluids too.   Past Medical History:  Diagnosis Date   AC joint separation    Right shoulder   Acute pericarditis 08/09/2018   Admitted 11/19 for pericarditis; ? 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;  Service: Cardiovascular;  Laterality: N/A;   Right ingunial hernia repair     US ECHOCARDIOGRAPHY      Allergies  Allergen Reactions   Propofol Anaphylaxis    Outpatient Encounter Medications as of 09/24/2022  Medication Sig   acetaminophen (TYLENOL) 325 MG tablet Take 650 mg by mouth every 6 (six) hours as needed for mild pain.   aspirin EC 81 MG tablet Take 81 mg by mouth daily.   carbidopa-levodopa (SINEMET IR) 25-100 MG tablet Take 1 tablet by mouth 3 (three) times daily. (0830, 1330, & 2030)   donepezil (ARICEPT) 10 MG tablet Take 5 mg by mouth daily.   escitalopram (LEXAPRO) 10 MG tablet TAKE 1 TABLET BY MOUTH AT BEDTIME   L-Methylfolate-B12-B6-B2 (CEREFOLIN) 02-28-49-5 MG TABS Take 1 tablet  by mouth daily.   Melatonin-Pyridoxine (MELATIN PO) Take 3 mg by mouth at bedtime.   mirtazapine (REMERON) 15 MG tablet Take 7.5 mg by mouth at bedtime.   mometasone (ELOCON) 0.1 % cream Apply 1 application topically daily. To dry red scaly areas on face   Multiple Vitamin (MULTIVITAMIN WITH MINERALS) TABS tablet Take 1 tablet by mouth daily.   No facility-administered encounter medications on file as of 09/24/2022.    Review of Systems  Constitutional:  Negative for appetite change, chills, fatigue, fever and unexpected weight change.  HENT:  Positive for congestion,  rhinorrhea and sore throat. Negative for dental problem, ear discharge, ear pain, facial swelling, hearing loss, nosebleeds, postnasal drip, sinus pressure, sinus pain, sneezing, tinnitus and trouble swallowing.   Eyes:  Negative for pain, discharge, redness, itching and visual disturbance.  Respiratory:  Positive for cough. Negative for chest tightness, shortness of breath and wheezing.   Cardiovascular:  Negative for chest pain, palpitations and leg swelling.  Gastrointestinal:  Negative for abdominal distention, abdominal pain, constipation, diarrhea, nausea and vomiting.  Skin:  Negative for color change, pallor and rash.  Neurological:  Negative for dizziness, light-headedness and headaches.  Psychiatric/Behavioral:  Negative for agitation, behavioral problems, confusion, hallucinations and sleep disturbance. The patient is not nervous/anxious.     Immunization History  Administered Date(s) Administered   Fluad Quad(high Dose 65+) 05/20/2019, 07/24/2020, 07/17/2021, 07/03/2022   Influenza, High Dose Seasonal PF 07/14/2014   Influenza-Unspecified 06/27/2011, 06/26/2012, 06/21/2013, 07/14/2014, 06/14/2015, 07/17/2016, 05/31/2017, 05/31/2018   PFIZER(Purple Top)SARS-COV-2 Vaccination 11/07/2019, 11/25/2019, 09/08/2020   Pfizer Covid-19 Vaccine Bivalent Booster 37yr & up 08/31/2021   Pneumococcal Conjugate-13 08/09/2017   Pneumococcal Polysaccharide-23 11/23/2020   Pneumococcal-Unspecified 03/22/2006   Tdap 06/20/2009, 11/07/2019   Pertinent  Health Maintenance Due  Topic Date Due   INFLUENZA VACCINE  Completed      12/25/2020    2:14 PM 03/27/2021    2:31 PM 07/17/2021   11:02 AM 07/03/2022    1:06 PM 09/24/2022    2:22 PM  Fall Risk  Falls in the past year? 0 0 0 0 0  Was there an injury with Fall? 0 0 0 0 0  Fall Risk Category Calculator 0 0 0 0 0  Fall Risk Category Low Low Low Low Low  Patient Fall Risk Level Low fall risk Low fall risk Low fall risk Low fall risk Low fall  risk  Patient at Risk for Falls Due to  History of fall(s) History of fall(s) History of fall(s) No Fall Risks  Fall risk Follow up  Falls evaluation completed;Education provided;Falls prevention discussed Falls evaluation completed;Education provided;Falls prevention discussed Falls evaluation completed Falls evaluation completed   Functional Status Survey:    Vitals:   09/24/22 1423  BP: 118/70  Pulse: 66  Resp: 16  Temp: (!) 97.3 F (36.3 C)  SpO2: 98%  Weight: 120 lb 3.2 oz (54.5 kg)  Height: '5\' 8"'$  (1.727 m)   Body mass index is 18.28 kg/m. Physical Exam Vitals reviewed.  Constitutional:      General: He is not in acute distress.    Appearance: Normal appearance. He is underweight. He is not ill-appearing or diaphoretic.  HENT:     Head: Normocephalic.     Right Ear: Tympanic membrane, ear canal and external ear normal. There is no impacted cerumen.     Left Ear: Tympanic membrane, ear canal and external ear normal. There is no impacted cerumen.     Nose: Congestion and rhinorrhea  present.     Mouth/Throat:     Mouth: Mucous membranes are moist.     Pharynx: Oropharyngeal exudate and posterior oropharyngeal erythema present.  Eyes:     General: No scleral icterus.       Right eye: No discharge.        Left eye: No discharge.     Extraocular Movements: Extraocular movements intact.     Conjunctiva/sclera: Conjunctivae normal.     Pupils: Pupils are equal, round, and reactive to light.  Cardiovascular:     Rate and Rhythm: Normal rate and regular rhythm.     Pulses: Normal pulses.     Heart sounds: Normal heart sounds. No murmur heard.    No friction rub. No gallop.  Pulmonary:     Effort: Pulmonary effort is normal. No respiratory distress.     Breath sounds: Normal breath sounds. No wheezing, rhonchi or rales.  Chest:     Chest wall: No tenderness.  Abdominal:     General: Bowel sounds are normal. There is no distension.     Palpations: Abdomen is soft. There  is no mass.     Tenderness: There is no abdominal tenderness. There is no right CVA tenderness, left CVA tenderness, guarding or rebound.  Musculoskeletal:     Cervical back: Normal range of motion. No rigidity or tenderness.  Lymphadenopathy:     Head:     Right side of head: Tonsillar adenopathy present.     Left side of head: Tonsillar adenopathy present.     Cervical: No cervical adenopathy.  Skin:    General: Skin is warm and dry.     Coloration: Skin is not pale.     Findings: No erythema or rash.  Neurological:     Mental Status: He is alert. Mental status is at baseline.     Motor: No weakness.     Gait: Gait abnormal.  Psychiatric:        Mood and Affect: Mood normal.        Speech: Speech normal.        Behavior: Behavior normal.     Labs reviewed: Recent Labs    12/18/21 1431  NA 143  K 4.6  CL 102  CO2 30  GLUCOSE 95  BUN 18  CREATININE 1.03  CALCIUM 9.9   No results for input(s): "AST", "ALT", "ALKPHOS", "BILITOT", "PROT", "ALBUMIN" in the last 8760 hours. No results for input(s): "WBC", "NEUTROABS", "HGB", "HCT", "MCV", "PLT" in the last 8760 hours. Lab Results  Component Value Date   TSH 3.58 02/24/2019   No results found for: "HGBA1C" Lab Results  Component Value Date   CHOL 222 (H) 02/24/2019   HDL 118 02/24/2019   LDLCALC 86 02/24/2019   TRIG 88 02/24/2019   CHOLHDL 1.9 02/24/2019    Significant Diagnostic Results in last 30 days:  No results found.  Assessment/Plan  1. Sore throat Afebrile  Posterior pharynx erythema with swollen tonsillar and tender to palpation with whitish exudate noted.COVID-19,influenza and strep all negative.will treat clinically with Z-pak as below.side effects discussed.   - POC COVID-19 - POC Influenza A/B - POC Rapid Strep A - azithromycin (ZITHROMAX) 250 MG tablet; Take 2 tablets on day 1, then 1 tablet daily on days 2 through 5  Dispense: 6 tablet; Refill: 0  2. Acute cough Afebrile. - Bilateral lungs  clear on exam  - Robitussin DM as below.  - POC COVID-19 - POC Influenza A/B - POC Rapid Strep  A - guaiFENesin-dextromethorphan (ROBITUSSIN DM) 100-10 MG/5ML syrup; Take 5 mLs by mouth every 8 (eight) hours as needed for cough.  Dispense: 118 mL; Refill: 0  3. Runny nose Afebrile  - advised to take OTC loratadine for 2 weeks  then d/c  - loratadine (CLARITIN) 10 MG tablet; Take 1 tablet (10 mg total) by mouth daily.  Dispense: 30 tablet; Refill: 0  Family/ staff Communication: Reviewed plan of care with patient,wife and son verbalized understanding.   Labs/tests ordered:  - POC COVID-19 - POC Influenza A/B - POC Rapid Strep A  Next Appointment: Return if symptoms worsen or fail to improve.   Sandrea Hughs, NP

## 2022-10-09 ENCOUNTER — Telehealth: Payer: Self-pay

## 2022-10-09 NOTE — Telephone Encounter (Signed)
He should come here for an OV; maybe we can help

## 2022-10-09 NOTE — Telephone Encounter (Signed)
Patient's wife called stating that patient is having trouble getting up out of bed. She states that when he gets up his eyes are crsuted over and he can't open them. She uses a warm rag to clean them out and then he is fine. She would like to know what she should do. Should she see the eye doctor about this issue.  Message routed to Dr. Alain Honey

## 2022-11-05 ENCOUNTER — Ambulatory Visit: Payer: Medicare Other

## 2022-11-05 DIAGNOSIS — I495 Sick sinus syndrome: Secondary | ICD-10-CM | POA: Diagnosis not present

## 2022-11-05 LAB — CUP PACEART REMOTE DEVICE CHECK
Battery Remaining Longevity: 79 mo
Battery Remaining Percentage: 64 %
Battery Voltage: 2.99 V
Brady Statistic AP VP Percent: 1 %
Brady Statistic AP VS Percent: 91 %
Brady Statistic AS VP Percent: 1 %
Brady Statistic AS VS Percent: 9.2 %
Brady Statistic RA Percent Paced: 90 %
Brady Statistic RV Percent Paced: 1 %
Date Time Interrogation Session: 20240206020029
Implantable Lead Connection Status: 753985
Implantable Lead Connection Status: 753985
Implantable Lead Implant Date: 20191106
Implantable Lead Implant Date: 20191111
Implantable Lead Location: 753859
Implantable Lead Location: 753860
Implantable Lead Model: 1948
Implantable Pulse Generator Implant Date: 20191106
Lead Channel Impedance Value: 430 Ohm
Lead Channel Impedance Value: 580 Ohm
Lead Channel Pacing Threshold Amplitude: 0.625 V
Lead Channel Pacing Threshold Amplitude: 0.625 V
Lead Channel Pacing Threshold Pulse Width: 0.5 ms
Lead Channel Pacing Threshold Pulse Width: 0.5 ms
Lead Channel Sensing Intrinsic Amplitude: 1.9 mV
Lead Channel Sensing Intrinsic Amplitude: 3.2 mV
Lead Channel Setting Pacing Amplitude: 0.875
Lead Channel Setting Pacing Amplitude: 1.625
Lead Channel Setting Pacing Pulse Width: 0.5 ms
Lead Channel Setting Sensing Sensitivity: 0.5 mV
Pulse Gen Model: 2272
Pulse Gen Serial Number: 9083517

## 2022-11-26 DIAGNOSIS — L84 Corns and callosities: Secondary | ICD-10-CM | POA: Diagnosis not present

## 2022-11-26 DIAGNOSIS — B351 Tinea unguium: Secondary | ICD-10-CM | POA: Diagnosis not present

## 2022-11-26 DIAGNOSIS — M79676 Pain in unspecified toe(s): Secondary | ICD-10-CM | POA: Diagnosis not present

## 2022-11-26 DIAGNOSIS — I70203 Unspecified atherosclerosis of native arteries of extremities, bilateral legs: Secondary | ICD-10-CM | POA: Diagnosis not present

## 2022-11-29 DIAGNOSIS — G20C Parkinsonism, unspecified: Secondary | ICD-10-CM | POA: Diagnosis not present

## 2022-11-29 DIAGNOSIS — E440 Moderate protein-calorie malnutrition: Secondary | ICD-10-CM | POA: Diagnosis not present

## 2022-11-29 DIAGNOSIS — Z515 Encounter for palliative care: Secondary | ICD-10-CM | POA: Diagnosis not present

## 2022-11-29 DIAGNOSIS — F0671 Mild neurocognitive disorder due to known physiological condition with behavioral disturbance: Secondary | ICD-10-CM | POA: Diagnosis not present

## 2022-12-10 NOTE — Progress Notes (Signed)
Remote pacemaker transmission.   

## 2023-01-01 ENCOUNTER — Encounter: Payer: Medicare Other | Admitting: Family Medicine

## 2023-01-02 ENCOUNTER — Other Ambulatory Visit: Payer: Self-pay | Admitting: Nurse Practitioner

## 2023-01-02 DIAGNOSIS — F324 Major depressive disorder, single episode, in partial remission: Secondary | ICD-10-CM

## 2023-01-02 NOTE — Telephone Encounter (Signed)
Pharmacy requested refill.   Pended Rx and sent to Jessica for approval due to HIGH ALERT Warning. (Dr. Miller out of office) 

## 2023-01-06 DIAGNOSIS — Z515 Encounter for palliative care: Secondary | ICD-10-CM | POA: Diagnosis not present

## 2023-01-06 DIAGNOSIS — E440 Moderate protein-calorie malnutrition: Secondary | ICD-10-CM | POA: Diagnosis not present

## 2023-01-06 DIAGNOSIS — F0671 Mild neurocognitive disorder due to known physiological condition with behavioral disturbance: Secondary | ICD-10-CM | POA: Diagnosis not present

## 2023-01-06 DIAGNOSIS — G20C Parkinsonism, unspecified: Secondary | ICD-10-CM | POA: Diagnosis not present

## 2023-01-08 NOTE — Progress Notes (Signed)
This encounter was created in error - please disregard.

## 2023-01-22 ENCOUNTER — Telehealth: Payer: Self-pay

## 2023-01-22 NOTE — Telephone Encounter (Signed)
Patient's wife alled stating that she would like to have some one come out to assess and treat pressure ulcer on patient's back.  Message routed to Dr. Jacalyn Lefevre

## 2023-01-23 ENCOUNTER — Other Ambulatory Visit: Payer: Self-pay | Admitting: Family Medicine

## 2023-01-23 NOTE — Telephone Encounter (Signed)
To complete Home Health request, I think pt will need more recent OV; I have not seen him in 1 year

## 2023-02-04 ENCOUNTER — Ambulatory Visit (INDEPENDENT_AMBULATORY_CARE_PROVIDER_SITE_OTHER): Payer: Medicare Other

## 2023-02-04 DIAGNOSIS — I495 Sick sinus syndrome: Secondary | ICD-10-CM | POA: Diagnosis not present

## 2023-02-04 LAB — CUP PACEART REMOTE DEVICE CHECK
Battery Remaining Longevity: 76 mo
Battery Remaining Percentage: 62 %
Battery Voltage: 2.99 V
Brady Statistic AP VP Percent: 1 %
Brady Statistic AP VS Percent: 90 %
Brady Statistic AS VP Percent: 1 %
Brady Statistic AS VS Percent: 10 %
Brady Statistic RA Percent Paced: 89 %
Brady Statistic RV Percent Paced: 1 %
Date Time Interrogation Session: 20240507020016
Implantable Lead Connection Status: 753985
Implantable Lead Connection Status: 753985
Implantable Lead Implant Date: 20191106
Implantable Lead Implant Date: 20191111
Implantable Lead Location: 753859
Implantable Lead Location: 753860
Implantable Lead Model: 1948
Implantable Pulse Generator Implant Date: 20191106
Lead Channel Impedance Value: 410 Ohm
Lead Channel Impedance Value: 580 Ohm
Lead Channel Pacing Threshold Amplitude: 0.5 V
Lead Channel Pacing Threshold Amplitude: 0.75 V
Lead Channel Pacing Threshold Pulse Width: 0.5 ms
Lead Channel Pacing Threshold Pulse Width: 0.5 ms
Lead Channel Sensing Intrinsic Amplitude: 1.5 mV
Lead Channel Sensing Intrinsic Amplitude: 3.6 mV
Lead Channel Setting Pacing Amplitude: 1 V
Lead Channel Setting Pacing Amplitude: 1.5 V
Lead Channel Setting Pacing Pulse Width: 0.5 ms
Lead Channel Setting Sensing Sensitivity: 0.5 mV
Pulse Gen Model: 2272
Pulse Gen Serial Number: 9083517

## 2023-02-05 ENCOUNTER — Ambulatory Visit: Payer: Medicare Other | Admitting: Family Medicine

## 2023-02-10 DIAGNOSIS — E440 Moderate protein-calorie malnutrition: Secondary | ICD-10-CM | POA: Diagnosis not present

## 2023-02-10 DIAGNOSIS — G20C Parkinsonism, unspecified: Secondary | ICD-10-CM | POA: Diagnosis not present

## 2023-02-10 DIAGNOSIS — F0671 Mild neurocognitive disorder due to known physiological condition with behavioral disturbance: Secondary | ICD-10-CM | POA: Diagnosis not present

## 2023-02-10 DIAGNOSIS — Z515 Encounter for palliative care: Secondary | ICD-10-CM | POA: Diagnosis not present

## 2023-02-26 ENCOUNTER — Ambulatory Visit (INDEPENDENT_AMBULATORY_CARE_PROVIDER_SITE_OTHER): Payer: Medicare Other | Admitting: Family Medicine

## 2023-02-26 ENCOUNTER — Encounter: Payer: Self-pay | Admitting: Family Medicine

## 2023-02-26 VITALS — BP 118/72 | HR 76 | Temp 97.3°F | Ht 68.0 in | Wt 130.6 lb

## 2023-02-26 DIAGNOSIS — Z131 Encounter for screening for diabetes mellitus: Secondary | ICD-10-CM

## 2023-02-26 DIAGNOSIS — G20A1 Parkinson's disease without dyskinesia, without mention of fluctuations: Secondary | ICD-10-CM

## 2023-02-26 DIAGNOSIS — F324 Major depressive disorder, single episode, in partial remission: Secondary | ICD-10-CM

## 2023-02-26 DIAGNOSIS — I495 Sick sinus syndrome: Secondary | ICD-10-CM

## 2023-02-26 MED ORDER — MIRTAZAPINE 15 MG PO TABS: 15.0000 mg | ORAL_TABLET | Freq: Every day | ORAL | 2 refills | Status: DC

## 2023-02-26 NOTE — Progress Notes (Signed)
Remote pacemaker transmission.   

## 2023-02-26 NOTE — Progress Notes (Signed)
Provider:  Jacalyn Lefevre, MD  Careteam: Patient Care Team: Frederica Kuster, MD as PCP - General (Family Medicine) Croitoru, [name], MD as PCP - Cardiology (Cardiology)  PLACE OF SERVICE:  [facility]  Advanced Directive information Does Patient Have a Medical Advance Directive?: Yes, Type of Advance Directive: Healthcare Power of [place];Living will, Does patient want to make changes to medical advance directive?: No - Patient declined  Allergies  Allergen Reactions   Propofol Anaphylaxis    Chief Complaint  Patient presents with   Medical Management of Chronic Issues    6 month follow-up and referral request for home health for wound care. Patient is here with spouse and is concerned about weight gain, ? If related to fluid retention.      HPI: Patient is a 82 y.o. male history of Parkinson's.  Followed by neurologist at [facility].  Has gained some 10 pounds since last visit on Remeron 15 mg.  Also takes Sinemet IR 25 100 3 times daily as well as Lexapro 10 mg at bedtime. There have been no falls.  Gait remains stable.  No dysphagia.  He is being covered for depression with Lexapro.  Sees a neurologist at [facility] for [employer].  Review of Systems:  Review of Systems  Constitutional: Negative.   HENT: Negative.    Respiratory: Negative.    Cardiovascular: Negative.   Gastrointestinal: Negative.   Genitourinary: Negative.   Musculoskeletal: Negative.   Skin: Negative.   Neurological:  Positive for tremors.  Psychiatric/Behavioral:  Positive for depression.   All other systems reviewed and are negative.   Past Medical History:  Diagnosis Date   AC joint separation    Right shoulder   Acute pericarditis 08/09/2018   Admitted 11/19 for pericarditis; ? Related to recent pacer implant; small eff on echo/no tamponade   Anxiety    Aortic regurgitation    Complication of anesthesia    Dementia, Lewy body with behavior disturbance (HCC)    Depression     Diastolic dysfunction    Hypertension    Left leg pain    Microcytic anemia    Presence of permanent cardiac pacemaker 08/05/2018   St Jude Assurity Dual lead PPM   SSS (sick sinus syndrome) (HCC)    Past Surgical History:  Procedure Laterality Date   acromioclavicular separation  07/04/2016   CHEST TUBE INSERTION Left 08/05/2018   EYE SURGERY     HERNIA REPAIR     LEAD REVISION/REPAIR N/A 08/10/2018   Procedure: LEAD REVISION/REPAIR;  Surgeon: Duke Salvia, MD;  Location: [facility];  Service: Cardiovascular;  Laterality: N/A;   NM MYOVIEW LTD     PACEMAKER IMPLANT N/A 08/05/2018   Procedure: St Jude dual lead PACEMAKER IMPLANT;  Surgeon: Thurmon Fair, MD;  Location: [facility];  Service: Cardiovascular;  Laterality: N/A;   Right ingunial hernia repair     US ECHOCARDIOGRAPHY     Social History:   reports that he has never smoked. He has never used smokeless tobacco. He reports that he does not drink alcohol and does not use drugs.  Family History  Problem Relation Age of Onset   Hypertension Mother    Lung cancer Mother    Diabetes Mother    Thyroid disease Mother    Angina Father    Heart attack Father    Heart disease Brother    Diabetes Brother    Heart attack Brother    Asthma Maternal Grandmother  Stroke Maternal Grandfather    Diabetes Paternal Grandfather    Heart Problems Brother    Dementia Neg Hx     Medications: Patient's Medications  New Prescriptions   No medications on file  Previous Medications   ACETAMINOPHEN (TYLENOL) 325 MG TABLET    Take 650 mg by mouth every 6 (six) hours as needed for mild pain.   ASPIRIN EC 81 MG TABLET    Take 81 mg by mouth daily.   CARBIDOPA-LEVODOPA (SINEMET IR) 25-100 MG TABLET    Take 1 tablet by mouth 3 (three) times daily. (0830, 1330, & 2030)   ESCITALOPRAM (LEXAPRO) 10 MG TABLET    TAKE 1 TABLET BY MOUTH AT BEDTIME   L-METHYLFOLATE-B12-B6-B2 (CEREFOLIN) 02-28-49-5 MG TABS    Take 1 tablet by  mouth daily.   MELATONIN-PYRIDOXINE (MELATIN PO)    Take 3 mg by mouth at bedtime.   MIRTAZAPINE (REMERON) 15 MG TABLET    Take 15 mg by mouth at bedtime.   MOMETASONE (ELOCON) 0.1 % CREAM    Apply 1 application topically daily. To dry red scaly areas on face   MULTIPLE VITAMIN (MULTIVITAMIN WITH MINERALS) TABS TABLET    Take 1 tablet by mouth daily.  Modified Medications   No medications on file  Discontinued Medications   DONEPEZIL (ARICEPT) 10 MG TABLET    Take 5 mg by mouth daily.   GUAIFENESIN-DEXTROMETHORPHAN (ROBITUSSIN DM) 100-10 MG/5ML SYRUP    Take 5 mLs by mouth every 8 (eight) hours as needed for cough.   LORATADINE (CLARITIN) 10 MG TABLET    Take 1 tablet (10 mg total) by mouth daily.    Physical Exam:  Vitals:   02/26/23 1254  BP: 118/72  Pulse: 76  Temp: (!) 97.3 F (36.3 C)  TempSrc: Temporal  SpO2: 98%  Weight: 130 lb 9.6 oz (59.2 kg)  Height: 5\' 8"  (1.727 m)   Body mass index is 19.86 kg/m. Wt Readings from Last 3 Encounters:  02/26/23 130 lb 9.6 oz (59.2 kg)  09/24/22 120 lb 3.2 oz (54.5 kg)  07/03/22 125 lb 12.8 oz (57.1 kg)    Physical Exam Vitals and nursing note reviewed.  Constitutional:      Appearance: Normal appearance.  Cardiovascular:     Rate and Rhythm: Normal rate and regular rhythm.  Pulmonary:     Effort: Pulmonary effort is normal.     Breath sounds: Normal breath sounds.  Musculoskeletal:        General: Normal range of motion.  Neurological:     General: No focal deficit present.     Mental Status: He is alert and oriented to person, place, and time.     Gait: Gait normal.     Comments: No rigidity or cogwheeling Fine motor movements are grossly normal There is a masklike facies present  Psychiatric:        Mood and Affect: Mood normal.     Labs reviewed: Basic Metabolic Panel: No results for input(s): "NA", "K", "CL", "CO2", "GLUCOSE", "BUN", "CREATININE", "CALCIUM", "MG", "PHOS", "TSH" in the last 8760 hours. Liver  Function Tests: No results for input(s): "AST", "ALT", "ALKPHOS", "BILITOT", "PROT", "ALBUMIN" in the last 8760 hours. No results for input(s): "LIPASE", "AMYLASE" in the last 8760 hours. No results for input(s): "AMMONIA" in the last 8760 hours. CBC: No results for input(s): "WBC", "NEUTROABS", "HGB", "HCT", "MCV", "PLT" in the last 8760 hours. Lipid Panel: No results for input(s): "CHOL", "HDL", "LDLCALC", "TRIG", "CHOLHDL", "LDLDIRECT" in the last  8760 hours. TSH: No results for input(s): "TSH" in the last 8760 hours. A1C: No results found for: "HGBA1C"   Assessment/Plan  1. Screening for diabetes mellitus Wife concerned about possible diabetes will screen with A1c  2. Depression, major, single episode, in partial remission (HCC) Depression frequent issue with Parkinson's patient was started on Lexapro  3. 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  What was previously called dementia I think is probably related to the Parkinson's and has not changed  4. Sick sinus syndrome [facility]) Pacemaker is checked periodically.  No issues there.   Jacalyn Lefevre, MD [facility] [phone]

## 2023-02-27 LAB — HEMOGLOBIN A1C
Hgb A1c MFr Bld: 5.7 % of total Hgb — ABNORMAL HIGH (ref <5.7)
Mean Plasma Glucose: 117 mg/dL
eAG (mmol/L): 6.5 mmol/L

## 2023-03-01 DIAGNOSIS — F32A Depression, unspecified: Secondary | ICD-10-CM | POA: Diagnosis not present

## 2023-03-01 DIAGNOSIS — I495 Sick sinus syndrome: Secondary | ICD-10-CM | POA: Diagnosis not present

## 2023-03-01 DIAGNOSIS — I119 Hypertensive heart disease without heart failure: Secondary | ICD-10-CM | POA: Diagnosis not present

## 2023-03-01 DIAGNOSIS — G3183 Dementia with Lewy bodies: Secondary | ICD-10-CM | POA: Diagnosis not present

## 2023-03-01 DIAGNOSIS — Z7982 Long term (current) use of aspirin: Secondary | ICD-10-CM | POA: Diagnosis not present

## 2023-03-01 DIAGNOSIS — F0284 Dementia in other diseases classified elsewhere, unspecified severity, with anxiety: Secondary | ICD-10-CM | POA: Diagnosis not present

## 2023-03-01 DIAGNOSIS — F0283 Dementia in other diseases classified elsewhere, unspecified severity, with mood disturbance: Secondary | ICD-10-CM | POA: Diagnosis not present

## 2023-03-01 DIAGNOSIS — I351 Nonrheumatic aortic (valve) insufficiency: Secondary | ICD-10-CM | POA: Diagnosis not present

## 2023-03-05 ENCOUNTER — Telehealth: Payer: Self-pay

## 2023-03-05 DIAGNOSIS — F0283 Dementia in other diseases classified elsewhere, unspecified severity, with mood disturbance: Secondary | ICD-10-CM | POA: Diagnosis not present

## 2023-03-05 DIAGNOSIS — I119 Hypertensive heart disease without heart failure: Secondary | ICD-10-CM | POA: Diagnosis not present

## 2023-03-05 DIAGNOSIS — F32A Depression, unspecified: Secondary | ICD-10-CM | POA: Diagnosis not present

## 2023-03-05 DIAGNOSIS — I351 Nonrheumatic aortic (valve) insufficiency: Secondary | ICD-10-CM | POA: Diagnosis not present

## 2023-03-05 DIAGNOSIS — G3183 Dementia with Lewy bodies: Secondary | ICD-10-CM | POA: Diagnosis not present

## 2023-03-05 DIAGNOSIS — F0284 Dementia in other diseases classified elsewhere, unspecified severity, with anxiety: Secondary | ICD-10-CM | POA: Diagnosis not present

## 2023-03-05 NOTE — Telephone Encounter (Signed)
Nida Boatman, PT with [facility] called requesting verbal orders for PT 2 times a week for 3 weeks starting this week for balance, gait training and home exerciseprogram.  Verbal order was given.  Message sent to Dr. Jacalyn Lefevre [facility])

## 2023-03-07 DIAGNOSIS — G3183 Dementia with Lewy bodies: Secondary | ICD-10-CM | POA: Diagnosis not present

## 2023-03-07 DIAGNOSIS — I351 Nonrheumatic aortic (valve) insufficiency: Secondary | ICD-10-CM | POA: Diagnosis not present

## 2023-03-07 DIAGNOSIS — F0284 Dementia in other diseases classified elsewhere, unspecified severity, with anxiety: Secondary | ICD-10-CM | POA: Diagnosis not present

## 2023-03-07 DIAGNOSIS — F32A Depression, unspecified: Secondary | ICD-10-CM | POA: Diagnosis not present

## 2023-03-07 DIAGNOSIS — I119 Hypertensive heart disease without heart failure: Secondary | ICD-10-CM | POA: Diagnosis not present

## 2023-03-07 DIAGNOSIS — F0283 Dementia in other diseases classified elsewhere, unspecified severity, with mood disturbance: Secondary | ICD-10-CM | POA: Diagnosis not present

## 2023-03-11 DIAGNOSIS — F0283 Dementia in other diseases classified elsewhere, unspecified severity, with mood disturbance: Secondary | ICD-10-CM | POA: Diagnosis not present

## 2023-03-11 DIAGNOSIS — I351 Nonrheumatic aortic (valve) insufficiency: Secondary | ICD-10-CM | POA: Diagnosis not present

## 2023-03-11 DIAGNOSIS — G3183 Dementia with Lewy bodies: Secondary | ICD-10-CM | POA: Diagnosis not present

## 2023-03-11 DIAGNOSIS — F0284 Dementia in other diseases classified elsewhere, unspecified severity, with anxiety: Secondary | ICD-10-CM | POA: Diagnosis not present

## 2023-03-11 DIAGNOSIS — F32A Depression, unspecified: Secondary | ICD-10-CM | POA: Diagnosis not present

## 2023-03-11 DIAGNOSIS — I119 Hypertensive heart disease without heart failure: Secondary | ICD-10-CM | POA: Diagnosis not present

## 2023-03-13 DIAGNOSIS — I119 Hypertensive heart disease without heart failure: Secondary | ICD-10-CM | POA: Diagnosis not present

## 2023-03-13 DIAGNOSIS — G3183 Dementia with Lewy bodies: Secondary | ICD-10-CM | POA: Diagnosis not present

## 2023-03-13 DIAGNOSIS — F0284 Dementia in other diseases classified elsewhere, unspecified severity, with anxiety: Secondary | ICD-10-CM | POA: Diagnosis not present

## 2023-03-13 DIAGNOSIS — F0283 Dementia in other diseases classified elsewhere, unspecified severity, with mood disturbance: Secondary | ICD-10-CM | POA: Diagnosis not present

## 2023-03-13 DIAGNOSIS — F32A Depression, unspecified: Secondary | ICD-10-CM | POA: Diagnosis not present

## 2023-03-13 DIAGNOSIS — I351 Nonrheumatic aortic (valve) insufficiency: Secondary | ICD-10-CM | POA: Diagnosis not present

## 2023-03-17 DIAGNOSIS — I119 Hypertensive heart disease without heart failure: Secondary | ICD-10-CM | POA: Diagnosis not present

## 2023-03-17 DIAGNOSIS — G3183 Dementia with Lewy bodies: Secondary | ICD-10-CM | POA: Diagnosis not present

## 2023-03-17 DIAGNOSIS — F0284 Dementia in other diseases classified elsewhere, unspecified severity, with anxiety: Secondary | ICD-10-CM | POA: Diagnosis not present

## 2023-03-17 DIAGNOSIS — I351 Nonrheumatic aortic (valve) insufficiency: Secondary | ICD-10-CM | POA: Diagnosis not present

## 2023-03-17 DIAGNOSIS — F0283 Dementia in other diseases classified elsewhere, unspecified severity, with mood disturbance: Secondary | ICD-10-CM | POA: Diagnosis not present

## 2023-03-17 DIAGNOSIS — F32A Depression, unspecified: Secondary | ICD-10-CM | POA: Diagnosis not present

## 2023-03-19 DIAGNOSIS — F0283 Dementia in other diseases classified elsewhere, unspecified severity, with mood disturbance: Secondary | ICD-10-CM | POA: Diagnosis not present

## 2023-03-19 DIAGNOSIS — I351 Nonrheumatic aortic (valve) insufficiency: Secondary | ICD-10-CM | POA: Diagnosis not present

## 2023-03-19 DIAGNOSIS — F0284 Dementia in other diseases classified elsewhere, unspecified severity, with anxiety: Secondary | ICD-10-CM | POA: Diagnosis not present

## 2023-03-19 DIAGNOSIS — F32A Depression, unspecified: Secondary | ICD-10-CM | POA: Diagnosis not present

## 2023-03-19 DIAGNOSIS — I119 Hypertensive heart disease without heart failure: Secondary | ICD-10-CM | POA: Diagnosis not present

## 2023-03-19 DIAGNOSIS — G3183 Dementia with Lewy bodies: Secondary | ICD-10-CM | POA: Diagnosis not present

## 2023-03-20 DIAGNOSIS — B351 Tinea unguium: Secondary | ICD-10-CM | POA: Diagnosis not present

## 2023-03-20 DIAGNOSIS — M79676 Pain in unspecified toe(s): Secondary | ICD-10-CM | POA: Diagnosis not present

## 2023-03-20 DIAGNOSIS — I70203 Unspecified atherosclerosis of native arteries of extremities, bilateral legs: Secondary | ICD-10-CM | POA: Diagnosis not present

## 2023-03-20 DIAGNOSIS — L84 Corns and callosities: Secondary | ICD-10-CM | POA: Diagnosis not present

## 2023-03-24 DIAGNOSIS — F32A Depression, unspecified: Secondary | ICD-10-CM | POA: Diagnosis not present

## 2023-03-24 DIAGNOSIS — F0283 Dementia in other diseases classified elsewhere, unspecified severity, with mood disturbance: Secondary | ICD-10-CM | POA: Diagnosis not present

## 2023-03-24 DIAGNOSIS — I351 Nonrheumatic aortic (valve) insufficiency: Secondary | ICD-10-CM | POA: Diagnosis not present

## 2023-03-24 DIAGNOSIS — G3183 Dementia with Lewy bodies: Secondary | ICD-10-CM | POA: Diagnosis not present

## 2023-03-24 DIAGNOSIS — F0284 Dementia in other diseases classified elsewhere, unspecified severity, with anxiety: Secondary | ICD-10-CM | POA: Diagnosis not present

## 2023-03-24 DIAGNOSIS — I119 Hypertensive heart disease without heart failure: Secondary | ICD-10-CM | POA: Diagnosis not present

## 2023-03-25 DIAGNOSIS — E440 Moderate protein-calorie malnutrition: Secondary | ICD-10-CM | POA: Diagnosis not present

## 2023-03-25 DIAGNOSIS — G20C Parkinsonism, unspecified: Secondary | ICD-10-CM | POA: Diagnosis not present

## 2023-03-25 DIAGNOSIS — F0671 Mild neurocognitive disorder due to known physiological condition with behavioral disturbance: Secondary | ICD-10-CM | POA: Diagnosis not present

## 2023-03-25 DIAGNOSIS — Z515 Encounter for palliative care: Secondary | ICD-10-CM | POA: Diagnosis not present

## 2023-03-31 DIAGNOSIS — F0284 Dementia in other diseases classified elsewhere, unspecified severity, with anxiety: Secondary | ICD-10-CM | POA: Diagnosis not present

## 2023-03-31 DIAGNOSIS — I351 Nonrheumatic aortic (valve) insufficiency: Secondary | ICD-10-CM | POA: Diagnosis not present

## 2023-03-31 DIAGNOSIS — I119 Hypertensive heart disease without heart failure: Secondary | ICD-10-CM | POA: Diagnosis not present

## 2023-03-31 DIAGNOSIS — G3183 Dementia with Lewy bodies: Secondary | ICD-10-CM | POA: Diagnosis not present

## 2023-03-31 DIAGNOSIS — F0283 Dementia in other diseases classified elsewhere, unspecified severity, with mood disturbance: Secondary | ICD-10-CM | POA: Diagnosis not present

## 2023-03-31 DIAGNOSIS — Z7982 Long term (current) use of aspirin: Secondary | ICD-10-CM | POA: Diagnosis not present

## 2023-03-31 DIAGNOSIS — I495 Sick sinus syndrome: Secondary | ICD-10-CM | POA: Diagnosis not present

## 2023-03-31 DIAGNOSIS — F32A Depression, unspecified: Secondary | ICD-10-CM | POA: Diagnosis not present

## 2023-04-04 DIAGNOSIS — I119 Hypertensive heart disease without heart failure: Secondary | ICD-10-CM | POA: Diagnosis not present

## 2023-04-04 DIAGNOSIS — F0284 Dementia in other diseases classified elsewhere, unspecified severity, with anxiety: Secondary | ICD-10-CM | POA: Diagnosis not present

## 2023-04-04 DIAGNOSIS — I351 Nonrheumatic aortic (valve) insufficiency: Secondary | ICD-10-CM | POA: Diagnosis not present

## 2023-04-04 DIAGNOSIS — F32A Depression, unspecified: Secondary | ICD-10-CM | POA: Diagnosis not present

## 2023-04-04 DIAGNOSIS — F0283 Dementia in other diseases classified elsewhere, unspecified severity, with mood disturbance: Secondary | ICD-10-CM | POA: Diagnosis not present

## 2023-04-04 DIAGNOSIS — G3183 Dementia with Lewy bodies: Secondary | ICD-10-CM | POA: Diagnosis not present

## 2023-04-09 DIAGNOSIS — F32A Depression, unspecified: Secondary | ICD-10-CM | POA: Diagnosis not present

## 2023-04-09 DIAGNOSIS — I119 Hypertensive heart disease without heart failure: Secondary | ICD-10-CM | POA: Diagnosis not present

## 2023-04-09 DIAGNOSIS — G3183 Dementia with Lewy bodies: Secondary | ICD-10-CM | POA: Diagnosis not present

## 2023-04-09 DIAGNOSIS — F0283 Dementia in other diseases classified elsewhere, unspecified severity, with mood disturbance: Secondary | ICD-10-CM | POA: Diagnosis not present

## 2023-04-09 DIAGNOSIS — F0284 Dementia in other diseases classified elsewhere, unspecified severity, with anxiety: Secondary | ICD-10-CM | POA: Diagnosis not present

## 2023-04-09 DIAGNOSIS — I351 Nonrheumatic aortic (valve) insufficiency: Secondary | ICD-10-CM | POA: Diagnosis not present

## 2023-04-17 ENCOUNTER — Ambulatory Visit: Payer: Medicare Other | Attending: Cardiovascular Disease | Admitting: Cardiovascular Disease

## 2023-04-17 ENCOUNTER — Encounter: Payer: Self-pay | Admitting: Cardiovascular Disease

## 2023-04-17 VITALS — BP 155/71 | HR 60 | Ht 69.0 in | Wt 136.8 lb

## 2023-04-17 DIAGNOSIS — I4719 Other supraventricular tachycardia: Secondary | ICD-10-CM | POA: Diagnosis not present

## 2023-04-17 DIAGNOSIS — I951 Orthostatic hypotension: Secondary | ICD-10-CM

## 2023-04-17 DIAGNOSIS — I499 Cardiac arrhythmia, unspecified: Secondary | ICD-10-CM

## 2023-04-17 DIAGNOSIS — I495 Sick sinus syndrome: Secondary | ICD-10-CM

## 2023-04-17 DIAGNOSIS — G20A1 Parkinson's disease without dyskinesia, without mention of fluctuations: Secondary | ICD-10-CM

## 2023-04-17 DIAGNOSIS — I10 Essential (primary) hypertension: Secondary | ICD-10-CM | POA: Diagnosis not present

## 2023-04-17 DIAGNOSIS — I5032 Chronic diastolic (congestive) heart failure: Secondary | ICD-10-CM | POA: Diagnosis not present

## 2023-04-17 DIAGNOSIS — Z950 Presence of cardiac pacemaker: Secondary | ICD-10-CM | POA: Diagnosis not present

## 2023-04-17 DIAGNOSIS — E441 Mild protein-calorie malnutrition: Secondary | ICD-10-CM | POA: Diagnosis not present

## 2023-04-17 NOTE — Progress Notes (Signed)
Cardiology Office Note:    Date:  04/17/2023   ID:  Charles [name], DOB Oct 13, 1940, MRN [id number]  PCP:  Charles Kuster, MD  Cardiologist:  New Electrophysiologist:  None   Referring MD: Charles Kuster, MD   Chief Complaint  Patient presents with   Pacemaker Check     History of Present Illness:    Charles [name] is a 82 y.o. male with a hx of rapidly progressive dementia, hypertension, history of diastolic dysfunction and aortic insufficiency, Parkinson's and sinus node dysfunction with symptomatic bradycardia.  His wife [name] accompanies him today.  She is also my patient.  From a cardiac point of view there have been no events since his last appointment.  He was losing weight rapidly and was very inactive.  He is improved after undergoing some physical therapy.  He is gaining back a little weight.  His BMI is borderline at 20.  He has not had any shortness of breath or angina with activity and denies syncope dizziness or palpitations.  He does not have orthopnea or PND and currently does not have a lot of ankle swelling, although towards the end of the day he typically has 1+ edema.  The edema is a new development for him.  He has not had any serious falls.  Blood pressure was high today in the clinic.  After he rested for a while his systolic blood pressure came down to the 150s.  He tends to run a very low diastolic pressure and we stopped all his medications a while back for symptomatic orthostatic hypotension.  Pacemaker interrogation shows normal device function.  Estimated generator longevity is 6.1 years.  He has 89% atrial pacing with decent heart rate histograms and never requires ventricular pacing.  He has had only a couple of brief episodes of paroxysmal atrial tachycardia in the last 12 months.  He has had several episodes of pacemaker mediated tachycardia, no atrial fibrillation.  Unfortunately his PVARP is already prolonged to the maximum allowed by his device.  I  do not think I can adjust it any further to prevent the PMT.  Fortunately the episodes are brief, promptly detected and interrupted by the device algorithm and have consistently been asymptomatic.   He underwent implantation of a dual-chamber pacemaker (St Jude Assurity) in November 2019, complicated by lead perforation with pericarditis and pneumothorax.  However, he has recovered well from that procedure.  He seems to be more active and more interactive according to his family ever since the pacemaker was implanted.     Past Medical History:  Diagnosis Date   AC joint separation    Right shoulder   Acute pericarditis 08/09/2018   Admitted 11/19 for pericarditis; ? 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   SSS (sick sinus syndrome) (HCC)     Past Surgical History:  Procedure Laterality Date   acromioclavicular separation  07/04/2016   CHEST TUBE INSERTION Left 08/05/2018   EYE SURGERY     HERNIA REPAIR     LEAD REVISION/REPAIR N/A 08/10/2018   Procedure: LEAD REVISION/REPAIR;  Surgeon: Graciela Husbands,  Salvatore Decent, MD;  Location: [facility];  Service: Cardiovascular;  Laterality: N/A;   NM MYOVIEW LTD     PACEMAKER IMPLANT N/A 08/05/2018   Procedure: St Jude dual lead PACEMAKER IMPLANT;  Surgeon: Thurmon Fair, MD;  Location: [facility]AS[facility] CV [facility];  Service: Cardiovascular;  Laterality: N/A;   Right ingunial hernia repair     US ECHOCARDIOGRAPHY      Current Medications: Current Meds  Medication Sig   acetaminophen (TYLENOL) 325 MG tablet Take 650 mg by mouth every 6 (six) hours as needed for mild pain.   aspirin EC 81 MG tablet Take 81 mg by mouth daily.    carbidopa-levodopa (SINEMET IR) 25-100 MG tablet Take 1 tablet by mouth 3 (three) times daily. (0830, 1330, & 2030)   escitalopram (LEXAPRO) 10 MG tablet TAKE 1 TABLET BY MOUTH AT BEDTIME   L-Methylfolate-B12-B6-B2 (CEREFOLIN) 02-28-49-5 MG TABS Take 1 tablet by mouth daily.   Melatonin-Pyridoxine (MELATIN PO) Take 3 mg by mouth at bedtime.   mirtazapine (REMERON) 15 MG tablet Take 1 tablet (15 mg total) by mouth at bedtime.   mometasone (ELOCON) 0.1 % cream Apply 1 application topically daily. To dry red scaly areas on face   Multiple Vitamin (MULTIVITAMIN WITH MINERALS) TABS tablet Take 1 tablet by mouth daily.     Allergies:   Propofol   Social History   Socioeconomic History   Marital status: Married    Spouse name: Not on file   Number of children: 3   Years of education: Not on file   Highest education level: Associate degree: academic program  Occupational History   Not on file  Tobacco Use   Smoking status: Never   Smokeless tobacco: Never  Vaping Use   Vaping status: Never Used  Substance and Sexual Activity   Alcohol use: No   Drug use: No   Sexual activity: Not on file  Other Topics Concern   Not on file  Social History Narrative   Lives at home with his wife   Right handed   No caffeine    Social Determinants of Health   Financial Resource Strain: Not on file  Food Insecurity: Not on file  Transportation Needs: Not on file  Physical Activity: Not on file  Stress: Not on file  Social Connections: Not on file     Family History: The patient's family history includes Angina in his father; Asthma in his maternal grandmother; Diabetes in his brother, mother, and paternal grandfather; Heart Problems in his brother; Heart attack in his brother and father; Heart disease in his brother; Hypertension in his mother; Lung cancer in his mother; Stroke in his maternal grandfather; Thyroid disease in his mother. There is no history of Dementia.  ROS:   Please see the  history of present illness.   All other systems are reviewed and are negative.   EKGs/Labs/Other Studies Reviewed:     EKG:  EKG is ordered today.  Shows atrial paced, ventricular sensed rhythm. Recent Labs: No results found for requested labs within last 365 days.  Recent Lipid Panel    Component Value Date/Time   CHOL 222 (H) 02/24/2019 0902   TRIG 88 02/24/2019 0902   HDL 118 02/24/2019 0902   CHOLHDL 1.9 02/24/2019 0902   LDLCALC 86 02/24/2019 0902    Physical Exam:    VS:  BP (!) 155/71   Pulse 60   Ht 5\' 9"  (1.753 m)   Wt 136 lb 12.8 oz (62.1  kg)   SpO2 98%   BMI 20.20 kg/m     Wt Readings from Last 3 Encounters:  04/17/23 136 lb 12.8 oz (62.1 kg)  02/26/23 130 lb 9.6 oz (59.2 kg)  09/24/22 120 lb 3.2 oz (54.5 kg)     General: Alert, oriented x3, no distress, healthy left subclavian pacemaker site Head: no evidence of trauma, PERRL, EOMI, no exophtalmos or lid lag, no myxedema, no xanthelasma; normal ears, nose and oropharynx Neck: normal jugular venous pulsations and no hepatojugular reflux; he does not have exaggerated V waves; brisk carotid pulses without delay and no carotid bruits Chest: clear to auscultation, no signs of consolidation by percussion or palpation, normal fremitus, symmetrical and full respiratory excursions Cardiovascular: normal position and quality of the apical impulse, regular rhythm, normal first and second heart sounds, no diastolic murmurs, rubs or gallops.  He has a musical 1-2/6 systolic murmur heard best at the lower left sternal border, I suspect due to tricuspid regurgitation. Abdomen: no tenderness or distention, no masses by palpation, no abnormal pulsatility or arterial bruits, normal bowel sounds, no hepatosplenomegaly Extremities: no clubbing, cyanosis or edema; 2+ radial, ulnar and brachial pulses bilaterally; 2+ right femoral, posterior tibial and dorsalis pedis pulses; 2+ left femoral, posterior tibial and dorsalis pedis pulses;  no subclavian or femoral bruits Neurological: grossly nonfocal Psych: Normal mood and affect     ASSESSMENT:    1. Sick sinus syndrome (HCC)   2. Pacemaker   3. PMT (pacemaker-mediated tachycardia)   4. PAT (paroxysmal atrial tachycardia)   5. Essential hypertension   6. Orthostatic hypotension   7. Chronic diastolic CHF (congestive heart failure) (HCC)   8. Mild protein-calorie malnutrition (HCC)   9. Parkinson's disease without dyskinesia or fluctuating manifestations    PLAN:    In order of problems listed above:  Sinus bradycardia/SSS: Even with rate response turned on, his heart rate histograms remain very blunted due to sedentary lifestyle and Parkinson's disease. PPM: Normal device function.  Does not require ventricular pacing.  Continue remote downloads every 3 months. PMT: Unable to change the device settings to prevent this, but the episodes are brief, promptly terminated by the pacemaker and have not been symptomatic. PAT: Episodes are infrequent, brief and asymptomatic.  No specific treatment recommended. HTN: We had to stop his antihypertensive medications since he had symptomatic orthostatic hypotension.  This is likely due to his Parkinson's disease and the Sinemet.  Tolerate systolic blood pressure up to 161-096. CHF: He has very mild ankle swelling and no shortness of breath.  Monitor.  Try to avoid diuretics if we can due to the risk of orthostatic hypotension. Mild protein calorie malnutrition: Slowly improving.  He is no longer severely malnourished. Parkinson's disease/dementia: The progression of his dementia seem to stabilize once he was on appropriate treatment for Parkinson's disease.  The dementia is not progressing rapidly as was initially feared, but obviously he still has memory issues.    Medication Adjustments/Labs and Tests Ordered: Current medicines are reviewed at length with the patient today.  Concerns regarding medicines are outlined above.   Orders Placed This Encounter  Procedures   EKG 12-Lead   No orders of the defined types were placed in this encounter.   Patient Instructions  Medication Instructions:  No changes *If you need a refill on your cardiac medications before your next appointment, please call your pharmacy*  Follow-Up: At [facility], you and your health needs are our priority.  As part of our  continuing mission to provide you with exceptional heart care, we have created designated Provider Care Teams.  These Care Teams include your primary Cardiologist (physician) and Advanced Practice Providers (APPs -  Physician Assistants and Nurse Practitioners) who all work together to provide you with the care you need, when you need it.  We recommend signing up for the patient portal called "MyChart".  Sign up information is provided on this After Visit Summary.  MyChart is used to connect with patients for Virtual Visits (Telemedicine).  Patients are able to view lab/test results, encounter notes, upcoming appointments, etc.  Non-urgent messages can be sent to your provider as well.   To learn more about what you can do with MyChart, go to [email or web address].    Your next appointment:   1 year(s)  Provider:   Thurmon Fair, MD        Signed, Thurmon Fair, MD  04/17/2023 6:43 PM    [facility]

## 2023-04-17 NOTE — Patient Instructions (Signed)

## 2023-05-02 DIAGNOSIS — H524 Presbyopia: Secondary | ICD-10-CM | POA: Diagnosis not present

## 2023-05-02 DIAGNOSIS — H5201 Hypermetropia, right eye: Secondary | ICD-10-CM | POA: Diagnosis not present

## 2023-05-02 DIAGNOSIS — H52223 Regular astigmatism, bilateral: Secondary | ICD-10-CM | POA: Diagnosis not present

## 2023-05-02 DIAGNOSIS — H43393 Other vitreous opacities, bilateral: Secondary | ICD-10-CM | POA: Diagnosis not present

## 2023-05-02 DIAGNOSIS — H53143 Visual discomfort, bilateral: Secondary | ICD-10-CM | POA: Diagnosis not present

## 2023-05-05 DIAGNOSIS — F0671 Mild neurocognitive disorder due to known physiological condition with behavioral disturbance: Secondary | ICD-10-CM | POA: Diagnosis not present

## 2023-05-05 DIAGNOSIS — E440 Moderate protein-calorie malnutrition: Secondary | ICD-10-CM | POA: Diagnosis not present

## 2023-05-05 DIAGNOSIS — Z515 Encounter for palliative care: Secondary | ICD-10-CM | POA: Diagnosis not present

## 2023-05-05 DIAGNOSIS — G20C Parkinsonism, unspecified: Secondary | ICD-10-CM | POA: Diagnosis not present

## 2023-05-06 ENCOUNTER — Ambulatory Visit (INDEPENDENT_AMBULATORY_CARE_PROVIDER_SITE_OTHER): Payer: Medicare Other

## 2023-05-06 DIAGNOSIS — I495 Sick sinus syndrome: Secondary | ICD-10-CM | POA: Diagnosis not present

## 2023-05-06 LAB — CUP PACEART REMOTE DEVICE CHECK
Battery Remaining Longevity: 74 mo
Battery Remaining Percentage: 59 %
Battery Voltage: 2.99 V
Brady Statistic AP VP Percent: 1 %
Brady Statistic AP VS Percent: 83 %
Brady Statistic AS VP Percent: 1 %
Brady Statistic AS VS Percent: 17 %
Brady Statistic RA Percent Paced: 82 %
Brady Statistic RV Percent Paced: 1 %
Date Time Interrogation Session: 20240806020013
Implantable Lead Connection Status: 753985
Implantable Lead Connection Status: 753985
Implantable Lead Implant Date: 20191106
Implantable Lead Implant Date: 20191111
Implantable Lead Location: 753859
Implantable Lead Location: 753860
Implantable Lead Model: 1948
Implantable Pulse Generator Implant Date: 20191106
Lead Channel Impedance Value: 400 Ohm
Lead Channel Impedance Value: 580 Ohm
Lead Channel Pacing Threshold Amplitude: 0.5 V
Lead Channel Pacing Threshold Amplitude: 0.875 V
Lead Channel Pacing Threshold Pulse Width: 0.5 ms
Lead Channel Pacing Threshold Pulse Width: 0.5 ms
Lead Channel Sensing Intrinsic Amplitude: 1.5 mV
Lead Channel Sensing Intrinsic Amplitude: 3.2 mV
Lead Channel Setting Pacing Amplitude: 1.125
Lead Channel Setting Pacing Amplitude: 1.5 V
Lead Channel Setting Pacing Pulse Width: 0.5 ms
Lead Channel Setting Sensing Sensitivity: 0.5 mV
Pulse Gen Model: 2272
Pulse Gen Serial Number: 9083517

## 2023-05-22 NOTE — Progress Notes (Signed)
Remote pacemaker transmission.   

## 2023-06-01 HISTORY — PX: REPLACEMENT TOTAL HIP W/  RESURFACING IMPLANTS: SUR1222

## 2023-06-03 ENCOUNTER — Encounter: Payer: Self-pay | Admitting: Sports Medicine

## 2023-06-03 ENCOUNTER — Ambulatory Visit (INDEPENDENT_AMBULATORY_CARE_PROVIDER_SITE_OTHER): Payer: Medicare Other | Admitting: Sports Medicine

## 2023-06-03 ENCOUNTER — Ambulatory Visit (HOSPITAL_COMMUNITY)
Admission: RE | Admit: 2023-06-03 | Discharge: 2023-06-03 | Disposition: A | Discharge status: Home / Self Care | Payer: Medicare Other | Source: Ambulatory Visit | Attending: Sports Medicine | Admitting: Sports Medicine

## 2023-06-03 VITALS — BP 134/78 | HR 62 | Temp 98.1°F | Resp 17 | Ht 69.0 in | Wt 141.0 lb

## 2023-06-03 DIAGNOSIS — M25551 Pain in right hip: Secondary | ICD-10-CM

## 2023-06-03 DIAGNOSIS — I700 Atherosclerosis of aorta: Secondary | ICD-10-CM | POA: Diagnosis not present

## 2023-06-03 DIAGNOSIS — M4317 Spondylolisthesis, lumbosacral region: Secondary | ICD-10-CM | POA: Diagnosis not present

## 2023-06-03 DIAGNOSIS — M7989 Other specified soft tissue disorders: Secondary | ICD-10-CM | POA: Diagnosis not present

## 2023-06-03 DIAGNOSIS — M545 Low back pain, unspecified: Secondary | ICD-10-CM

## 2023-06-03 DIAGNOSIS — M47816 Spondylosis without myelopathy or radiculopathy, lumbar region: Secondary | ICD-10-CM | POA: Diagnosis not present

## 2023-06-03 DIAGNOSIS — M160 Bilateral primary osteoarthritis of hip: Secondary | ICD-10-CM | POA: Diagnosis not present

## 2023-06-03 MED ORDER — IBUPROFEN 400 MG PO TABS: 400.0000 mg | ORAL_TABLET | Freq: Three times a day (TID) | ORAL | 0 refills | Status: DC | PRN

## 2023-06-03 NOTE — Progress Notes (Signed)
Careteam: Patient Care Team: [name], MD as PCP - General (Internal Medicine) Croitoru, [name], MD as PCP - Cardiology (Cardiology)  PLACE OF SERVICE:  [facility]  Advanced Directive information Does Patient Have a Medical Advance Directive?: Yes, Type of Advance Directive: Healthcare Power of [place];Living will;Out of facility DNR (pink MOST or yellow form), Does patient want to make changes to medical advance directive?: No - Patient declined  Allergies  Allergen Reactions   Propofol Anaphylaxis    Chief Complaint  Patient presents with   Acute Visit    Discuss pain while walking   Immunizations    Shingrix vaccine, Flu vaccine, Covid vaccine   Health Maintenance    AWV.     HPI: Patient is a 82 y.o. male is here for  acute visit for low back pain  Accompanied by his wife , daughter and care taker Pt c/o pain in his Rt lower back and RT hip pain since Friday  Describes as sharp pain, states he cannot lift his leg due to pain  No recent  trauma Until Friday he was able to ambulate with a cane and since Friday he is not able to bear weight and using wheel chair He took tylenol for a day and noticed no difference in his pain levels.   Lower extremity swelling since Friday  Denies chest pain, palpitations, SOB, dizzy or lightheaded.  Review of Systems:  Review of Systems  Constitutional:  Negative for chills and fever.  HENT:  Negative for congestion and sore throat.   Eyes:  Negative for double vision.  Respiratory:  Negative for cough, sputum production and shortness of breath.   Cardiovascular:  Negative for chest pain, palpitations and leg swelling.  Gastrointestinal:  Negative for abdominal pain, heartburn and nausea.  Genitourinary:  Negative for dysuria, frequency and hematuria.  Musculoskeletal:  Positive for back pain and joint pain. Negative for falls and myalgias.  Neurological:  Negative for dizziness, sensory change and focal weakness.     Past Medical History:  Diagnosis Date   AC joint separation    Right shoulder   Acute pericarditis 08/09/2018   Admitted 11/19 for pericarditis; ? 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   SSS (sick sinus syndrome) (HCC)    Past Surgical History:  Procedure Laterality Date   acromioclavicular separation  07/04/2016   CHEST TUBE INSERTION Left 08/05/2018   EYE SURGERY     HERNIA REPAIR     LEAD REVISION/REPAIR N/A 08/10/2018   Procedure: LEAD REVISION/REPAIR;  Surgeon: Duke Salvia, MD;  Location: [facility] INVASIVE CV LAB;  Service: Cardiovascular;  Laterality: N/A;   NM MYOVIEW LTD     PACEMAKER IMPLANT N/A 08/05/2018   Procedure: St Jude dual lead PACEMAKER IMPLANT;  Surgeon: Thurmon Fair, MD;  Location: [facility];  Service: Cardiovascular;  Laterality: N/A;   Right ingunial hernia repair     US ECHOCARDIOGRAPHY     Social History:   reports that he has never smoked. He has never used smokeless tobacco. He reports that he does not drink alcohol and  does not use drugs.  Family History  Problem Relation Age of Onset   Hypertension Mother    Lung cancer Mother    Diabetes Mother    Thyroid disease Mother    Angina Father    Heart attack Father    Heart disease Brother    Diabetes Brother    Heart attack Brother    Asthma Maternal Grandmother    Stroke Maternal Grandfather    Diabetes Paternal Grandfather    Heart Problems Brother    Dementia Neg Hx     Medications: Patient's Medications  New Prescriptions   IBUPROFEN (IBU) 400 MG TABLET    Take 1 tablet (400 mg total) by mouth every 8 (eight) hours as needed.  Previous Medications    ACETAMINOPHEN (TYLENOL) 325 MG TABLET    Take 650 mg by mouth every 6 (six) hours as needed for mild pain.   ASPIRIN EC 81 MG TABLET    Take 81 mg by mouth daily.   CARBIDOPA-LEVODOPA (SINEMET IR) 25-100 MG TABLET    Take 1 tablet by mouth 3 (three) times daily. (0830, 1330, & 2030)   ESCITALOPRAM (LEXAPRO) 10 MG TABLET    TAKE 1 TABLET BY MOUTH AT BEDTIME   L-METHYLFOLATE-B12-B6-B2 (CEREFOLIN) 02-28-49-5 MG TABS    Take 1 tablet by mouth daily.   MELATONIN-PYRIDOXINE (MELATIN PO)    Take 3 mg by mouth at bedtime.   MIRTAZAPINE (REMERON) 15 MG TABLET    Take 1 tablet (15 mg total) by mouth at bedtime.   MOMETASONE (ELOCON) 0.1 % CREAM    Apply 1 application topically daily. To dry red scaly areas on face   MULTIPLE VITAMIN (MULTIVITAMIN WITH MINERALS) TABS TABLET    Take 1 tablet by mouth daily.  Modified Medications   No medications on file  Discontinued Medications   No medications on file    Physical Exam:  Vitals:   06/03/23 1359  BP: 134/78  Pulse: 62  Resp: 17  Temp: 98.1 F (36.7 C)  TempSrc: Temporal  SpO2: 94%  Weight: 141 lb (64 kg)  Height: 5\' 9"  (1.753 m)   Body mass index is 20.82 kg/m. Wt Readings from Last 3 Encounters:  06/03/23 141 lb (64 kg)  04/17/23 136 lb 12.8 oz (62.1 kg)  02/26/23 130 lb 9.6 oz (59.2 kg)    Physical Exam Constitutional:      Appearance: Normal appearance.  Cardiovascular:     Rate and Rhythm: Normal rate.     Pulses: Normal pulses.     Heart sounds: Normal heart sounds.  Pulmonary:     Effort: Pulmonary effort is normal. No respiratory distress.     Breath sounds: No wheezing or rales.  Abdominal:     General: Bowel sounds are normal.  Musculoskeletal:     Comments: No spinal tenderness Pt in wheel chair Unable to stand up due to pain  Pt c/o pain with slight movement  Neurological:     Mental Status: He is alert.     Labs reviewed: Basic Metabolic Panel: No results for input(s): "NA", "K", "CL", "CO2", "GLUCOSE",  "BUN", "CREATININE", "CALCIUM", "MG", "PHOS", "TSH" in the last 8760 hours. Liver Function Tests: No results for input(s): "AST", "ALT", "ALKPHOS", "BILITOT", "PROT", "ALBUMIN" in the last 8760 hours. No results for input(s): "LIPASE", "AMYLASE" in the last 8760 hours. No results for input(s): "AMMONIA" in the last 8760 hours. CBC: No results for input(s): "WBC", "NEUTROABS", "HGB", "HCT", "MCV", "PLT" in the last 8760 hours.  Lipid Panel: No results for input(s): "CHOL", "HDL", "LDLCALC", "TRIG", "CHOLHDL", "LDLDIRECT" in the last 8760 hours. TSH: No results for input(s): "TSH" in the last 8760 hours. A1C: Lab Results  Component Value Date   HGBA1C 5.7 (H) 02/26/2023     Assessment/Plan 1. Acute bilateral low back pain without sciatica Unable to move his leg and bear weight  Using wheel chair  Reports no recent falls Will get x ray  Daughter requesting for an aid as the patient is requiring help with all his ADLS due to pain  Pt took tylenol with no improvement in pain  Will avoid narcotics  Will send ibu   - Ambulatory referral to Home Health - DG Lumbar Spine Complete; Future - DG Sacrum/Coccyx; Future  2. Pain of right hip Will get x ray hip Instructed to take ibu  Use lidocaine patch  - Ambulatory referral to Home Health - DG HIP UNILAT WITH PELVIS 2-3 VIEWS RIGHT; Future  3. Swelling of lower extremity 1+ pitting oedema Lungs clear  Use compression stockings  Other orders - ibuprofen (IBU) 400 MG tablet; Take 1 tablet (400 mg total) by mouth every 8 (eight) hours as needed.  Dispense: 30 tablet; Refill: 0   No follow-ups on file.: follow up in a week   I spent greater than 30  minutes for the care of this patient in face to face time, chart review, clinical documentation, patient education.

## 2023-06-09 ENCOUNTER — Ambulatory Visit (INDEPENDENT_AMBULATORY_CARE_PROVIDER_SITE_OTHER): Payer: Medicare Other | Admitting: Family

## 2023-06-09 ENCOUNTER — Encounter: Payer: Self-pay | Admitting: Family

## 2023-06-09 VITALS — BP 128/78 | HR 64 | Temp 98.4°F | Resp 18 | Ht 69.0 in | Wt 141.0 lb

## 2023-06-09 DIAGNOSIS — M545 Low back pain, unspecified: Secondary | ICD-10-CM | POA: Diagnosis not present

## 2023-06-09 DIAGNOSIS — M7989 Other specified soft tissue disorders: Secondary | ICD-10-CM | POA: Diagnosis not present

## 2023-06-09 DIAGNOSIS — M25551 Pain in right hip: Secondary | ICD-10-CM | POA: Diagnosis not present

## 2023-06-09 MED ORDER — GABAPENTIN 100 MG PO CAPS
100.0000 mg | ORAL_CAPSULE | Freq: Two times a day (BID) | ORAL | 3 refills | Status: DC | PRN
Start: 2023-06-09 — End: 2023-08-12

## 2023-06-09 NOTE — Progress Notes (Signed)
Provider: Briseyda Fehr FNP-C  [name], MD  Patient Care Team: [name], MD as PCP - General (Internal Medicine) Thurmon Fair, MD as PCP - Cardiology (Cardiology)  Extended Emergency Contact Information Primary Emergency Contact: Inge Rise Address: [address]          [place], [place] [address] Darden Amber of [place] Home Phone: [phone] Mobile Phone: [phone] Relation: Spouse Secondary Emergency Contact: Slusher,Laura Address: [address]          [place], [place] [address] Darden Amber of [place] Home Phone: [phone] Mobile Phone: [phone] Relation: Daughter  Code Status:  Full Code  Goals of care: Advanced Directive information    06/03/2023    2:00 PM  Advanced Directives  Does Patient Have a Medical Advance Directive? Yes  Type of Estate agent of [place];Living will;Out of facility DNR (pink MOST or yellow form)  Does patient want to make changes to medical advance directive? No - Patient declined  Copy of Healthcare Power of Attorney in Chart? No - copy requested     Chief Complaint  Patient presents with   Acute Visit    Right hip and leg pain worse with no relief with ibuprofen or Salonpas patches.     HPI:  Pt is a 82 y.o. male seen today for an acute visit for evaluation of lower back pain and right hip pain which has worsen since last seen by PCP Dr.Veludandi on 06/03/2023. He was able to walk with a cane but since previous Friday was not able to bear weight and has been using a wheelchair which belonged to the daughter. Has required assistance with ADL's.Lumbar X-ray was ordered,referred to [facility] Therapy,lidocaine and Ibuprofen ordered.Daughter states lidocaine and ibuprofen have been ineffective.request another pain medication to help with his pain.He denies any injuries or fall episode.   His sacrum and coccyx X-ray was negative for any acute abnormalities. Lumbar X-ray showed transitional  lumbosacral anatomy with partial lumbarization of the S 1 segment and moderate lower lumbar spondylosis but no acute findings.  Right hip showed mild degenerative changes of both hips but no acute findings.   Also has lower extremities edema.Has had no abrupt weight gain. He denies any chest tightness,chest pain,palpitation,wheezing  or shortness of breath.   Past Medical History:  Diagnosis Date   AC joint separation    Right shoulder   Acute pericarditis 08/09/2018   Admitted 11/19 for pericarditis; ? 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   SSS (sick sinus syndrome) (HCC)    Past Surgical History:  Procedure Laterality Date   acromioclavicular separation  07/04/2016   CHEST TUBE INSERTION Left 08/05/2018   EYE SURGERY     HERNIA REPAIR     LEAD REVISION/REPAIR N/A 08/10/2018   Procedure: LEAD REVISION/REPAIR;  Surgeon: Duke Salvia, MD;  Location: [facility] INVASIVE CV LAB;  Service: Cardiovascular;  Laterality: N/A;   NM MYOVIEW LTD     PACEMAKER IMPLANT N/A 08/05/2018   Procedure: St Jude dual lead PACEMAKER IMPLANT;  Surgeon: Thurmon Fair, MD;  Location: [facility] INVASIVE CV LAB;  Service:  Cardiovascular;  Laterality: N/A;   Right ingunial hernia repair     US ECHOCARDIOGRAPHY      Allergies  Allergen Reactions   Propofol Anaphylaxis    Outpatient Encounter Medications as of 06/09/2023  Medication Sig   acetaminophen (TYLENOL) 325 MG tablet Take 650 mg by mouth every 6 (six) hours as needed for mild pain.   aspirin EC 81 MG tablet Take 81 mg by mouth daily.   carbidopa-levodopa (SINEMET IR) 25-100 MG tablet Take 1 tablet by mouth 3 (three) times daily.  (0830, 1330, & 2030)   escitalopram (LEXAPRO) 10 MG tablet TAKE 1 TABLET BY MOUTH AT BEDTIME   ibuprofen (IBU) 400 MG tablet Take 1 tablet (400 mg total) by mouth every 8 (eight) hours as needed.   L-Methylfolate-B12-B6-B2 (CEREFOLIN) 02-28-49-5 MG TABS Take 1 tablet by mouth daily.   Melatonin-Pyridoxine (MELATIN PO) Take 3 mg by mouth at bedtime.   mirtazapine (REMERON) 15 MG tablet Take 1 tablet (15 mg total) by mouth at bedtime.   mometasone (ELOCON) 0.1 % cream Apply 1 application topically daily. To dry red scaly areas on face   Multiple Vitamin (MULTIVITAMIN WITH MINERALS) TABS tablet Take 1 tablet by mouth daily.   No facility-administered encounter medications on file as of 06/09/2023.    Review of Systems  Constitutional:  Negative for appetite change, chills, fatigue, fever and unexpected weight change.  HENT:  Negative for congestion, dental problem, ear discharge, ear pain, facial swelling, hearing loss, nosebleeds, postnasal drip, rhinorrhea, sinus pressure, sinus pain, sneezing, sore throat, tinnitus and trouble swallowing.   Respiratory:  Negative for cough, chest tightness, shortness of breath and wheezing.   Cardiovascular:  Negative for chest pain, palpitations and leg swelling.  Gastrointestinal:  Negative for abdominal distention, abdominal pain, constipation, diarrhea, nausea and vomiting.  Genitourinary:  Negative for difficulty urinating, dysuria, flank pain, frequency and urgency.  Musculoskeletal:  Positive for arthralgias, back pain and gait problem. Negative for joint swelling, myalgias, neck pain and neck stiffness.  Neurological:  Negative for dizziness, syncope, speech difficulty, weakness, light-headedness, numbness and headaches.    Immunization History  Administered Date(s) Administered   Fluad Quad(high Dose 65+) 05/20/2019, 07/24/2020, 07/17/2021, 07/03/2022   Influenza, High Dose Seasonal PF 07/14/2014   Influenza-Unspecified 06/27/2011, 06/26/2012,  06/21/2013, 07/14/2014, 06/14/2015, 07/17/2016, 05/31/2017, 05/31/2018   PFIZER(Purple Top)SARS-COV-2 Vaccination 11/07/2019, 11/25/2019, 09/08/2020   Pfizer Covid-19 Vaccine Bivalent Booster 105yrs & up 08/31/2021   Pneumococcal Conjugate-13 08/09/2017   Pneumococcal Polysaccharide-23 11/23/2020   Pneumococcal-Unspecified 03/22/2006   Tdap 06/20/2009, 11/07/2019   Pertinent  Health Maintenance Due  Topic Date Due   INFLUENZA VACCINE  05/01/2023      03/27/2021    2:31 PM 07/17/2021   11:02 AM 07/03/2022    1:06 PM 09/24/2022    2:22 PM 02/26/2023    1:00 PM  Fall Risk  Falls in the past year? 0 0 0 0 0  Was there an injury with Fall? 0 0 0 0 0  Fall Risk Category Calculator 0 0 0 0 0  Fall Risk Category (Retired) Low Low Low Low   (RETIRED) Patient Fall Risk Level Low fall risk Low fall risk Low fall risk Low fall risk   Patient at Risk for Falls Due to History of fall(s) History of fall(s) History of fall(s) No Fall Risks No Fall Risks  Fall risk Follow up Falls evaluation completed;Education provided;Falls prevention discussed Falls evaluation completed;Education provided;Falls prevention discussed Falls evaluation completed Falls evaluation completed Falls evaluation completed  Functional Status Survey:    Vitals:   06/09/23 1347  BP: 128/78  Pulse: 64  Resp: 18  Temp: 98.4 F (36.9 C)  SpO2: 94%  Weight: 141 lb (64 kg)  Height: 5\' 9"  (1.753 m)   Body mass index is 20.82 kg/m. Physical Exam Vitals reviewed.  Constitutional:      General: He is not in acute distress.    Appearance: Normal appearance. He is normal weight. He is not ill-appearing or diaphoretic.  HENT:     Head: Normocephalic.     Mouth/Throat:     Mouth: Mucous membranes are moist.     Pharynx: Oropharynx is clear. No oropharyngeal exudate or posterior oropharyngeal erythema.  Eyes:     General: No scleral icterus.       Right eye: No discharge.        Left eye: No discharge.      Conjunctiva/sclera: Conjunctivae normal.     Pupils: Pupils are equal, round, and reactive to light.  Neck:     Vascular: No carotid bruit.  Cardiovascular:     Rate and Rhythm: Normal rate and regular rhythm.     Pulses: Normal pulses.     Heart sounds: Normal heart sounds. No murmur heard.    No friction rub. No gallop.  Pulmonary:     Effort: Pulmonary effort is normal. No respiratory distress.     Breath sounds: Normal breath sounds. No wheezing, rhonchi or rales.  Chest:     Chest wall: No tenderness.  Abdominal:     General: Bowel sounds are normal. There is no distension.     Palpations: Abdomen is soft. There is no mass.     Tenderness: There is no abdominal tenderness. There is no right CVA tenderness, left CVA tenderness, guarding or rebound.  Musculoskeletal:        General: No swelling.     Cervical back: Normal range of motion. No rigidity or tenderness.     Lumbar back: No swelling, edema, spasms or tenderness. Normal range of motion. Negative right straight leg raise test and negative left straight leg raise test.     Right hip: Tenderness present. No crepitus. Decreased range of motion. Decreased strength.     Left hip: Normal.     Right lower leg: No tenderness. 2+ Edema present.     Left lower leg: No tenderness. 2+ Edema present.     Comments: Unable to raise right leg lifts leg with both hands.Has groin and upper thigh area pain with raise of right leg   Lymphadenopathy:     Cervical: No cervical adenopathy.  Skin:    General: Skin is warm and dry.     Coloration: Skin is not pale.     Findings: No bruising, erythema, lesion or rash.     Comments: Monofilament sensation on right foot intact.  Neurological:     Mental Status: He is alert and oriented to person, place, and time.     Cranial Nerves: No cranial nerve deficit.     Sensory: No sensory deficit.     Motor: No weakness.     Coordination: Coordination normal.     Gait: Gait abnormal.  Psychiatric:         Mood and Affect: Mood normal.        Speech: Speech normal.        Behavior: Behavior normal.     Labs reviewed: No results for input(s): "NA", "K", "CL", "CO2", "GLUCOSE", "BUN", "CREATININE", "  CALCIUM", "MG", "PHOS" in the last 8760 hours. No results for input(s): "AST", "ALT", "ALKPHOS", "BILITOT", "PROT", "ALBUMIN" in the last 8760 hours. No results for input(s): "WBC", "NEUTROABS", "HGB", "HCT", "MCV", "PLT" in the last 8760 hours. Lab Results  Component Value Date   TSH 3.58 02/24/2019   Lab Results  Component Value Date   HGBA1C 5.7 (H) 02/26/2023   Lab Results  Component Value Date   CHOL 222 (H) 02/24/2019   HDL 118 02/24/2019   LDLCALC 86 02/24/2019   TRIG 88 02/24/2019   CHOLHDL 1.9 02/24/2019    Significant Diagnostic Results in last 30 days:  DG Sacrum/Coccyx  Result Date: 06/06/2023 CLINICAL DATA:  Acute low back pain EXAM: SACRUM AND COCCYX - 2+ VIEW COMPARISON:  10/17/2017 FINDINGS: There is no evidence of fracture or other focal bone lesions. SI joints intact without diastasis. Lower lumbar spondylosis. IMPRESSION: Negative. Electronically Signed   By: Duanne Guess D.O.   On: 06/06/2023 12:54   DG Lumbar Spine Complete  Result Date: 06/06/2023 CLINICAL DATA:  Low back pain EXAM: LUMBAR SPINE - COMPLETE 4+ VIEW COMPARISON:  10/17/2017 FINDINGS: Transitional lumbosacral anatomy with partial lumbarization of the S1 segment. Grade 1 anterolisthesis of L5 on S1. Vertebral body heights are maintained without evidence of fracture. Intervertebral disc height loss at L5-S1. Moderate lower lumbar facet arthropathy. Aortic atherosclerosis. IMPRESSION: 1. Transitional lumbosacral anatomy with partial lumbarization of the S1 segment. 2. Moderate lower lumbar spondylosis. No acute findings. Electronically Signed   By: Duanne Guess D.O.   On: 06/06/2023 12:53   DG HIP UNILAT WITH PELVIS 2-3 VIEWS RIGHT  Result Date: 06/06/2023 CLINICAL DATA:  Right hip pain EXAM: DG  HIP (WITH OR WITHOUT PELVIS) 2-3V RIGHT COMPARISON:  10/17/2017 FINDINGS: There is no evidence of hip fracture or dislocation. Mild degenerative changes of both hips. Advanced atherosclerotic vascular calcifications. IMPRESSION: Mild degenerative changes of both hips. No acute findings. Electronically Signed   By: Duanne Guess D.O.   On: 06/06/2023 12:51    Assessment/Plan  1. Pain of right hip X-ray done 06/06/2023 showed mild degenerative changes no fracture or dislocation. Ibuprofen/tylenol  ineffective for pain - will start on Gabapentin.Side effects discussed.  - urgent referral to orthopedic for evaluation.[facility] called [facility] appointment given for 06/10/2023 with Dr.Xu Naiping appreciate Dr.Xu seeing him.   - gabapentin (NEURONTIN) 100 MG capsule; Take 1 capsule (100 mg total) by mouth 2 (two) times daily as needed.  Dispense: 90 capsule; Refill: 3 - Ambulatory referral to Orthopedic Surgery  2. Acute bilateral low back pain without sciatica Has had worsening lower back pain as above unable to walk  - gabapentin (NEURONTIN) 100 MG capsule; Take 1 capsule (100 mg total) by mouth 2 (two) times daily as needed.  Dispense: 90 capsule; Refill: 3 - Ambulatory referral to Orthopedic Surgery  3. Swelling of lower extremity 2+ pitting edema  - Advised to wear knee high compression stockings on in the morning and off at bedtime.  - Compression stockings  Family/ staff Communication: Reviewed plan of care with patient ,wife and daughter verbalized understanding  Labs/tests ordered: None   Next Appointment: Return if symptoms worsen or fail to improve.   Caesar Bookman, NP

## 2023-06-10 ENCOUNTER — Telehealth: Payer: Self-pay

## 2023-06-10 ENCOUNTER — Ambulatory Visit: Payer: Medicare Other | Admitting: Orthopaedic Surgery

## 2023-06-10 DIAGNOSIS — M1990 Unspecified osteoarthritis, unspecified site: Secondary | ICD-10-CM | POA: Diagnosis not present

## 2023-06-10 DIAGNOSIS — M84451A Pathological fracture, right femur, initial encounter for fracture: Secondary | ICD-10-CM | POA: Diagnosis not present

## 2023-06-10 DIAGNOSIS — S72011A Unspecified intracapsular fracture of right femur, initial encounter for closed fracture: Secondary | ICD-10-CM | POA: Diagnosis not present

## 2023-06-10 DIAGNOSIS — M50222 Other cervical disc displacement at C5-C6 level: Secondary | ICD-10-CM | POA: Diagnosis not present

## 2023-06-10 DIAGNOSIS — S72001D Fracture of unspecified part of neck of right femur, subsequent encounter for closed fracture with routine healing: Secondary | ICD-10-CM | POA: Diagnosis not present

## 2023-06-10 DIAGNOSIS — R509 Fever, unspecified: Secondary | ICD-10-CM | POA: Diagnosis not present

## 2023-06-10 DIAGNOSIS — R627 Adult failure to thrive: Secondary | ICD-10-CM | POA: Diagnosis not present

## 2023-06-10 DIAGNOSIS — M5023 Other cervical disc displacement, cervicothoracic region: Secondary | ICD-10-CM | POA: Diagnosis not present

## 2023-06-10 DIAGNOSIS — J9811 Atelectasis: Secondary | ICD-10-CM | POA: Diagnosis not present

## 2023-06-10 DIAGNOSIS — M50221 Other cervical disc displacement at C4-C5 level: Secondary | ICD-10-CM | POA: Diagnosis not present

## 2023-06-10 DIAGNOSIS — L89152 Pressure ulcer of sacral region, stage 2: Secondary | ICD-10-CM | POA: Diagnosis not present

## 2023-06-10 DIAGNOSIS — X58XXXA Exposure to other specified factors, initial encounter: Secondary | ICD-10-CM | POA: Diagnosis not present

## 2023-06-10 DIAGNOSIS — Z9981 Dependence on supplemental oxygen: Secondary | ICD-10-CM | POA: Diagnosis not present

## 2023-06-10 DIAGNOSIS — M2578 Osteophyte, vertebrae: Secondary | ICD-10-CM | POA: Diagnosis not present

## 2023-06-10 DIAGNOSIS — R0902 Hypoxemia: Secondary | ICD-10-CM | POA: Diagnosis not present

## 2023-06-10 DIAGNOSIS — R4182 Altered mental status, unspecified: Secondary | ICD-10-CM | POA: Diagnosis not present

## 2023-06-10 DIAGNOSIS — W19XXXA Unspecified fall, initial encounter: Secondary | ICD-10-CM | POA: Diagnosis not present

## 2023-06-10 DIAGNOSIS — Z471 Aftercare following joint replacement surgery: Secondary | ICD-10-CM | POA: Diagnosis not present

## 2023-06-10 DIAGNOSIS — K5730 Diverticulosis of large intestine without perforation or abscess without bleeding: Secondary | ICD-10-CM | POA: Diagnosis not present

## 2023-06-10 DIAGNOSIS — J90 Pleural effusion, not elsewhere classified: Secondary | ICD-10-CM | POA: Diagnosis not present

## 2023-06-10 DIAGNOSIS — Q631 Lobulated, fused and horseshoe kidney: Secondary | ICD-10-CM | POA: Diagnosis not present

## 2023-06-10 DIAGNOSIS — G20A1 Parkinson's disease without dyskinesia, without mention of fluctuations: Secondary | ICD-10-CM | POA: Diagnosis not present

## 2023-06-10 DIAGNOSIS — Z96641 Presence of right artificial hip joint: Secondary | ICD-10-CM | POA: Diagnosis not present

## 2023-06-10 DIAGNOSIS — J189 Pneumonia, unspecified organism: Secondary | ICD-10-CM | POA: Diagnosis not present

## 2023-06-10 DIAGNOSIS — S72002A Fracture of unspecified part of neck of left femur, initial encounter for closed fracture: Secondary | ICD-10-CM | POA: Diagnosis not present

## 2023-06-10 DIAGNOSIS — Z79899 Other long term (current) drug therapy: Secondary | ICD-10-CM | POA: Diagnosis not present

## 2023-06-10 DIAGNOSIS — R001 Bradycardia, unspecified: Secondary | ICD-10-CM | POA: Diagnosis not present

## 2023-06-10 DIAGNOSIS — M79604 Pain in right leg: Secondary | ICD-10-CM | POA: Diagnosis not present

## 2023-06-10 DIAGNOSIS — E871 Hypo-osmolality and hyponatremia: Secondary | ICD-10-CM | POA: Diagnosis not present

## 2023-06-10 DIAGNOSIS — S7291XA Unspecified fracture of right femur, initial encounter for closed fracture: Secondary | ICD-10-CM | POA: Diagnosis not present

## 2023-06-10 DIAGNOSIS — Z791 Long term (current) use of non-steroidal anti-inflammatories (NSAID): Secondary | ICD-10-CM | POA: Diagnosis not present

## 2023-06-10 DIAGNOSIS — R262 Difficulty in walking, not elsewhere classified: Secondary | ICD-10-CM | POA: Diagnosis not present

## 2023-06-10 DIAGNOSIS — I10 Essential (primary) hypertension: Secondary | ICD-10-CM | POA: Diagnosis not present

## 2023-06-10 DIAGNOSIS — M5021 Other cervical disc displacement,  high cervical region: Secondary | ICD-10-CM | POA: Diagnosis not present

## 2023-06-10 DIAGNOSIS — D62 Acute posthemorrhagic anemia: Secondary | ICD-10-CM | POA: Diagnosis not present

## 2023-06-10 DIAGNOSIS — M80051D Age-related osteoporosis with current pathological fracture, right femur, subsequent encounter for fracture with routine healing: Secondary | ICD-10-CM | POA: Diagnosis not present

## 2023-06-10 DIAGNOSIS — R918 Other nonspecific abnormal finding of lung field: Secondary | ICD-10-CM | POA: Diagnosis not present

## 2023-06-10 DIAGNOSIS — S7291XD Unspecified fracture of right femur, subsequent encounter for closed fracture with routine healing: Secondary | ICD-10-CM | POA: Diagnosis not present

## 2023-06-10 DIAGNOSIS — M549 Dorsalgia, unspecified: Secondary | ICD-10-CM | POA: Diagnosis not present

## 2023-06-10 DIAGNOSIS — Z515 Encounter for palliative care: Secondary | ICD-10-CM | POA: Diagnosis not present

## 2023-06-10 DIAGNOSIS — R54 Age-related physical debility: Secondary | ICD-10-CM | POA: Diagnosis not present

## 2023-06-10 DIAGNOSIS — X58XXXD Exposure to other specified factors, subsequent encounter: Secondary | ICD-10-CM | POA: Diagnosis not present

## 2023-06-10 DIAGNOSIS — K828 Other specified diseases of gallbladder: Secondary | ICD-10-CM | POA: Diagnosis not present

## 2023-06-10 DIAGNOSIS — S72001A Fracture of unspecified part of neck of right femur, initial encounter for closed fracture: Secondary | ICD-10-CM | POA: Diagnosis not present

## 2023-06-10 DIAGNOSIS — N400 Enlarged prostate without lower urinary tract symptoms: Secondary | ICD-10-CM | POA: Diagnosis not present

## 2023-06-10 DIAGNOSIS — R2689 Other abnormalities of gait and mobility: Secondary | ICD-10-CM | POA: Diagnosis not present

## 2023-06-10 DIAGNOSIS — Z950 Presence of cardiac pacemaker: Secondary | ICD-10-CM | POA: Diagnosis not present

## 2023-06-10 NOTE — Telephone Encounter (Signed)
FYI  Patients daughter called to give Charles [name] an update:  Patient was referred to Ortho however the appointment was canceled due to increased pain. Patient is in the hospital due to not being able to move this morning.

## 2023-06-10 NOTE — Telephone Encounter (Signed)
Noted  

## 2023-06-11 DIAGNOSIS — S72001A Fracture of unspecified part of neck of right femur, initial encounter for closed fracture: Secondary | ICD-10-CM | POA: Diagnosis not present

## 2023-06-11 DIAGNOSIS — Z471 Aftercare following joint replacement surgery: Secondary | ICD-10-CM | POA: Diagnosis not present

## 2023-06-11 DIAGNOSIS — Z96641 Presence of right artificial hip joint: Secondary | ICD-10-CM | POA: Diagnosis not present

## 2023-06-12 DIAGNOSIS — R0902 Hypoxemia: Secondary | ICD-10-CM | POA: Diagnosis not present

## 2023-06-12 DIAGNOSIS — S7291XD Unspecified fracture of right femur, subsequent encounter for closed fracture with routine healing: Secondary | ICD-10-CM | POA: Diagnosis not present

## 2023-06-12 DIAGNOSIS — Z79899 Other long term (current) drug therapy: Secondary | ICD-10-CM | POA: Diagnosis not present

## 2023-06-12 DIAGNOSIS — X58XXXD Exposure to other specified factors, subsequent encounter: Secondary | ICD-10-CM | POA: Diagnosis not present

## 2023-06-12 DIAGNOSIS — Z950 Presence of cardiac pacemaker: Secondary | ICD-10-CM | POA: Diagnosis not present

## 2023-06-12 DIAGNOSIS — R262 Difficulty in walking, not elsewhere classified: Secondary | ICD-10-CM | POA: Diagnosis not present

## 2023-06-12 DIAGNOSIS — G20A1 Parkinson's disease without dyskinesia, without mention of fluctuations: Secondary | ICD-10-CM | POA: Diagnosis not present

## 2023-06-14 DIAGNOSIS — R509 Fever, unspecified: Secondary | ICD-10-CM | POA: Diagnosis not present

## 2023-06-14 DIAGNOSIS — R918 Other nonspecific abnormal finding of lung field: Secondary | ICD-10-CM | POA: Diagnosis not present

## 2023-06-15 DIAGNOSIS — K828 Other specified diseases of gallbladder: Secondary | ICD-10-CM | POA: Diagnosis not present

## 2023-06-15 DIAGNOSIS — R918 Other nonspecific abnormal finding of lung field: Secondary | ICD-10-CM | POA: Diagnosis not present

## 2023-06-15 DIAGNOSIS — J90 Pleural effusion, not elsewhere classified: Secondary | ICD-10-CM | POA: Diagnosis not present

## 2023-06-15 DIAGNOSIS — J9811 Atelectasis: Secondary | ICD-10-CM | POA: Diagnosis not present

## 2023-06-17 ENCOUNTER — Telehealth: Payer: Self-pay | Admitting: Cardiovascular Disease

## 2023-06-17 DIAGNOSIS — G20C Parkinsonism, unspecified: Secondary | ICD-10-CM | POA: Diagnosis not present

## 2023-06-17 DIAGNOSIS — M1990 Unspecified osteoarthritis, unspecified site: Secondary | ICD-10-CM | POA: Diagnosis not present

## 2023-06-17 DIAGNOSIS — Z515 Encounter for palliative care: Secondary | ICD-10-CM | POA: Diagnosis not present

## 2023-06-17 DIAGNOSIS — N400 Enlarged prostate without lower urinary tract symptoms: Secondary | ICD-10-CM | POA: Diagnosis not present

## 2023-06-17 DIAGNOSIS — G20A1 Parkinson's disease without dyskinesia, without mention of fluctuations: Secondary | ICD-10-CM | POA: Diagnosis not present

## 2023-06-17 DIAGNOSIS — Z66 Do not resuscitate: Secondary | ICD-10-CM | POA: Diagnosis not present

## 2023-06-17 DIAGNOSIS — R627 Adult failure to thrive: Secondary | ICD-10-CM | POA: Diagnosis not present

## 2023-06-17 DIAGNOSIS — R0902 Hypoxemia: Secondary | ICD-10-CM | POA: Diagnosis not present

## 2023-06-17 DIAGNOSIS — R001 Bradycardia, unspecified: Secondary | ICD-10-CM | POA: Diagnosis not present

## 2023-06-17 DIAGNOSIS — Z79899 Other long term (current) drug therapy: Secondary | ICD-10-CM | POA: Diagnosis not present

## 2023-06-17 DIAGNOSIS — Z950 Presence of cardiac pacemaker: Secondary | ICD-10-CM | POA: Diagnosis not present

## 2023-06-17 DIAGNOSIS — M25551 Pain in right hip: Secondary | ICD-10-CM | POA: Diagnosis not present

## 2023-06-17 DIAGNOSIS — E440 Moderate protein-calorie malnutrition: Secondary | ICD-10-CM | POA: Diagnosis not present

## 2023-06-17 DIAGNOSIS — J1289 Other viral pneumonia: Secondary | ICD-10-CM | POA: Diagnosis not present

## 2023-06-17 DIAGNOSIS — F0671 Mild neurocognitive disorder due to known physiological condition with behavioral disturbance: Secondary | ICD-10-CM | POA: Diagnosis not present

## 2023-06-17 DIAGNOSIS — M818 Other osteoporosis without current pathological fracture: Secondary | ICD-10-CM | POA: Diagnosis not present

## 2023-06-17 DIAGNOSIS — M80051D Age-related osteoporosis with current pathological fracture, right femur, subsequent encounter for fracture with routine healing: Secondary | ICD-10-CM | POA: Diagnosis not present

## 2023-06-17 DIAGNOSIS — R54 Age-related physical debility: Secondary | ICD-10-CM | POA: Diagnosis not present

## 2023-06-17 DIAGNOSIS — S7291XS Unspecified fracture of right femur, sequela: Secondary | ICD-10-CM | POA: Diagnosis not present

## 2023-06-17 DIAGNOSIS — J189 Pneumonia, unspecified organism: Secondary | ICD-10-CM | POA: Diagnosis not present

## 2023-06-17 DIAGNOSIS — S7291XD Unspecified fracture of right femur, subsequent encounter for closed fracture with routine healing: Secondary | ICD-10-CM | POA: Diagnosis not present

## 2023-06-17 DIAGNOSIS — L89152 Pressure ulcer of sacral region, stage 2: Secondary | ICD-10-CM | POA: Diagnosis not present

## 2023-06-17 DIAGNOSIS — S7291XA Unspecified fracture of right femur, initial encounter for closed fracture: Secondary | ICD-10-CM | POA: Diagnosis not present

## 2023-06-17 NOTE — Telephone Encounter (Signed)
Spoke to patient's wife she wanted Dr.Croitoru to know husband fractured right hip.He had surgery last week and should be transferred to a rehab facility today.Advised I will make Dr.Croitoru aware.

## 2023-06-17 NOTE — Telephone Encounter (Signed)
Pt's wife just wanted to make the doctor aware the pt broke his hip and they will call to schedule an appt when he gets better. Please advise

## 2023-06-18 DIAGNOSIS — S7291XA Unspecified fracture of right femur, initial encounter for closed fracture: Secondary | ICD-10-CM | POA: Diagnosis not present

## 2023-06-18 DIAGNOSIS — M818 Other osteoporosis without current pathological fracture: Secondary | ICD-10-CM | POA: Diagnosis not present

## 2023-06-18 DIAGNOSIS — J1289 Other viral pneumonia: Secondary | ICD-10-CM | POA: Diagnosis not present

## 2023-06-20 DIAGNOSIS — G20C Parkinsonism, unspecified: Secondary | ICD-10-CM | POA: Diagnosis not present

## 2023-06-20 DIAGNOSIS — M80051D Age-related osteoporosis with current pathological fracture, right femur, subsequent encounter for fracture with routine healing: Secondary | ICD-10-CM | POA: Diagnosis not present

## 2023-06-20 DIAGNOSIS — E440 Moderate protein-calorie malnutrition: Secondary | ICD-10-CM | POA: Diagnosis not present

## 2023-06-20 DIAGNOSIS — F0671 Mild neurocognitive disorder due to known physiological condition with behavioral disturbance: Secondary | ICD-10-CM | POA: Diagnosis not present

## 2023-06-20 DIAGNOSIS — Z515 Encounter for palliative care: Secondary | ICD-10-CM | POA: Diagnosis not present

## 2023-06-26 DIAGNOSIS — M25551 Pain in right hip: Secondary | ICD-10-CM | POA: Diagnosis not present

## 2023-07-08 DIAGNOSIS — L89152 Pressure ulcer of sacral region, stage 2: Secondary | ICD-10-CM | POA: Diagnosis not present

## 2023-07-08 DIAGNOSIS — Z79899 Other long term (current) drug therapy: Secondary | ICD-10-CM | POA: Diagnosis not present

## 2023-07-08 DIAGNOSIS — Z66 Do not resuscitate: Secondary | ICD-10-CM | POA: Diagnosis not present

## 2023-07-26 DIAGNOSIS — S7291XS Unspecified fracture of right femur, sequela: Secondary | ICD-10-CM | POA: Diagnosis not present

## 2023-07-26 DIAGNOSIS — M818 Other osteoporosis without current pathological fracture: Secondary | ICD-10-CM | POA: Diagnosis not present

## 2023-07-28 ENCOUNTER — Encounter: Payer: Self-pay | Admitting: Cardiovascular Disease

## 2023-07-28 DIAGNOSIS — M25551 Pain in right hip: Secondary | ICD-10-CM | POA: Diagnosis not present

## 2023-07-29 DIAGNOSIS — G20A1 Parkinson's disease without dyskinesia, without mention of fluctuations: Secondary | ICD-10-CM | POA: Diagnosis not present

## 2023-07-29 DIAGNOSIS — F324 Major depressive disorder, single episode, in partial remission: Secondary | ICD-10-CM | POA: Diagnosis not present

## 2023-07-29 DIAGNOSIS — Z96641 Presence of right artificial hip joint: Secondary | ICD-10-CM | POA: Diagnosis not present

## 2023-07-29 DIAGNOSIS — G3183 Dementia with Lewy bodies: Secondary | ICD-10-CM | POA: Diagnosis not present

## 2023-07-29 DIAGNOSIS — M80051D Age-related osteoporosis with current pathological fracture, right femur, subsequent encounter for fracture with routine healing: Secondary | ICD-10-CM | POA: Diagnosis not present

## 2023-07-29 DIAGNOSIS — F0283 Dementia in other diseases classified elsewhere, unspecified severity, with mood disturbance: Secondary | ICD-10-CM | POA: Diagnosis not present

## 2023-07-29 DIAGNOSIS — L89152 Pressure ulcer of sacral region, stage 2: Secondary | ICD-10-CM | POA: Diagnosis not present

## 2023-07-29 DIAGNOSIS — Z950 Presence of cardiac pacemaker: Secondary | ICD-10-CM | POA: Diagnosis not present

## 2023-07-29 DIAGNOSIS — I110 Hypertensive heart disease with heart failure: Secondary | ICD-10-CM | POA: Diagnosis not present

## 2023-07-29 DIAGNOSIS — I5032 Chronic diastolic (congestive) heart failure: Secondary | ICD-10-CM | POA: Diagnosis not present

## 2023-07-30 ENCOUNTER — Encounter: Payer: Medicare Other | Admitting: Sports Medicine

## 2023-07-31 DIAGNOSIS — M80051D Age-related osteoporosis with current pathological fracture, right femur, subsequent encounter for fracture with routine healing: Secondary | ICD-10-CM | POA: Diagnosis not present

## 2023-07-31 DIAGNOSIS — G3183 Dementia with Lewy bodies: Secondary | ICD-10-CM | POA: Diagnosis not present

## 2023-07-31 DIAGNOSIS — L89152 Pressure ulcer of sacral region, stage 2: Secondary | ICD-10-CM | POA: Diagnosis not present

## 2023-07-31 DIAGNOSIS — F324 Major depressive disorder, single episode, in partial remission: Secondary | ICD-10-CM | POA: Diagnosis not present

## 2023-07-31 DIAGNOSIS — F0283 Dementia in other diseases classified elsewhere, unspecified severity, with mood disturbance: Secondary | ICD-10-CM | POA: Diagnosis not present

## 2023-07-31 DIAGNOSIS — G20A1 Parkinson's disease without dyskinesia, without mention of fluctuations: Secondary | ICD-10-CM | POA: Diagnosis not present

## 2023-08-01 DIAGNOSIS — G20A1 Parkinson's disease without dyskinesia, without mention of fluctuations: Secondary | ICD-10-CM | POA: Diagnosis not present

## 2023-08-01 DIAGNOSIS — F0283 Dementia in other diseases classified elsewhere, unspecified severity, with mood disturbance: Secondary | ICD-10-CM | POA: Diagnosis not present

## 2023-08-01 DIAGNOSIS — L89152 Pressure ulcer of sacral region, stage 2: Secondary | ICD-10-CM | POA: Diagnosis not present

## 2023-08-01 DIAGNOSIS — G3183 Dementia with Lewy bodies: Secondary | ICD-10-CM | POA: Diagnosis not present

## 2023-08-01 DIAGNOSIS — F324 Major depressive disorder, single episode, in partial remission: Secondary | ICD-10-CM | POA: Diagnosis not present

## 2023-08-01 DIAGNOSIS — M80051D Age-related osteoporosis with current pathological fracture, right femur, subsequent encounter for fracture with routine healing: Secondary | ICD-10-CM | POA: Diagnosis not present

## 2023-08-04 DIAGNOSIS — G20A1 Parkinson's disease without dyskinesia, without mention of fluctuations: Secondary | ICD-10-CM | POA: Diagnosis not present

## 2023-08-04 DIAGNOSIS — M80051D Age-related osteoporosis with current pathological fracture, right femur, subsequent encounter for fracture with routine healing: Secondary | ICD-10-CM | POA: Diagnosis not present

## 2023-08-04 DIAGNOSIS — L89152 Pressure ulcer of sacral region, stage 2: Secondary | ICD-10-CM | POA: Diagnosis not present

## 2023-08-04 DIAGNOSIS — F324 Major depressive disorder, single episode, in partial remission: Secondary | ICD-10-CM | POA: Diagnosis not present

## 2023-08-04 DIAGNOSIS — F0283 Dementia in other diseases classified elsewhere, unspecified severity, with mood disturbance: Secondary | ICD-10-CM | POA: Diagnosis not present

## 2023-08-04 DIAGNOSIS — G3183 Dementia with Lewy bodies: Secondary | ICD-10-CM | POA: Diagnosis not present

## 2023-08-05 ENCOUNTER — Ambulatory Visit (INDEPENDENT_AMBULATORY_CARE_PROVIDER_SITE_OTHER): Payer: Medicare Other

## 2023-08-05 DIAGNOSIS — I495 Sick sinus syndrome: Secondary | ICD-10-CM

## 2023-08-05 DIAGNOSIS — Z79899 Other long term (current) drug therapy: Secondary | ICD-10-CM | POA: Diagnosis not present

## 2023-08-05 DIAGNOSIS — Z66 Do not resuscitate: Secondary | ICD-10-CM | POA: Diagnosis not present

## 2023-08-05 DIAGNOSIS — L89152 Pressure ulcer of sacral region, stage 2: Secondary | ICD-10-CM | POA: Diagnosis not present

## 2023-08-05 LAB — CUP PACEART REMOTE DEVICE CHECK
Battery Remaining Longevity: 71 mo
Battery Remaining Percentage: 57 %
Battery Voltage: 2.99 V
Brady Statistic AP VP Percent: 1 %
Brady Statistic AP VS Percent: 56 %
Brady Statistic AS VP Percent: 1 %
Brady Statistic AS VS Percent: 44 %
Brady Statistic RA Percent Paced: 55 %
Brady Statistic RV Percent Paced: 1 %
Date Time Interrogation Session: 20241105020015
Implantable Lead Connection Status: 753985
Implantable Lead Connection Status: 753985
Implantable Lead Implant Date: 20191106
Implantable Lead Implant Date: 20191111
Implantable Lead Location: 753859
Implantable Lead Location: 753860
Implantable Lead Model: 1948
Implantable Pulse Generator Implant Date: 20191106
Lead Channel Impedance Value: 360 Ohm
Lead Channel Impedance Value: 540 Ohm
Lead Channel Pacing Threshold Amplitude: 0.5 V
Lead Channel Pacing Threshold Amplitude: 0.625 V
Lead Channel Pacing Threshold Pulse Width: 0.5 ms
Lead Channel Pacing Threshold Pulse Width: 0.5 ms
Lead Channel Sensing Intrinsic Amplitude: 1 mV
Lead Channel Sensing Intrinsic Amplitude: 3.7 mV
Lead Channel Setting Pacing Amplitude: 0.875
Lead Channel Setting Pacing Amplitude: 1.5 V
Lead Channel Setting Pacing Pulse Width: 0.5 ms
Lead Channel Setting Sensing Sensitivity: 0.5 mV
Pulse Gen Model: 2272
Pulse Gen Serial Number: 9083517

## 2023-08-07 DIAGNOSIS — M80051D Age-related osteoporosis with current pathological fracture, right femur, subsequent encounter for fracture with routine healing: Secondary | ICD-10-CM | POA: Diagnosis not present

## 2023-08-07 DIAGNOSIS — L89152 Pressure ulcer of sacral region, stage 2: Secondary | ICD-10-CM | POA: Diagnosis not present

## 2023-08-07 DIAGNOSIS — F0283 Dementia in other diseases classified elsewhere, unspecified severity, with mood disturbance: Secondary | ICD-10-CM | POA: Diagnosis not present

## 2023-08-07 DIAGNOSIS — G3183 Dementia with Lewy bodies: Secondary | ICD-10-CM | POA: Diagnosis not present

## 2023-08-07 DIAGNOSIS — F324 Major depressive disorder, single episode, in partial remission: Secondary | ICD-10-CM | POA: Diagnosis not present

## 2023-08-07 DIAGNOSIS — G20A1 Parkinson's disease without dyskinesia, without mention of fluctuations: Secondary | ICD-10-CM | POA: Diagnosis not present

## 2023-08-11 DIAGNOSIS — M80051D Age-related osteoporosis with current pathological fracture, right femur, subsequent encounter for fracture with routine healing: Secondary | ICD-10-CM | POA: Diagnosis not present

## 2023-08-11 DIAGNOSIS — G20A1 Parkinson's disease without dyskinesia, without mention of fluctuations: Secondary | ICD-10-CM | POA: Diagnosis not present

## 2023-08-11 DIAGNOSIS — G3183 Dementia with Lewy bodies: Secondary | ICD-10-CM | POA: Diagnosis not present

## 2023-08-11 DIAGNOSIS — F0283 Dementia in other diseases classified elsewhere, unspecified severity, with mood disturbance: Secondary | ICD-10-CM | POA: Diagnosis not present

## 2023-08-11 DIAGNOSIS — F324 Major depressive disorder, single episode, in partial remission: Secondary | ICD-10-CM | POA: Diagnosis not present

## 2023-08-11 DIAGNOSIS — L89152 Pressure ulcer of sacral region, stage 2: Secondary | ICD-10-CM | POA: Diagnosis not present

## 2023-08-12 ENCOUNTER — Ambulatory Visit (INDEPENDENT_AMBULATORY_CARE_PROVIDER_SITE_OTHER): Payer: Medicare Other | Admitting: Sports Medicine

## 2023-08-12 ENCOUNTER — Encounter: Payer: Self-pay | Admitting: Sports Medicine

## 2023-08-12 VITALS — BP 122/62 | HR 106 | Temp 97.7°F | Resp 16 | Ht 69.0 in | Wt 153.0 lb

## 2023-08-12 DIAGNOSIS — G3183 Dementia with Lewy bodies: Secondary | ICD-10-CM

## 2023-08-12 DIAGNOSIS — F324 Major depressive disorder, single episode, in partial remission: Secondary | ICD-10-CM | POA: Diagnosis not present

## 2023-08-12 DIAGNOSIS — M80051D Age-related osteoporosis with current pathological fracture, right femur, subsequent encounter for fracture with routine healing: Secondary | ICD-10-CM | POA: Diagnosis not present

## 2023-08-12 DIAGNOSIS — L219 Seborrheic dermatitis, unspecified: Secondary | ICD-10-CM

## 2023-08-12 DIAGNOSIS — R2681 Unsteadiness on feet: Secondary | ICD-10-CM

## 2023-08-12 DIAGNOSIS — L89152 Pressure ulcer of sacral region, stage 2: Secondary | ICD-10-CM | POA: Diagnosis not present

## 2023-08-12 DIAGNOSIS — M7989 Other specified soft tissue disorders: Secondary | ICD-10-CM | POA: Diagnosis not present

## 2023-08-12 DIAGNOSIS — F0283 Dementia in other diseases classified elsewhere, unspecified severity, with mood disturbance: Secondary | ICD-10-CM | POA: Diagnosis not present

## 2023-08-12 DIAGNOSIS — D649 Anemia, unspecified: Secondary | ICD-10-CM

## 2023-08-12 DIAGNOSIS — F0280 Dementia in other diseases classified elsewhere without behavioral disturbance: Secondary | ICD-10-CM

## 2023-08-12 DIAGNOSIS — G20A1 Parkinson's disease without dyskinesia, without mention of fluctuations: Secondary | ICD-10-CM | POA: Diagnosis not present

## 2023-08-12 LAB — CBC WITH DIFFERENTIAL/PLATELET
Absolute Lymphocytes: 2462 {cells}/uL (ref 850–3900)
Absolute Monocytes: 827 {cells}/uL (ref 200–950)
Basophils Absolute: 113 {cells}/uL (ref 0–200)
Basophils Relative: 1.3 %
Eosinophils Absolute: 252 {cells}/uL (ref 15–500)
Eosinophils Relative: 2.9 %
HCT: 37.6 % — ABNORMAL LOW (ref 38.5–50.0)
Hemoglobin: 11.8 g/dL — ABNORMAL LOW (ref 13.2–17.1)
MCH: 28.2 pg (ref 27.0–33.0)
MCHC: 31.4 g/dL — ABNORMAL LOW (ref 32.0–36.0)
MCV: 90 fL (ref 80.0–100.0)
MPV: 10.9 fL (ref 7.5–12.5)
Monocytes Relative: 9.5 %
Neutro Abs: 5046 {cells}/uL (ref 1500–7800)
Neutrophils Relative %: 58 %
Platelets: 297 10*3/uL (ref 140–400)
RBC: 4.18 10*6/uL — ABNORMAL LOW (ref 4.20–5.80)
RDW: 12.8 % (ref 11.0–15.0)
Total Lymphocyte: 28.3 %
WBC: 8.7 10*3/uL (ref 3.8–10.8)

## 2023-08-12 MED ORDER — MOMETASONE FUROATE 0.1 % EX CREA
1.0000 | TOPICAL_CREAM | Freq: Every day | CUTANEOUS | 3 refills | Status: DC
Start: 2023-08-12 — End: 2024-03-29

## 2023-08-12 MED ORDER — FUROSEMIDE 20 MG PO TABS: 20.0000 mg | ORAL_TABLET | Freq: Every day | ORAL | 0 refills | Status: DC

## 2023-08-12 NOTE — Progress Notes (Unsigned)
Careteam: Patient Care Team: Charles Sheffield, MD as PCP - General (Internal Medicine) Croitoru, [name], MD as PCP - Cardiology (Cardiology)  PLACE OF SERVICE:  [facility]  Advanced Directive information Does Patient Have a Medical Advance Directive?: No, Would patient like information on creating a medical advance directive?: No - Patient declined  Allergies  Allergen Reactions   Propofol Anaphylaxis    Chief Complaint  Patient presents with   Medical Management of Chronic Issues    6 month follow up.    Immunizations    Discuss the need for Shingrix vaccine, and Covid Booster.    Health Maintenance    Discuss the need for AWV.   Concern     Patient had broken hip and was in rehab.      HPI: Patient is a 82 y.o. male presented to clinic for routine follow up  Accompanied by his wife , son Ambulates with a walker Has memory problems  Has HH since he had rt hip replacement recently No behavioral problems   Chronic sacral ulcer  Follows with wound clinic  Last seen by wound clinic about a week ago  Family states that wound is getting better    Chronic lower extremity swelling  Pt does not keep feet elevated  Does not use compression stockings Denies sob    Lewy body dementia Follows with neurology  Cont with sinemet    Review of Systems:  Review of Systems  Constitutional:  Negative for chills and fever.  HENT:  Negative for sore throat.   Respiratory:  Negative for cough, sputum production and shortness of breath.   Cardiovascular:  Negative for chest pain and palpitations.  Gastrointestinal:  Negative for abdominal pain, heartburn and nausea.  Genitourinary:  Negative for dysuria, frequency and hematuria.  Musculoskeletal:  Negative for falls and myalgias.  Neurological:  Negative for dizziness, sensory change and focal weakness.     Past Medical History:  Diagnosis Date   AC joint separation    Right shoulder   Acute pericarditis  08/09/2018   Admitted 11/19 for pericarditis; ? 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   SSS (sick sinus syndrome) (HCC)    Past Surgical History:  Procedure Laterality Date   acromioclavicular separation  07/04/2016   CHEST TUBE INSERTION Left 08/05/2018   EYE SURGERY     HERNIA REPAIR     LEAD REVISION/REPAIR N/A 08/10/2018   Procedure: LEAD REVISION/REPAIR;  Surgeon: Duke Salvia, MD;  Location: [facility] INVASIVE CV LAB;  Service: Cardiovascular;  Laterality: N/A;   NM MYOVIEW LTD     PACEMAKER IMPLANT N/A 08/05/2018   Procedure: St Jude dual lead PACEMAKER IMPLANT;  Surgeon: Thurmon Fair, MD;  Location: [facility] INVASIVE CV LAB;  Service: Cardiovascular;  Laterality: N/A;   Right ingunial hernia repair     US ECHOCARDIOGRAPHY     Social History:   reports that he has never smoked. He has never used smokeless tobacco. He reports that he does not drink alcohol and does not use drugs.  Family History  Problem Relation Age of Onset  Hypertension Mother    Lung cancer Mother    Diabetes Mother    Thyroid disease Mother    Angina Father    Heart attack Father    Heart disease Brother    Diabetes Brother    Heart attack Brother    Asthma Maternal Grandmother    Stroke Maternal Grandfather    Diabetes Paternal Grandfather    Heart Problems Brother    Dementia Neg Hx     Medications: Patient's Medications  New Prescriptions   No medications on file  Previous Medications   ACETAMINOPHEN (TYLENOL) 325 MG TABLET    Take 650 mg by mouth every 6 (six) hours as needed for mild pain.   CARBIDOPA-LEVODOPA (SINEMET IR) 25-100 MG TABLET    Take 1 tablet by mouth 3  (three) times daily. (0830, 1330, & 2030)   ESCITALOPRAM (LEXAPRO) 10 MG TABLET    TAKE 1 TABLET BY MOUTH AT BEDTIME   GABAPENTIN (NEURONTIN) 100 MG CAPSULE    Take 1 capsule (100 mg total) by mouth 2 (two) times daily as needed.   L-METHYLFOLATE-B12-B6-B2 (CEREFOLIN) 02-28-49-5 MG TABS    Take 1 tablet by mouth daily.   MELATONIN-PYRIDOXINE (MELATIN PO)    Take 3 mg by mouth at bedtime.   MIRTAZAPINE (REMERON) 15 MG TABLET    Take 1 tablet (15 mg total) by mouth at bedtime.   MOMETASONE (ELOCON) 0.1 % CREAM    Apply 1 application topically daily. To dry red scaly areas on face   MULTIPLE VITAMIN (MULTIVITAMIN WITH MINERALS) TABS TABLET    Take 1 tablet by mouth daily.  Modified Medications   No medications on file  Discontinued Medications   ASPIRIN EC 81 MG TABLET    Take 81 mg by mouth daily.   IBUPROFEN (IBU) 400 MG TABLET    Take 1 tablet (400 mg total) by mouth every 8 (eight) hours as needed.    Physical Exam:  Vitals:   08/12/23 1425  BP: 122/62  Pulse: (!) 106  Resp: 16  Temp: 97.7 F (36.5 C)  SpO2: 96%  Weight: 153 lb (69.4 kg)  Height: 5\' 9"  (1.753 m)   Body mass index is 22.59 kg/m. Wt Readings from Last 3 Encounters:  08/12/23 153 lb (69.4 kg)  06/09/23 141 lb (64 kg)  06/03/23 141 lb (64 kg)    Physical Exam Constitutional:      Appearance: Normal appearance.  HENT:     Head: Normocephalic and atraumatic.  Cardiovascular:     Rate and Rhythm: Normal rate and regular rhythm.     Pulses: Normal pulses.     Heart sounds: Normal heart sounds.  Pulmonary:     Effort: No respiratory distress.     Breath sounds: No stridor. No wheezing or rales.  Abdominal:     General: Bowel sounds are normal. There is no distension.     Palpations: Abdomen is soft.     Tenderness: There is no abdominal tenderness. There is no right CVA tenderness or guarding.  Musculoskeletal:        General: No swelling.  Neurological:     Mental Status: He is alert. Mental status is at  baseline.     Sensory: No sensory deficit.     Motor: No weakness.      Labs reviewed: Basic Metabolic Panel: No results for input(s): "NA", "K", "CL", "CO2", "GLUCOSE", "BUN", "CREATININE", "CALCIUM", "MG", "PHOS", "TSH" in the last 8760 hours. Liver Function Tests: No results for  input(s): "AST", "ALT", "ALKPHOS", "BILITOT", "PROT", "ALBUMIN" in the last 8760 hours. No results for input(s): "LIPASE", "AMYLASE" in the last 8760 hours. No results for input(s): "AMMONIA" in the last 8760 hours. CBC: No results for input(s): "WBC", "NEUTROABS", "HGB", "HCT", "MCV", "PLT" in the last 8760 hours. Lipid Panel: No results for input(s): "CHOL", "HDL", "LDLCALC", "TRIG", "CHOLHDL", "LDLDIRECT" in the last 8760 hours. TSH: No results for input(s): "TSH" in the last 8760 hours. A1C: Lab Results  Component Value Date   HGBA1C 5.7 (H) 02/26/2023     Assessment/Plan  1. Seborrheic dermatitis  - mometasone (ELOCON) 0.1 % cream; Apply 1 Application topically daily. To dry red scaly areas on face  Dispense: 45 g; Refill: 3  2. Anemia, unspecified type  No signs of bleeding Will repeat cbc - CBC With Differential/Platelet  3. Lewy body dementia without behavioral disturbance (HCC)  No behavioral manifestations as per family  Cont with sinemet Follow up neurology  Cont with supportive care  4. Unsteady gait when walking  Instructed to use walker all times  Cont with home PT   5. Swelling of lower extremity  Lungs clear Swelling noted in both legs Instructed to elevate feet  Use compression stockings  - furosemide (LASIX) 20 MG tablet; Take 1 tablet (20 mg total) by mouth daily.  Dispense: 7 tablet; Refill: 0   No follow-ups on file.:  3 months   I spent greater than 30  minutes for the care of this patient in face to face time, chart review, clinical documentation, patient education.

## 2023-08-13 ENCOUNTER — Encounter: Payer: Self-pay | Admitting: Sports Medicine

## 2023-08-13 ENCOUNTER — Ambulatory Visit: Payer: Medicare Other | Admitting: Sports Medicine

## 2023-08-14 DIAGNOSIS — F0283 Dementia in other diseases classified elsewhere, unspecified severity, with mood disturbance: Secondary | ICD-10-CM | POA: Diagnosis not present

## 2023-08-14 DIAGNOSIS — F324 Major depressive disorder, single episode, in partial remission: Secondary | ICD-10-CM | POA: Diagnosis not present

## 2023-08-14 DIAGNOSIS — G3183 Dementia with Lewy bodies: Secondary | ICD-10-CM | POA: Diagnosis not present

## 2023-08-14 DIAGNOSIS — G20A1 Parkinson's disease without dyskinesia, without mention of fluctuations: Secondary | ICD-10-CM | POA: Diagnosis not present

## 2023-08-14 DIAGNOSIS — L89152 Pressure ulcer of sacral region, stage 2: Secondary | ICD-10-CM | POA: Diagnosis not present

## 2023-08-14 DIAGNOSIS — M80051D Age-related osteoporosis with current pathological fracture, right femur, subsequent encounter for fracture with routine healing: Secondary | ICD-10-CM | POA: Diagnosis not present

## 2023-08-15 DIAGNOSIS — G3183 Dementia with Lewy bodies: Secondary | ICD-10-CM | POA: Diagnosis not present

## 2023-08-15 DIAGNOSIS — M80051D Age-related osteoporosis with current pathological fracture, right femur, subsequent encounter for fracture with routine healing: Secondary | ICD-10-CM | POA: Diagnosis not present

## 2023-08-15 DIAGNOSIS — F0283 Dementia in other diseases classified elsewhere, unspecified severity, with mood disturbance: Secondary | ICD-10-CM | POA: Diagnosis not present

## 2023-08-15 DIAGNOSIS — G20A1 Parkinson's disease without dyskinesia, without mention of fluctuations: Secondary | ICD-10-CM | POA: Diagnosis not present

## 2023-08-15 DIAGNOSIS — F324 Major depressive disorder, single episode, in partial remission: Secondary | ICD-10-CM | POA: Diagnosis not present

## 2023-08-15 DIAGNOSIS — L89152 Pressure ulcer of sacral region, stage 2: Secondary | ICD-10-CM | POA: Diagnosis not present

## 2023-08-18 DIAGNOSIS — G20A1 Parkinson's disease without dyskinesia, without mention of fluctuations: Secondary | ICD-10-CM | POA: Diagnosis not present

## 2023-08-18 DIAGNOSIS — F324 Major depressive disorder, single episode, in partial remission: Secondary | ICD-10-CM | POA: Diagnosis not present

## 2023-08-18 DIAGNOSIS — M80051D Age-related osteoporosis with current pathological fracture, right femur, subsequent encounter for fracture with routine healing: Secondary | ICD-10-CM | POA: Diagnosis not present

## 2023-08-18 DIAGNOSIS — L89152 Pressure ulcer of sacral region, stage 2: Secondary | ICD-10-CM | POA: Diagnosis not present

## 2023-08-18 DIAGNOSIS — G3183 Dementia with Lewy bodies: Secondary | ICD-10-CM | POA: Diagnosis not present

## 2023-08-18 DIAGNOSIS — F0283 Dementia in other diseases classified elsewhere, unspecified severity, with mood disturbance: Secondary | ICD-10-CM | POA: Diagnosis not present

## 2023-08-19 ENCOUNTER — Telehealth: Payer: Self-pay

## 2023-08-19 DIAGNOSIS — M7989 Other specified soft tissue disorders: Secondary | ICD-10-CM

## 2023-08-19 DIAGNOSIS — F324 Major depressive disorder, single episode, in partial remission: Secondary | ICD-10-CM | POA: Diagnosis not present

## 2023-08-19 DIAGNOSIS — F0283 Dementia in other diseases classified elsewhere, unspecified severity, with mood disturbance: Secondary | ICD-10-CM | POA: Diagnosis not present

## 2023-08-19 DIAGNOSIS — G3183 Dementia with Lewy bodies: Secondary | ICD-10-CM | POA: Diagnosis not present

## 2023-08-19 DIAGNOSIS — L89152 Pressure ulcer of sacral region, stage 2: Secondary | ICD-10-CM | POA: Diagnosis not present

## 2023-08-19 DIAGNOSIS — G20A1 Parkinson's disease without dyskinesia, without mention of fluctuations: Secondary | ICD-10-CM | POA: Diagnosis not present

## 2023-08-19 DIAGNOSIS — M80051D Age-related osteoporosis with current pathological fracture, right femur, subsequent encounter for fracture with routine healing: Secondary | ICD-10-CM | POA: Diagnosis not present

## 2023-08-19 MED ORDER — FUROSEMIDE 20 MG PO TABS
20.0000 mg | ORAL_TABLET | Freq: Every day | ORAL | 0 refills | Status: DC
Start: 2023-08-19 — End: 2023-09-01

## 2023-08-19 NOTE — Addendum Note (Signed)
Addended by: [name] on: 08/19/2023 02:33 PM   Modules accepted: Orders

## 2023-08-19 NOTE — Telephone Encounter (Signed)
Charles Sheffield, MD  You26 minutes ago (2:33 PM)    Can take lasix 20 mg as needed for swelling Elevate feet and use compression stockings Needs follow up appt in 2-3 weeks with me   Patients wife was advised of the above message and verbalized understanding. She schedule an follow-up appointment on 09/16/23.

## 2023-08-19 NOTE — Telephone Encounter (Signed)
Rosalita Chessman called to report that patient while visiting the patient at his home she noticed left leg swelling more than the right leg. Patient is elevating his legs throughout the day. She also was concerned about the patients weight and states it keeps fluctuating between 148-151 lbs. The wife would like to know if patient needs to continue the Lasix that were prescribed and if so could an prescription be send into pharmacy for more tablets due to only 7 tablets send last time. Please advise.

## 2023-08-21 DIAGNOSIS — F0283 Dementia in other diseases classified elsewhere, unspecified severity, with mood disturbance: Secondary | ICD-10-CM | POA: Diagnosis not present

## 2023-08-21 DIAGNOSIS — G20A1 Parkinson's disease without dyskinesia, without mention of fluctuations: Secondary | ICD-10-CM | POA: Diagnosis not present

## 2023-08-21 DIAGNOSIS — G3183 Dementia with Lewy bodies: Secondary | ICD-10-CM | POA: Diagnosis not present

## 2023-08-21 DIAGNOSIS — F324 Major depressive disorder, single episode, in partial remission: Secondary | ICD-10-CM | POA: Diagnosis not present

## 2023-08-21 DIAGNOSIS — M80051D Age-related osteoporosis with current pathological fracture, right femur, subsequent encounter for fracture with routine healing: Secondary | ICD-10-CM | POA: Diagnosis not present

## 2023-08-21 DIAGNOSIS — L89152 Pressure ulcer of sacral region, stage 2: Secondary | ICD-10-CM | POA: Diagnosis not present

## 2023-08-22 DIAGNOSIS — G20A1 Parkinson's disease without dyskinesia, without mention of fluctuations: Secondary | ICD-10-CM | POA: Diagnosis not present

## 2023-08-22 DIAGNOSIS — F0283 Dementia in other diseases classified elsewhere, unspecified severity, with mood disturbance: Secondary | ICD-10-CM | POA: Diagnosis not present

## 2023-08-22 DIAGNOSIS — F324 Major depressive disorder, single episode, in partial remission: Secondary | ICD-10-CM | POA: Diagnosis not present

## 2023-08-22 DIAGNOSIS — L89152 Pressure ulcer of sacral region, stage 2: Secondary | ICD-10-CM | POA: Diagnosis not present

## 2023-08-22 DIAGNOSIS — M80051D Age-related osteoporosis with current pathological fracture, right femur, subsequent encounter for fracture with routine healing: Secondary | ICD-10-CM | POA: Diagnosis not present

## 2023-08-22 DIAGNOSIS — G3183 Dementia with Lewy bodies: Secondary | ICD-10-CM | POA: Diagnosis not present

## 2023-08-25 DIAGNOSIS — M80051D Age-related osteoporosis with current pathological fracture, right femur, subsequent encounter for fracture with routine healing: Secondary | ICD-10-CM | POA: Diagnosis not present

## 2023-08-25 DIAGNOSIS — F324 Major depressive disorder, single episode, in partial remission: Secondary | ICD-10-CM | POA: Diagnosis not present

## 2023-08-25 DIAGNOSIS — L89152 Pressure ulcer of sacral region, stage 2: Secondary | ICD-10-CM | POA: Diagnosis not present

## 2023-08-25 DIAGNOSIS — G20A1 Parkinson's disease without dyskinesia, without mention of fluctuations: Secondary | ICD-10-CM | POA: Diagnosis not present

## 2023-08-25 DIAGNOSIS — G3183 Dementia with Lewy bodies: Secondary | ICD-10-CM | POA: Diagnosis not present

## 2023-08-25 DIAGNOSIS — F0283 Dementia in other diseases classified elsewhere, unspecified severity, with mood disturbance: Secondary | ICD-10-CM | POA: Diagnosis not present

## 2023-08-26 DIAGNOSIS — Z888 Allergy status to other drugs, medicaments and biological substances status: Secondary | ICD-10-CM | POA: Diagnosis not present

## 2023-08-26 DIAGNOSIS — L89152 Pressure ulcer of sacral region, stage 2: Secondary | ICD-10-CM | POA: Diagnosis not present

## 2023-08-28 DIAGNOSIS — G20A1 Parkinson's disease without dyskinesia, without mention of fluctuations: Secondary | ICD-10-CM | POA: Diagnosis not present

## 2023-08-28 DIAGNOSIS — Z96641 Presence of right artificial hip joint: Secondary | ICD-10-CM | POA: Diagnosis not present

## 2023-08-28 DIAGNOSIS — G3183 Dementia with Lewy bodies: Secondary | ICD-10-CM | POA: Diagnosis not present

## 2023-08-28 DIAGNOSIS — L89152 Pressure ulcer of sacral region, stage 2: Secondary | ICD-10-CM | POA: Diagnosis not present

## 2023-08-28 DIAGNOSIS — Z950 Presence of cardiac pacemaker: Secondary | ICD-10-CM | POA: Diagnosis not present

## 2023-08-28 DIAGNOSIS — I110 Hypertensive heart disease with heart failure: Secondary | ICD-10-CM | POA: Diagnosis not present

## 2023-08-28 DIAGNOSIS — F324 Major depressive disorder, single episode, in partial remission: Secondary | ICD-10-CM | POA: Diagnosis not present

## 2023-08-28 DIAGNOSIS — F0283 Dementia in other diseases classified elsewhere, unspecified severity, with mood disturbance: Secondary | ICD-10-CM | POA: Diagnosis not present

## 2023-08-28 DIAGNOSIS — I5032 Chronic diastolic (congestive) heart failure: Secondary | ICD-10-CM | POA: Diagnosis not present

## 2023-08-28 DIAGNOSIS — M80051D Age-related osteoporosis with current pathological fracture, right femur, subsequent encounter for fracture with routine healing: Secondary | ICD-10-CM | POA: Diagnosis not present

## 2023-08-29 DIAGNOSIS — G20A1 Parkinson's disease without dyskinesia, without mention of fluctuations: Secondary | ICD-10-CM | POA: Diagnosis not present

## 2023-08-29 DIAGNOSIS — G3183 Dementia with Lewy bodies: Secondary | ICD-10-CM | POA: Diagnosis not present

## 2023-08-29 DIAGNOSIS — L89152 Pressure ulcer of sacral region, stage 2: Secondary | ICD-10-CM | POA: Diagnosis not present

## 2023-08-29 DIAGNOSIS — M80051D Age-related osteoporosis with current pathological fracture, right femur, subsequent encounter for fracture with routine healing: Secondary | ICD-10-CM | POA: Diagnosis not present

## 2023-08-29 DIAGNOSIS — F324 Major depressive disorder, single episode, in partial remission: Secondary | ICD-10-CM | POA: Diagnosis not present

## 2023-08-29 DIAGNOSIS — F0283 Dementia in other diseases classified elsewhere, unspecified severity, with mood disturbance: Secondary | ICD-10-CM | POA: Diagnosis not present

## 2023-08-30 ENCOUNTER — Other Ambulatory Visit: Payer: Self-pay | Admitting: Sports Medicine

## 2023-08-30 DIAGNOSIS — M7989 Other specified soft tissue disorders: Secondary | ICD-10-CM

## 2023-09-01 DIAGNOSIS — F0283 Dementia in other diseases classified elsewhere, unspecified severity, with mood disturbance: Secondary | ICD-10-CM | POA: Diagnosis not present

## 2023-09-01 DIAGNOSIS — L89152 Pressure ulcer of sacral region, stage 2: Secondary | ICD-10-CM | POA: Diagnosis not present

## 2023-09-01 DIAGNOSIS — F324 Major depressive disorder, single episode, in partial remission: Secondary | ICD-10-CM | POA: Diagnosis not present

## 2023-09-01 DIAGNOSIS — G20A1 Parkinson's disease without dyskinesia, without mention of fluctuations: Secondary | ICD-10-CM | POA: Diagnosis not present

## 2023-09-01 DIAGNOSIS — G3183 Dementia with Lewy bodies: Secondary | ICD-10-CM | POA: Diagnosis not present

## 2023-09-01 DIAGNOSIS — M80051D Age-related osteoporosis with current pathological fracture, right femur, subsequent encounter for fracture with routine healing: Secondary | ICD-10-CM | POA: Diagnosis not present

## 2023-09-02 DIAGNOSIS — L89152 Pressure ulcer of sacral region, stage 2: Secondary | ICD-10-CM | POA: Diagnosis not present

## 2023-09-02 DIAGNOSIS — F324 Major depressive disorder, single episode, in partial remission: Secondary | ICD-10-CM | POA: Diagnosis not present

## 2023-09-02 DIAGNOSIS — M80051D Age-related osteoporosis with current pathological fracture, right femur, subsequent encounter for fracture with routine healing: Secondary | ICD-10-CM | POA: Diagnosis not present

## 2023-09-02 DIAGNOSIS — G3183 Dementia with Lewy bodies: Secondary | ICD-10-CM | POA: Diagnosis not present

## 2023-09-02 DIAGNOSIS — G20A1 Parkinson's disease without dyskinesia, without mention of fluctuations: Secondary | ICD-10-CM | POA: Diagnosis not present

## 2023-09-02 DIAGNOSIS — F0283 Dementia in other diseases classified elsewhere, unspecified severity, with mood disturbance: Secondary | ICD-10-CM | POA: Diagnosis not present

## 2023-09-02 NOTE — Progress Notes (Signed)
Remote pacemaker transmission.   

## 2023-09-05 ENCOUNTER — Telehealth: Payer: Self-pay | Admitting: *Deleted

## 2023-09-05 DIAGNOSIS — F324 Major depressive disorder, single episode, in partial remission: Secondary | ICD-10-CM | POA: Diagnosis not present

## 2023-09-05 DIAGNOSIS — M80051D Age-related osteoporosis with current pathological fracture, right femur, subsequent encounter for fracture with routine healing: Secondary | ICD-10-CM | POA: Diagnosis not present

## 2023-09-05 DIAGNOSIS — L89152 Pressure ulcer of sacral region, stage 2: Secondary | ICD-10-CM | POA: Diagnosis not present

## 2023-09-05 DIAGNOSIS — G20A1 Parkinson's disease without dyskinesia, without mention of fluctuations: Secondary | ICD-10-CM | POA: Diagnosis not present

## 2023-09-05 DIAGNOSIS — F0283 Dementia in other diseases classified elsewhere, unspecified severity, with mood disturbance: Secondary | ICD-10-CM | POA: Diagnosis not present

## 2023-09-05 DIAGNOSIS — G3183 Dementia with Lewy bodies: Secondary | ICD-10-CM | POA: Diagnosis not present

## 2023-09-05 NOTE — Telephone Encounter (Signed)
Patient Wife, Charles [name] called and stated that patient needed a refill on his Furosemide. Stated that she called earlier in the week for this refill and it has not been sent to pharmacy yet.   I confirmed that Rx was sent to [facility] with Confirmation on 09/01/2023 for #30.   Patient's wife is going to call [facility] pharmacy and check on the refill status.

## 2023-09-09 DIAGNOSIS — Z66 Do not resuscitate: Secondary | ICD-10-CM | POA: Diagnosis not present

## 2023-09-09 DIAGNOSIS — L89152 Pressure ulcer of sacral region, stage 2: Secondary | ICD-10-CM | POA: Diagnosis not present

## 2023-09-09 DIAGNOSIS — M1990 Unspecified osteoarthritis, unspecified site: Secondary | ICD-10-CM | POA: Diagnosis not present

## 2023-09-10 DIAGNOSIS — F324 Major depressive disorder, single episode, in partial remission: Secondary | ICD-10-CM | POA: Diagnosis not present

## 2023-09-10 DIAGNOSIS — F0283 Dementia in other diseases classified elsewhere, unspecified severity, with mood disturbance: Secondary | ICD-10-CM | POA: Diagnosis not present

## 2023-09-10 DIAGNOSIS — G20A1 Parkinson's disease without dyskinesia, without mention of fluctuations: Secondary | ICD-10-CM | POA: Diagnosis not present

## 2023-09-10 DIAGNOSIS — G3183 Dementia with Lewy bodies: Secondary | ICD-10-CM | POA: Diagnosis not present

## 2023-09-10 DIAGNOSIS — L89152 Pressure ulcer of sacral region, stage 2: Secondary | ICD-10-CM | POA: Diagnosis not present

## 2023-09-10 DIAGNOSIS — M80051D Age-related osteoporosis with current pathological fracture, right femur, subsequent encounter for fracture with routine healing: Secondary | ICD-10-CM | POA: Diagnosis not present

## 2023-09-12 DIAGNOSIS — Z515 Encounter for palliative care: Secondary | ICD-10-CM | POA: Diagnosis not present

## 2023-09-12 DIAGNOSIS — G3183 Dementia with Lewy bodies: Secondary | ICD-10-CM | POA: Diagnosis not present

## 2023-09-12 DIAGNOSIS — E440 Moderate protein-calorie malnutrition: Secondary | ICD-10-CM | POA: Diagnosis not present

## 2023-09-12 DIAGNOSIS — G20C Parkinsonism, unspecified: Secondary | ICD-10-CM | POA: Diagnosis not present

## 2023-09-12 DIAGNOSIS — F0671 Mild neurocognitive disorder due to known physiological condition with behavioral disturbance: Secondary | ICD-10-CM | POA: Diagnosis not present

## 2023-09-12 DIAGNOSIS — M80051D Age-related osteoporosis with current pathological fracture, right femur, subsequent encounter for fracture with routine healing: Secondary | ICD-10-CM | POA: Diagnosis not present

## 2023-09-12 DIAGNOSIS — F324 Major depressive disorder, single episode, in partial remission: Secondary | ICD-10-CM | POA: Diagnosis not present

## 2023-09-12 DIAGNOSIS — G20A1 Parkinson's disease without dyskinesia, without mention of fluctuations: Secondary | ICD-10-CM | POA: Diagnosis not present

## 2023-09-12 DIAGNOSIS — L89152 Pressure ulcer of sacral region, stage 2: Secondary | ICD-10-CM | POA: Diagnosis not present

## 2023-09-12 DIAGNOSIS — F0283 Dementia in other diseases classified elsewhere, unspecified severity, with mood disturbance: Secondary | ICD-10-CM | POA: Diagnosis not present

## 2023-09-15 DIAGNOSIS — L89152 Pressure ulcer of sacral region, stage 2: Secondary | ICD-10-CM | POA: Diagnosis not present

## 2023-09-15 DIAGNOSIS — F324 Major depressive disorder, single episode, in partial remission: Secondary | ICD-10-CM | POA: Diagnosis not present

## 2023-09-15 DIAGNOSIS — G20A1 Parkinson's disease without dyskinesia, without mention of fluctuations: Secondary | ICD-10-CM | POA: Diagnosis not present

## 2023-09-15 DIAGNOSIS — F0283 Dementia in other diseases classified elsewhere, unspecified severity, with mood disturbance: Secondary | ICD-10-CM | POA: Diagnosis not present

## 2023-09-15 DIAGNOSIS — G3183 Dementia with Lewy bodies: Secondary | ICD-10-CM | POA: Diagnosis not present

## 2023-09-15 DIAGNOSIS — M80051D Age-related osteoporosis with current pathological fracture, right femur, subsequent encounter for fracture with routine healing: Secondary | ICD-10-CM | POA: Diagnosis not present

## 2023-09-16 ENCOUNTER — Ambulatory Visit: Payer: Medicare Other | Admitting: Sports Medicine

## 2023-09-17 DIAGNOSIS — F324 Major depressive disorder, single episode, in partial remission: Secondary | ICD-10-CM | POA: Diagnosis not present

## 2023-09-17 DIAGNOSIS — G20A1 Parkinson's disease without dyskinesia, without mention of fluctuations: Secondary | ICD-10-CM | POA: Diagnosis not present

## 2023-09-17 DIAGNOSIS — G3183 Dementia with Lewy bodies: Secondary | ICD-10-CM | POA: Diagnosis not present

## 2023-09-17 DIAGNOSIS — L89152 Pressure ulcer of sacral region, stage 2: Secondary | ICD-10-CM | POA: Diagnosis not present

## 2023-09-17 DIAGNOSIS — M80051D Age-related osteoporosis with current pathological fracture, right femur, subsequent encounter for fracture with routine healing: Secondary | ICD-10-CM | POA: Diagnosis not present

## 2023-09-17 DIAGNOSIS — F0283 Dementia in other diseases classified elsewhere, unspecified severity, with mood disturbance: Secondary | ICD-10-CM | POA: Diagnosis not present

## 2023-09-18 DIAGNOSIS — M25551 Pain in right hip: Secondary | ICD-10-CM | POA: Diagnosis not present

## 2023-09-21 DIAGNOSIS — G20A1 Parkinson's disease without dyskinesia, without mention of fluctuations: Secondary | ICD-10-CM | POA: Diagnosis not present

## 2023-09-21 DIAGNOSIS — G3183 Dementia with Lewy bodies: Secondary | ICD-10-CM | POA: Diagnosis not present

## 2023-09-21 DIAGNOSIS — M80051D Age-related osteoporosis with current pathological fracture, right femur, subsequent encounter for fracture with routine healing: Secondary | ICD-10-CM | POA: Diagnosis not present

## 2023-09-21 DIAGNOSIS — L89152 Pressure ulcer of sacral region, stage 2: Secondary | ICD-10-CM | POA: Diagnosis not present

## 2023-09-21 DIAGNOSIS — F324 Major depressive disorder, single episode, in partial remission: Secondary | ICD-10-CM | POA: Diagnosis not present

## 2023-09-21 DIAGNOSIS — F0283 Dementia in other diseases classified elsewhere, unspecified severity, with mood disturbance: Secondary | ICD-10-CM | POA: Diagnosis not present

## 2023-09-22 DIAGNOSIS — L89152 Pressure ulcer of sacral region, stage 2: Secondary | ICD-10-CM | POA: Diagnosis not present

## 2023-09-22 DIAGNOSIS — G3183 Dementia with Lewy bodies: Secondary | ICD-10-CM | POA: Diagnosis not present

## 2023-09-22 DIAGNOSIS — F0283 Dementia in other diseases classified elsewhere, unspecified severity, with mood disturbance: Secondary | ICD-10-CM | POA: Diagnosis not present

## 2023-09-22 DIAGNOSIS — G20A1 Parkinson's disease without dyskinesia, without mention of fluctuations: Secondary | ICD-10-CM | POA: Diagnosis not present

## 2023-09-22 DIAGNOSIS — F324 Major depressive disorder, single episode, in partial remission: Secondary | ICD-10-CM | POA: Diagnosis not present

## 2023-09-22 DIAGNOSIS — M80051D Age-related osteoporosis with current pathological fracture, right femur, subsequent encounter for fracture with routine healing: Secondary | ICD-10-CM | POA: Diagnosis not present

## 2023-09-25 DIAGNOSIS — F0283 Dementia in other diseases classified elsewhere, unspecified severity, with mood disturbance: Secondary | ICD-10-CM | POA: Diagnosis not present

## 2023-09-25 DIAGNOSIS — L89152 Pressure ulcer of sacral region, stage 2: Secondary | ICD-10-CM | POA: Diagnosis not present

## 2023-09-25 DIAGNOSIS — M80051D Age-related osteoporosis with current pathological fracture, right femur, subsequent encounter for fracture with routine healing: Secondary | ICD-10-CM | POA: Diagnosis not present

## 2023-09-25 DIAGNOSIS — G20A1 Parkinson's disease without dyskinesia, without mention of fluctuations: Secondary | ICD-10-CM | POA: Diagnosis not present

## 2023-09-25 DIAGNOSIS — F324 Major depressive disorder, single episode, in partial remission: Secondary | ICD-10-CM | POA: Diagnosis not present

## 2023-09-25 DIAGNOSIS — G3183 Dementia with Lewy bodies: Secondary | ICD-10-CM | POA: Diagnosis not present

## 2023-09-29 ENCOUNTER — Other Ambulatory Visit: Payer: Self-pay | Admitting: Sports Medicine

## 2023-09-29 DIAGNOSIS — M7989 Other specified soft tissue disorders: Secondary | ICD-10-CM

## 2023-09-29 NOTE — Telephone Encounter (Signed)
Patient is requesting additional refills from previous refill. Medication pend and sent to PCP [name], MD

## 2023-10-14 ENCOUNTER — Ambulatory Visit: Payer: Medicare Other | Admitting: Sports Medicine

## 2023-10-29 ENCOUNTER — Ambulatory Visit: Payer: Medicare Other | Admitting: Sports Medicine

## 2023-11-04 ENCOUNTER — Ambulatory Visit (INDEPENDENT_AMBULATORY_CARE_PROVIDER_SITE_OTHER): Payer: Medicare Other

## 2023-11-04 DIAGNOSIS — I4719 Other supraventricular tachycardia: Secondary | ICD-10-CM

## 2023-11-04 DIAGNOSIS — I495 Sick sinus syndrome: Secondary | ICD-10-CM | POA: Diagnosis not present

## 2023-11-04 LAB — CUP PACEART REMOTE DEVICE CHECK
Battery Remaining Longevity: 70 mo
Battery Remaining Percentage: 55 %
Battery Voltage: 2.99 V
Brady Statistic AP VP Percent: 1 %
Brady Statistic AP VS Percent: 65 %
Brady Statistic AS VP Percent: 1 %
Brady Statistic AS VS Percent: 35 %
Brady Statistic RA Percent Paced: 64 %
Brady Statistic RV Percent Paced: 1 %
Date Time Interrogation Session: 20250204020013
Implantable Lead Connection Status: 753985
Implantable Lead Connection Status: 753985
Implantable Lead Implant Date: 20191106
Implantable Lead Implant Date: 20191111
Implantable Lead Location: 753859
Implantable Lead Location: 753860
Implantable Lead Model: 1948
Implantable Pulse Generator Implant Date: 20191106
Lead Channel Impedance Value: 390 Ohm
Lead Channel Impedance Value: 560 Ohm
Lead Channel Pacing Threshold Amplitude: 0.5 V
Lead Channel Pacing Threshold Amplitude: 0.875 V
Lead Channel Pacing Threshold Pulse Width: 0.5 ms
Lead Channel Pacing Threshold Pulse Width: 0.5 ms
Lead Channel Sensing Intrinsic Amplitude: 2.7 mV
Lead Channel Sensing Intrinsic Amplitude: 3.6 mV
Lead Channel Setting Pacing Amplitude: 1.125
Lead Channel Setting Pacing Amplitude: 1.5 V
Lead Channel Setting Pacing Pulse Width: 0.5 ms
Lead Channel Setting Sensing Sensitivity: 0.5 mV
Pulse Gen Model: 2272
Pulse Gen Serial Number: 9083517

## 2023-11-08 ENCOUNTER — Encounter: Payer: Self-pay | Admitting: Cardiovascular Disease

## 2023-11-18 ENCOUNTER — Ambulatory Visit (INDEPENDENT_AMBULATORY_CARE_PROVIDER_SITE_OTHER): Payer: Medicare Other | Admitting: Sports Medicine

## 2023-11-18 ENCOUNTER — Encounter: Payer: Self-pay | Admitting: Sports Medicine

## 2023-11-18 VITALS — BP 136/86 | HR 60 | Temp 97.7°F | Resp 16 | Ht 69.0 in | Wt 160.0 lb

## 2023-11-18 DIAGNOSIS — G20A1 Parkinson's disease without dyskinesia, without mention of fluctuations: Secondary | ICD-10-CM

## 2023-11-18 DIAGNOSIS — D649 Anemia, unspecified: Secondary | ICD-10-CM

## 2023-11-18 DIAGNOSIS — L989 Disorder of the skin and subcutaneous tissue, unspecified: Secondary | ICD-10-CM

## 2023-11-18 DIAGNOSIS — S72001S Fracture of unspecified part of neck of right femur, sequela: Secondary | ICD-10-CM

## 2023-11-18 DIAGNOSIS — M7989 Other specified soft tissue disorders: Secondary | ICD-10-CM | POA: Diagnosis not present

## 2023-11-18 DIAGNOSIS — I5032 Chronic diastolic (congestive) heart failure: Secondary | ICD-10-CM

## 2023-11-18 NOTE — Progress Notes (Unsigned)
Careteam: Patient Care Team: [name], MD as PCP - General (Internal Medicine) Croitoru, [name], MD as PCP - Cardiology (Cardiology)  PLACE OF SERVICE:  [facility]  Advanced Directive information Does Patient Have a Medical Advance Directive?: Yes, Type of Advance Directive: Healthcare Power of [place];Living will;Out of facility DNR (pink MOST or yellow form), Does patient want to make changes to medical advance directive?: No - Patient declined  Allergies  Allergen Reactions   Propofol Anaphylaxis    Chief Complaint  Patient presents with   Medical Management of Chronic Issues    2 month follow up.    Immunizations    Discuss the need for Shingrix vaccine, and Covid Booster.    Health Maintenance    Discuss the need for AWV.     Discussed the use of AI scribe software for clinical note transcription with the patient, who gave verbal consent to proceed.  History of Present Illness   Charles [name] is an 83 year old male who presents for follow-up after a right hip fracture. He is accompanied by his wife and daughter.  He was in a rehabilitation facility for two months following a right hip fracture. He had hemiarthroplasty  06/11/2023. He currently experiences no pain in the right hip and is able to bear weight on the leg. He uses a walker for mobility at home and has completed home physical therapy. He performs exercises daily, including walking and leg exercises.  He has experienced weight gain, with his weight increasing from 153 pounds in November to 160 pounds currently. His wife notes swelling in his feet . He takes furosemide 20 mg daily. No dizziness, lightheadedness, chest pain, or breathing difficulties. He is able to lay flat on the bed but uses a wedge to elevate his head slightly. No abdominal pain, nausea, vomiting, dysuria, hematuria, or changes in bowel habits. He sleeps well at night and denies mood disturbances, though he wants to stop using the  walker.  He has a history of a healed wound and dry skin on his left leg, which is being monitored. He uses mometasone cream for scaly skin on his face and scalp, which worsens if not applied consistently.     He lives in [place], about an hour from the clinic, and he is seeking a primary care provider closer to his home.         Review of Systems:  Review of Systems  Constitutional:  Negative for chills and fever.  HENT:  Negative for congestion and sore throat.   Eyes:  Negative for double vision.  Respiratory:  Negative for cough, sputum production and shortness of breath.   Cardiovascular:  Positive for leg swelling. Negative for chest pain and palpitations.  Gastrointestinal:  Negative for abdominal pain, heartburn and nausea.  Genitourinary:  Negative for dysuria, frequency and hematuria.  Musculoskeletal:  Negative for joint pain.  Neurological:  Negative for dizziness, sensory change and focal weakness.   Negative unless indicated in HPI.   Past Medical History:  Diagnosis Date   AC joint separation    Right shoulder   Acute pericarditis 08/09/2018   Admitted 11/19 for pericarditis; ? 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   St Jude Assurity Dual lead PPM   SSS (sick sinus syndrome) (HCC)    Past Surgical History:  Procedure Laterality Date   acromioclavicular separation  07/04/2016   CHEST TUBE INSERTION Left 08/05/2018   EYE SURGERY     HERNIA REPAIR     LEAD REVISION/REPAIR N/A 08/10/2018   Procedure: LEAD REVISION/REPAIR;  Surgeon: Duke Salvia, MD;  Location: [facility] INVASIVE CV LAB;  Service: Cardiovascular;  Laterality: N/A;   NM MYOVIEW LTD     PACEMAKER IMPLANT N/A  08/05/2018   Procedure: St Jude dual lead PACEMAKER IMPLANT;  Surgeon: Thurmon Fair, MD;  Location: [facility] INVASIVE CV LAB;  Service: Cardiovascular;  Laterality: N/A;   Right ingunial hernia repair     US ECHOCARDIOGRAPHY     Social History:   reports that he has never smoked. He has never used smokeless tobacco. He reports that he does not drink alcohol and does not use drugs.  Family History  Problem Relation Age of Onset   Hypertension Mother    Lung cancer Mother    Diabetes Mother    Thyroid disease Mother    Angina Father    Heart attack Father    Heart disease Brother    Diabetes Brother    Heart attack Brother    Asthma Maternal Grandmother    Stroke Maternal Grandfather    Diabetes Paternal Grandfather    Heart Problems Brother    Dementia Neg Hx     Medications: Patient's Medications  New Prescriptions   No medications on file  Previous Medications   ACETAMINOPHEN (TYLENOL) 325 MG TABLET    Take 650 mg by mouth every 6 (six) hours as needed for mild pain.   CARBIDOPA-LEVODOPA (SINEMET IR) 25-100 MG TABLET    Take 1 tablet by mouth 3 (three) times daily. (0830, 1330, & 2030)   ESCITALOPRAM (LEXAPRO) 10 MG TABLET    TAKE 1 TABLET BY MOUTH AT BEDTIME   FUROSEMIDE (LASIX) 20 MG TABLET    TAKE ONE TABLET BY MOUTH EVERY DAY   L-METHYLFOLATE-B12-B6-B2 (CEREFOLIN) 02-28-49-5 MG TABS    Take 1 tablet by mouth daily.   MELATONIN-PYRIDOXINE (MELATIN PO)    Take 3 mg by mouth at bedtime.   MIRTAZAPINE (REMERON) 15 MG TABLET    Take 1 tablet (15 mg total) by mouth at bedtime.   MOMETASONE (ELOCON) 0.1 % CREAM    Apply 1 Application topically daily. To dry red scaly areas on face   MULTIPLE VITAMIN (MULTIVITAMIN WITH MINERALS) TABS TABLET    Take 1 tablet by mouth daily.  Modified Medications   No medications on file  Discontinued Medications   No medications on file    Physical Exam: Vitals:   11/18/23 1425 11/18/23 1514  BP: (!) 144/84 136/86  Pulse: 60   Resp: 16    Temp: 97.7 F (36.5 C)   SpO2: 95%   Weight: 160 lb (72.6 kg)   Height: 5\' 9"  (1.753 m)    Body mass index is 23.63 kg/m. BP Readings from Last 3 Encounters:  11/18/23 136/86  08/12/23 122/62  06/09/23 128/78   Wt Readings from Last 3 Encounters:  11/18/23 160 lb (72.6 kg)  08/12/23 153 lb (69.4 kg)  06/09/23 141 lb (64 kg)    Physical Exam Constitutional:      Appearance: Normal appearance.  HENT:     Head: Normocephalic and atraumatic.  Cardiovascular:  Rate and Rhythm: Normal rate and regular rhythm.     Pulses: Normal pulses.     Heart sounds: Normal heart sounds.  Pulmonary:     Effort: No respiratory distress.     Breath sounds: No stridor. No wheezing or rales.  Abdominal:     General: Bowel sounds are normal. There is no distension.     Palpations: Abdomen is soft.     Tenderness: There is no abdominal tenderness. There is no right CVA tenderness or guarding.  Musculoskeletal:        General: Swelling present.     Comments: 1+ pitting oedema  Neurological:     Mental Status: He is alert. Mental status is at baseline.     Labs reviewed: Basic Metabolic Panel: Recent Labs    11/18/23 1515  NA 140  K 4.4  CL 102  CO2 29  GLUCOSE 99  BUN 18  CREATININE 1.04  CALCIUM 9.5   Liver Function Tests: No results for input(s): "AST", "ALT", "ALKPHOS", "BILITOT", "PROT", "ALBUMIN" in the last 8760 hours. No results for input(s): "LIPASE", "AMYLASE" in the last 8760 hours. No results for input(s): "AMMONIA" in the last 8760 hours. CBC: Recent Labs    08/12/23 1528 11/18/23 1515  WBC 8.7 7.6  NEUTROABS 5,046 4,302  HGB 11.8* 13.6  HCT 37.6* 42.3  MCV 90.0 85.1  PLT 297 248   Lipid Panel: No results for input(s): "CHOL", "HDL", "LDLCALC", "TRIG", "CHOLHDL", "LDLDIRECT" in the last 8760 hours. TSH: No results for input(s): "TSH" in the last 8760 hours. A1C: Lab Results  Component Value Date   HGBA1C 5.7 (H) 02/26/2023    Assessment and  Plan    Right Hip Fracture Recovering well post-rehabilitation with no reported pain. Able to bear weight and ambulate with a walker. -Continue home exercises for strength and balance. -Referral to home physical therapy            1. Swelling of lower extremity (Primary) Noted weight gain and increased leg swelling.   no orthopnea, PND lungs clear on auscultation -Continue Furosemide 20mg  daily. -Encourage elevation of legs and use of compression stockings. will check labs - Basic Metabolic Panel with eGFR  2. Anemia, unspecified type  - CBC with Differential/Platelets  3. 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 Chronic diastolic CHF (congestive heart failure) (HCC) Lungs clear  Cont with lasix Avoid salty foods  - Ambulatory referral to Home Health  5. Skin lesions Healed pressure ulcer  on his sacrum  daughter reports some dry skin patches on legs. Scalp dermatitis with poor compliance to topical treatment. -Referral to dermatology  - Ambulatory referral to Dermatology  6.  Hip fracture Rt hip hemiarthroplasty 06/11/2023 Recovering well post-rehabilitation with no reported pain. Able to bear weight and ambulate with a walker. -Continue home exercises for strength and balance. -Referral to home physical therapy   - Ambulatory referral to Home Health  Other orders - BASIC METABOLIC PANEL WITH GFR - CBC with Differential/Platelet    40  min  Total time spent for obtaining history,  performing a medically appropriate examination and evaluation, reviewing the tests,  documenting clinical information in the electronic or other health record ,care coordination (not separately reported)

## 2023-11-19 LAB — CBC WITH DIFFERENTIAL/PLATELET
Absolute Lymphocytes: 2310 {cells}/uL (ref 850–3900)
Absolute Monocytes: 684 {cells}/uL (ref 200–950)
Basophils Absolute: 122 {cells}/uL (ref 0–200)
Basophils Relative: 1.6 *Deleted
Eosinophils Absolute: 182 {cells}/uL (ref 15–500)
Eosinophils Relative: 2.4 *Deleted
HCT: 42.3 *Deleted (ref 38.5–50.0)
Hemoglobin: 13.6 g/dL (ref 13.2–17.1)
MCH: 27.4 pg (ref 27.0–33.0)
MCHC: 32.2 g/dL (ref 32.0–36.0)
MCV: 85.1 fL (ref 80.0–100.0)
MPV: 11.1 fL (ref 7.5–12.5)
Monocytes Relative: 9 *Deleted
Neutro Abs: 4302 {cells}/uL (ref 1500–7800)
Neutrophils Relative %: 56.6 *Deleted
Platelets: 248 10*3/uL (ref 140–400)
RBC: 4.97 10*6/uL (ref 4.20–5.80)
RDW: 15.7 *Deleted — ABNORMAL HIGH (ref 11.0–15.0)
Total Lymphocyte: 30.4 *Deleted
WBC: 7.6 10*3/uL (ref 3.8–10.8)

## 2023-11-19 LAB — BASIC METABOLIC PANEL WITH GFR
BUN: 18 mg/dL (ref 7–25)
CO2: 29 mmol/L (ref 20–32)
Calcium: 9.5 mg/dL (ref 8.6–10.3)
Chloride: 102 mmol/L (ref 98–110)
Creat: 1.04 mg/dL (ref 0.70–1.22)
Glucose, Bld: 99 mg/dL (ref 65–139)
Potassium: 4.4 mmol/L (ref 3.5–5.3)
Sodium: 140 mmol/L (ref 135–146)
eGFR: 72 mL/min/{1.73_m2} (ref >=60)

## 2023-12-11 NOTE — Progress Notes (Signed)
 Remote pacemaker transmission.

## 2023-12-28 ENCOUNTER — Other Ambulatory Visit: Payer: Self-pay | Admitting: Family Medicine

## 2023-12-29 NOTE — Telephone Encounter (Signed)
 Pharmacy Requested refill Pended Rx and sent to Dr. Jacquenette Shone for approval due to HIGH ALERT Warning.

## 2024-02-03 ENCOUNTER — Ambulatory Visit (INDEPENDENT_AMBULATORY_CARE_PROVIDER_SITE_OTHER): Payer: Medicare Other

## 2024-02-03 DIAGNOSIS — I495 Sick sinus syndrome: Secondary | ICD-10-CM

## 2024-02-03 LAB — CUP PACEART REMOTE DEVICE CHECK
Battery Remaining Longevity: 67 mo
Battery Remaining Percentage: 53 %
Battery Voltage: 2.99 V
Brady Statistic AP VP Percent: 1 %
Brady Statistic AP VS Percent: 70 %
Brady Statistic AS VP Percent: 1 %
Brady Statistic AS VS Percent: 30 %
Brady Statistic RA Percent Paced: 69 %
Brady Statistic RV Percent Paced: 1 %
Date Time Interrogation Session: 20250506021116
Implantable Lead Connection Status: 753985
Implantable Lead Connection Status: 753985
Implantable Lead Implant Date: 20191106
Implantable Lead Implant Date: 20191111
Implantable Lead Location: 753859
Implantable Lead Location: 753860
Implantable Lead Model: 1948
Implantable Pulse Generator Implant Date: 20191106
Lead Channel Impedance Value: 450 Ohm
Lead Channel Impedance Value: 590 Ohm
Lead Channel Pacing Threshold Amplitude: 0.5 V
Lead Channel Pacing Threshold Amplitude: 0.875 V
Lead Channel Pacing Threshold Pulse Width: 0.5 ms
Lead Channel Pacing Threshold Pulse Width: 0.5 ms
Lead Channel Sensing Intrinsic Amplitude: 2.2 mV
Lead Channel Sensing Intrinsic Amplitude: 3.7 mV
Lead Channel Setting Pacing Amplitude: 1.125
Lead Channel Setting Pacing Amplitude: 1.5 V
Lead Channel Setting Pacing Pulse Width: 0.5 ms
Lead Channel Setting Sensing Sensitivity: 0.5 mV
Pulse Gen Model: 2272
Pulse Gen Serial Number: 9083517

## 2024-02-08 ENCOUNTER — Encounter: Payer: Self-pay | Admitting: Cardiovascular Disease

## 2024-02-27 ENCOUNTER — Other Ambulatory Visit: Payer: Self-pay | Admitting: Sports Medicine

## 2024-02-27 DIAGNOSIS — M7989 Other specified soft tissue disorders: Secondary | ICD-10-CM

## 2024-03-01 ENCOUNTER — Telehealth: Admitting: Sports Medicine

## 2024-03-01 NOTE — Telephone Encounter (Signed)
 Copied from CRM [id number]. Topic: Appointments - Transfer of Care >> Mar 01, 2024  3:05 PM Hamp Levine R wrote: Pt is requesting to transfer FROM: Prashanthi Velundandi Pt is requesting to transfer TO: NP Kattie Parrot Reason for requested transfer: Location, recently moved. It is the responsibility of the team the patient would like to transfer to (NP Rakes) to reach out to the patient if for any reason this transfer is not acceptable.   Please advise if Transfer of Care is okay

## 2024-03-17 ENCOUNTER — Ambulatory Visit: Payer: Medicare Other | Admitting: Sports Medicine

## 2024-03-17 NOTE — Progress Notes (Signed)
 Remote pacemaker transmission.

## 2024-03-29 ENCOUNTER — Encounter: Payer: Self-pay | Admitting: Student in an Organized Health Care Education/Training Program

## 2024-03-29 ENCOUNTER — Ambulatory Visit (INDEPENDENT_AMBULATORY_CARE_PROVIDER_SITE_OTHER): Admitting: Student in an Organized Health Care Education/Training Program

## 2024-03-29 VITALS — BP 147/81 | HR 74 | Temp 98.2°F | Resp 15 | Ht 69.0 in | Wt 162.2 lb

## 2024-03-29 DIAGNOSIS — Z950 Presence of cardiac pacemaker: Secondary | ICD-10-CM

## 2024-03-29 DIAGNOSIS — G20A1 Parkinson's disease without dyskinesia, without mention of fluctuations: Secondary | ICD-10-CM | POA: Diagnosis not present

## 2024-03-29 DIAGNOSIS — F324 Major depressive disorder, single episode, in partial remission: Secondary | ICD-10-CM | POA: Diagnosis not present

## 2024-03-29 DIAGNOSIS — M810 Age-related osteoporosis without current pathological fracture: Secondary | ICD-10-CM | POA: Insufficient documentation

## 2024-03-29 DIAGNOSIS — I10 Essential (primary) hypertension: Secondary | ICD-10-CM | POA: Diagnosis not present

## 2024-03-29 DIAGNOSIS — H6123 Impacted cerumen, bilateral: Secondary | ICD-10-CM

## 2024-03-29 DIAGNOSIS — R011 Cardiac murmur, unspecified: Secondary | ICD-10-CM | POA: Insufficient documentation

## 2024-03-29 DIAGNOSIS — H6120 Impacted cerumen, unspecified ear: Secondary | ICD-10-CM | POA: Insufficient documentation

## 2024-03-29 NOTE — Assessment & Plan Note (Signed)
 Elevated blood pressure today.  Will need to watch this carefully.  With his advanced Parkinson's disease he is at risk for orthostatic hypotension.  I anticipate tolerating higher blood pressures to lower his risk of falls.

## 2024-03-29 NOTE — Assessment & Plan Note (Signed)
 History of a fragility fracture in the right hip that was repaired with hip replacement.  Not treated in the past with bisphosphonate for osteoporosis.  Will check a DEXA scan and I anticipate offering alendronate therapy to reduce the risk of future fragility fracture.

## 2024-03-29 NOTE — Assessment & Plan Note (Signed)
 Chronic and stable.  Associated with Parkinson's dementia.  Has been doing very well on Lexapro  and mirtazapine  for many years.  He reports satisfactory mood, no side effects, will continue these medications.

## 2024-03-29 NOTE — Assessment & Plan Note (Signed)
 Chronic and stable.  Currently using Sinemet , managed with neurology at [facility].  Their current neurologist is retiring soon and they want to transfer care closer to the [place] area.  Will put in a referral to [facility] neurology.  His symptom burden of Parkinson's is moderate, mostly impacting his memory, gait, and expressions.  Not much tremor on exam today which is great for his quality of life.

## 2024-03-29 NOTE — Assessment & Plan Note (Signed)
 Unusual sounding early systolic heart murmur, patient is asymptomatic.  History of structural heart and pericardial disease.  Last echocardiogram in our system was a limited echo in 2019.  Will recheck an echocardiogram today to look for progression of valvular disease.

## 2024-03-29 NOTE — Assessment & Plan Note (Signed)
 History of bradycardia due to sick sinus syndrome, currently with a pacemaker that is about 82 years old.  This is managed with cardiology and remote monitoring.

## 2024-03-29 NOTE — Progress Notes (Signed)
 New Patient Office Visit  Subjective    Patient ID: Charles [name], male    DOB: March 08, 1941  Age: 83 y.o. MRN: [id number]  CC:   Chief Complaint  Patient presents with   New Patient (Initial Visit)    Pt is doing well, notes no concerns, wife opal and Middle daughter Olam are present with patient today     HPI  Charles [name] presents to establish care  83 year old person here for management of Parkinson's disease.  He is accompanied by his wife and his daughter today.  Lives in [place].  Is a retired accountant's.  Diagnosed with Parkinson's disease around 2019 which was being managed at [facility].  Currently on Sinemet  and doing well with that medication, not much tremor on exam.  Parkinson disease mainly impacts his memory.  He lives at home with his wife, is independent in his activities of daily living but needs pretty consistent help with advanced activities.  No longer drives, bit of an unsteady gait send occasional falls, had a hip fracture last year that was repaired surgically.  Looking to transition more of his medical care locally and to [facility].  Currently has no concerns about his current health.  Mood has been stable, on antidepressant for a number of years.  Has some chronic lower extremity edema with some stasis changes, being managed with a dermatologist.  Had a few biopsies done recently and has been pretty slow to heal with those areas.    Outpatient Encounter Medications as of 03/29/2024  Medication Sig   carbidopa -levodopa  (SINEMET  IR) 25-100 MG tablet Take 1 tablet by mouth 3 (three) times daily. (0830, 1330, & 2030)   clobetasol (TEMOVATE) 0.05 % external solution    escitalopram  (LEXAPRO ) 10 MG tablet TAKE 1 TABLET BY MOUTH AT BEDTIME   L-Methylfolate-B12-B6-B2 (CEREFOLIN) 02-28-49-5 MG TABS Take 1 tablet by mouth daily.   mirtazapine  (REMERON ) 15 MG tablet TAKE ONE TABLET BY MOUTH AT BEDTIME   terbinafine (LAMISIL) 250 MG tablet Take 250 mg by mouth daily.    triamcinolone cream (KENALOG) 0.1 % Apply topically.   [DISCONTINUED] acetaminophen  (TYLENOL ) 325 MG tablet Take 650 mg by mouth every 6 (six) hours as needed for mild pain.   [DISCONTINUED] furosemide  (LASIX ) 20 MG tablet TAKE ONE TABLET BY MOUTH EVERY DAY   [DISCONTINUED] Melatonin-Pyridoxine (MELATIN PO) Take 3 mg by mouth at bedtime.   [DISCONTINUED] Multiple Vitamin (MULTIVITAMIN WITH MINERALS) TABS tablet Take 1 tablet by mouth daily.   [DISCONTINUED] mometasone  (ELOCON ) 0.1 % cream Apply 1 Application topically daily. To dry red scaly areas on face (Patient not taking: Reported on 03/29/2024)   No facility-administered encounter medications on file as of 03/29/2024.    Past Medical History:  Diagnosis Date   AC joint separation    Right shoulder   Acute pericarditis 08/09/2018   Admitted 11/19 for pericarditis; ? 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   Sick sinus syndrome (HCC) 07/14/2018   SSS (sick sinus syndrome) (HCC)     Past Surgical History:  Procedure Laterality Date   acromioclavicular separation  07/04/2016   CHEST TUBE INSERTION Left 08/05/2018   EYE SURGERY     HERNIA REPAIR     LEAD REVISION/REPAIR N/A 08/10/2018   Procedure: LEAD REVISION/REPAIR;  Surgeon: Fernande Elspeth BROCKS, MD;  Location: [facility] INVASIVE CV LAB;  Service: Cardiovascular;  Laterality: N/A;   NM MYOVIEW  LTD     PACEMAKER IMPLANT N/A 08/05/2018   Procedure: St Jude dual lead PACEMAKER IMPLANT;  Surgeon: Francyne Headland, MD;  Location: [facility] INVASIVE CV LAB;  Service: Cardiovascular;  Laterality: N/A;   REPLACEMENT TOTAL HIP W/  RESURFACING IMPLANTS Right 06/2023   Right  ingunial hernia repair     US  ECHOCARDIOGRAPHY      Family History  Problem Relation Age of Onset   Hypertension Mother    Lung cancer Mother    Diabetes Mother    Thyroid  disease Mother    Angina Father    Heart attack Father    Heart disease Brother    Diabetes Brother    Heart attack Brother    Asthma Maternal Grandmother    Stroke Maternal Grandfather    Diabetes Paternal Grandfather    Heart Problems Brother    Dementia Neg Hx     Social History   Socioeconomic History   Marital status: Married    Spouse name: Not on file   Number of children: 3   Years of education: Not on file   Highest education level: Associate degree: academic program  Occupational History   Not on file  Tobacco Use   Smoking status: Never   Smokeless tobacco: Never  Vaping Use   Vaping status: Never Used  Substance and Sexual Activity   Alcohol use: No   Drug use: No   Sexual activity: Not Currently  Other Topics Concern   Not on file  Social History Narrative   Lives at home with his wife   Right handed   No caffeine    Social Drivers of Corporate investment banker Strain: Not on file  Food Insecurity: Not on file  Transportation Needs: No Transportation Needs (06/24/2023)   Received from [facility]   PRAPARE - Transportation    Lack of Transportation (Medical): No    Lack of Transportation (Non-Medical): No  Physical Activity: Not on file  Stress: Not on file  Social Connections: Not on file  Intimate Partner Violence: Not on file        Objective    BP (!) 147/81   Pulse 74   Temp 98.2 F (36.8 C) (Temporal)   Resp 15   Ht 5' 9 (1.753 m)   Wt 162 lb 3.2 oz (73.6 kg)   SpO2 94%   BMI 23.95 kg/m   Physical Exam  Gen: Frail-appearing older man Eyes: Pupils are constricted bilaterally, no deficits in eye abduction Ears: Both ears impacted with wax, I removed large amounts of wax from both ears with a curette, ear canals were inflamed, some small areas  of bleeding, there is still wax adherent to the tympanic membranes, but hearing much improved after cerumen removal Neck: Normal thyroid , no adenopathy Heart: Regular, unusual sounding high-pitched early systolic murmur, sounds like a squeaking, best heard at the left upper sternal border Lungs: Unlabored, clear throughout Abd: Soft, nontender Ext: Warm, mild chronic stasis changes, trace pitting edema both ankles, varicose veins in both legs Neuro: Masked face, no tremor, delayed get up and go, hunched over wide-based gait, shuffling, unsteady getting on the table requiring 1 person assistance,  strength is normal in the upper and lower extremities, mild rigidity Psych: Appropriate mood and affect, not anxious or depressed.      Assessment & Plan:   Problem List Items Addressed This Visit       High   Pacemaker (Chronic)   History of bradycardia due to sick sinus syndrome, currently with a pacemaker that is about 82 years old.  This is managed with cardiology and remote monitoring.      Parkinson's disease (HCC) - Primary (Chronic)   Chronic and stable.  Currently using Sinemet , managed with neurology at [facility].  Their current neurologist is retiring soon and they want to transfer care closer to the [place] area.  Will put in a referral to [facility] neurology.  His symptom burden of Parkinson's is moderate, mostly impacting his memory, gait, and expressions.  Not much tremor on exam today which is great for his quality of life.      Relevant Orders   Ambulatory referral to Neurology     Medium    Depression, major, single episode, in partial remission (HCC) (Chronic)   Chronic and stable.  Associated with Parkinson's dementia.  Has been doing very well on Lexapro  and mirtazapine  for many years.  He reports satisfactory mood, no side effects, will continue these medications.      Hypertension (Chronic)   Elevated blood pressure today.  Will need to watch this carefully.   With his advanced Parkinson's disease he is at risk for orthostatic hypotension.  I anticipate tolerating higher blood pressures to lower his risk of falls.      Osteoporosis   History of a fragility fracture in the right hip that was repaired with hip replacement.  Not treated in the past with bisphosphonate for osteoporosis.  Will check a DEXA scan and I anticipate offering alendronate therapy to reduce the risk of future fragility fracture.      Relevant Orders   DG Bone Density     Low   Heart murmur   Unusual sounding early systolic heart murmur, patient is asymptomatic.  History of structural heart and pericardial disease.  Last echocardiogram in our system was a limited echo in 2019.  Will recheck an echocardiogram today to look for progression of valvular disease.      Relevant Orders   ECHOCARDIOGRAM COMPLETE   Cerumen impaction   Procedure Note: Manual Removal of Impacted Cerumen Using a Curette   Indication:  Cerumen impaction causing symptoms (e.g., hearing loss, pain, tinnitus) or preventing assessment of the ear canal and tympanic membrane.  Procedure:  Explained the procedure to the patient and informed consent was obtained  Review patient history for contraindications (e.g., nonintact tympanic membrane, history of ear surgery, anatomical abnormalities).[1]  After a position of the patient's head upright, I visualized the ear canal and cerumen using an otoscope.  I gently inserted the curette into the ear canal avoiding contact with the canal walls, and carefully scooped the cerumen, removing it in small pieces.  I reassessed the ear canal and tympanic membrane, there was no residual cerumen nor signs of trauma.  Follow-Up:  I instructed the patient to report any persistent symptoms such as pain, discharge, or hearing loss.  Schedule a follow-up appointment if necessary.        Return in about 2 months (around 05/29/2024).   Cleatus Debby Specking, MD

## 2024-03-29 NOTE — Assessment & Plan Note (Signed)
 Procedure Note: Manual Removal of Impacted Cerumen Using a Curette   Indication:  Cerumen impaction causing symptoms (e.g., hearing loss, pain, tinnitus) or preventing assessment of the ear canal and tympanic membrane.  Procedure:  Explained the procedure to the patient and informed consent was obtained  Review patient history for contraindications (e.g., nonintact tympanic membrane, history of ear surgery, anatomical abnormalities).[1]  After a position of the patient's head upright, I visualized the ear canal and cerumen using an otoscope.  I gently inserted the curette into the ear canal avoiding contact with the canal walls, and carefully scooped the cerumen, removing it in small pieces.  I reassessed the ear canal and tympanic membrane, there was no residual cerumen nor signs of trauma.  Follow-Up:  I instructed the patient to report any persistent symptoms such as pain, discharge, or hearing loss.  Schedule a follow-up appointment if necessary.

## 2024-04-16 ENCOUNTER — Other Ambulatory Visit (HOSPITAL_COMMUNITY)

## 2024-04-26 ENCOUNTER — Telehealth: Payer: Self-pay

## 2024-04-26 DIAGNOSIS — F324 Major depressive disorder, single episode, in partial remission: Secondary | ICD-10-CM

## 2024-04-26 MED ORDER — ESCITALOPRAM OXALATE 10 MG PO TABS: 10.0000 mg | ORAL_TABLET | Freq: Every day | ORAL | 3 refills | Status: AC

## 2024-04-26 NOTE — Telephone Encounter (Signed)
 Copied from CRM [id number]. Topic: Clinical - Medication Question >> Apr 26, 2024 11:59 AM Henretta I wrote: Reason for CRM: Patients wife Mrs. [name] called and was wanting to know if Dr. Jerrell wants to keep patient on escitalopram  (LEXAPRO ) 10 MG tablet  as it wasn't originally prescribed by him and if so can it be refilled. If not she would like to know what next steps to take. Patient's wife would like a call back to discuss.

## 2024-04-26 NOTE — Telephone Encounter (Signed)
 Called patients daughter and she would like Lexapro  to be refilled. I have pended medication

## 2024-04-26 NOTE — Telephone Encounter (Signed)
Please advise on message below, thank you.

## 2024-04-26 NOTE — Telephone Encounter (Signed)
 Yes, I would like him to continue taking Lexapro .  I am happy to refill if he is in need.

## 2024-04-27 ENCOUNTER — Ambulatory Visit (HOSPITAL_COMMUNITY)

## 2024-05-04 ENCOUNTER — Ambulatory Visit (INDEPENDENT_AMBULATORY_CARE_PROVIDER_SITE_OTHER): Payer: Medicare Other

## 2024-05-04 DIAGNOSIS — I495 Sick sinus syndrome: Secondary | ICD-10-CM

## 2024-05-05 LAB — CUP PACEART REMOTE DEVICE CHECK
Battery Remaining Longevity: 63 mo
Battery Remaining Percentage: 50 %
Battery Voltage: 2.99 V
Brady Statistic AP VP Percent: 1 %
Brady Statistic AP VS Percent: 72 %
Brady Statistic AS VP Percent: 1 %
Brady Statistic AS VS Percent: 28 %
Brady Statistic RA Percent Paced: 71 %
Brady Statistic RV Percent Paced: 1 %
Date Time Interrogation Session: 20250805020013
Implantable Lead Connection Status: 753985
Implantable Lead Connection Status: 753985
Implantable Lead Implant Date: 20191106
Implantable Lead Implant Date: 20191111
Implantable Lead Location: 753859
Implantable Lead Location: 753860
Implantable Lead Model: 1948
Implantable Pulse Generator Implant Date: 20191106
Lead Channel Impedance Value: 410 Ohm
Lead Channel Impedance Value: 590 Ohm
Lead Channel Pacing Threshold Amplitude: 0.5 V
Lead Channel Pacing Threshold Amplitude: 0.875 V
Lead Channel Pacing Threshold Pulse Width: 0.5 ms
Lead Channel Pacing Threshold Pulse Width: 0.5 ms
Lead Channel Sensing Intrinsic Amplitude: 2.8 mV
Lead Channel Sensing Intrinsic Amplitude: 3.7 mV
Lead Channel Setting Pacing Amplitude: 1.125
Lead Channel Setting Pacing Amplitude: 1.5 V
Lead Channel Setting Pacing Pulse Width: 0.5 ms
Lead Channel Setting Sensing Sensitivity: 0.5 mV
Pulse Gen Model: 2272
Pulse Gen Serial Number: 9083517

## 2024-05-06 ENCOUNTER — Ambulatory Visit: Payer: Self-pay | Admitting: Cardiovascular Disease

## 2024-05-07 ENCOUNTER — Ambulatory Visit (HOSPITAL_BASED_OUTPATIENT_CLINIC_OR_DEPARTMENT_OTHER)
Admission: RE | Admit: 2024-05-07 | Discharge: 2024-05-07 | Disposition: A | Discharge status: Home / Self Care | Source: Ambulatory Visit | Attending: Student in an Organized Health Care Education/Training Program | Admitting: Student in an Organized Health Care Education/Training Program

## 2024-05-07 ENCOUNTER — Ambulatory Visit (HOSPITAL_COMMUNITY)
Admission: RE | Admit: 2024-05-07 | Discharge: 2024-05-07 | Disposition: A | Discharge status: Home / Self Care | Source: Ambulatory Visit | Attending: Student in an Organized Health Care Education/Training Program | Admitting: Student in an Organized Health Care Education/Training Program

## 2024-05-07 ENCOUNTER — Ambulatory Visit: Payer: Self-pay | Admitting: Student in an Organized Health Care Education/Training Program

## 2024-05-07 DIAGNOSIS — R011 Cardiac murmur, unspecified: Secondary | ICD-10-CM | POA: Diagnosis present

## 2024-05-07 DIAGNOSIS — M810 Age-related osteoporosis without current pathological fracture: Secondary | ICD-10-CM | POA: Diagnosis present

## 2024-05-07 LAB — ECHOCARDIOGRAM COMPLETE
AR max vel: 1.06 cm2
AV Area VTI: 1.13 cm2
AV Area mean vel: 1.08 cm2
AV Mean grad: 10 mmHg
AV Peak grad: 17.5 mmHg
Ao pk vel: 2.09 m/s
Area-P 1/2: 3.91 cm2
S' Lateral: 2.5 cm

## 2024-05-07 NOTE — Progress Notes (Signed)
*  PRELIMINARY RESULTS* Echocardiogram 2D Echocardiogram has been performed.  Benard FORBES Stallion 05/07/2024, 11:35 AM

## 2024-05-11 NOTE — Progress Notes (Signed)
LVM to call office to schedule an appt.

## 2024-05-12 NOTE — Progress Notes (Signed)
 LVM to call office. Letter sent out

## 2024-05-12 NOTE — Telephone Encounter (Signed)
 Spoke with daughter, results relayed and noted this would be discussed further 06/01/24

## 2024-05-19 ENCOUNTER — Ambulatory Visit: Admitting: Sports Medicine

## 2024-06-01 ENCOUNTER — Ambulatory Visit (INDEPENDENT_AMBULATORY_CARE_PROVIDER_SITE_OTHER): Admitting: Student in an Organized Health Care Education/Training Program

## 2024-06-01 ENCOUNTER — Encounter: Payer: Self-pay | Admitting: Student in an Organized Health Care Education/Training Program

## 2024-06-01 VITALS — BP 134/69 | HR 61 | Wt 161.0 lb

## 2024-06-01 DIAGNOSIS — H6123 Impacted cerumen, bilateral: Secondary | ICD-10-CM | POA: Diagnosis not present

## 2024-06-01 DIAGNOSIS — M810 Age-related osteoporosis without current pathological fracture: Secondary | ICD-10-CM | POA: Diagnosis not present

## 2024-06-01 DIAGNOSIS — Z23 Encounter for immunization: Secondary | ICD-10-CM

## 2024-06-01 DIAGNOSIS — K5900 Constipation, unspecified: Secondary | ICD-10-CM | POA: Diagnosis not present

## 2024-06-01 DIAGNOSIS — B351 Tinea unguium: Secondary | ICD-10-CM | POA: Insufficient documentation

## 2024-06-01 MED ORDER — ALENDRONATE SODIUM 10 MG PO TABS
10.0000 mg | ORAL_TABLET | Freq: Every day | ORAL | 3 refills | Status: DC
Start: 2024-06-01 — End: 2024-08-20

## 2024-06-01 NOTE — Progress Notes (Signed)
 Established Patient Office Visit  Subjective   Patient ID: Charles [name], male    DOB: 04-Jan-1941  Age: 83 y.o. MRN: [id number]  Chief Complaint  Patient presents with   Medical Management of Chronic Issues    2 month follow up for parkinson's and discuss OA treatment       HPI  Discussed the use of AI scribe software for clinical note transcription with the patient, who gave verbal consent to proceed.  History of Present Illness Charles [name] is an 83 year old male who presents for follow-up on skin lesions and medication management. He is accompanied by his mother, who is his primary caregiver.  He visited a dermatologist who biopsied two skin lesions, one on the left and one on the right. Both lesions were benign. The lesion on the right healed well, but there is concern about the healing process due to chronic swelling, which complicates wound healing.  He is currently taking terbinafine daily for a fungal infection, which he has been managing for a while. The infection has improved, but it may take additional time before significant improvement is seen.  He has a history of osteoporosis, diagnosed after a hip fracture last year. A bone density scan on August 8 confirmed the diagnosis. He has not had a recent blood draw to check vitamin D  levels or kidney function, which are relevant to his osteoporosis management.  He experiences chronic constipation, which is a known issue with his Parkinson's disease. He uses over-the-counter Miralax and Senna to manage this condition. Constipation is a significant issue and affects his mood when it occurs.  He takes melatonin, 3 mg nightly, to aid with sleep. He also takes vitamin C  and zinc, which were initially given during a stay in a rehabilitation home to prevent skin breakdown and bed sores.  He has a history of a separated left shoulder, which causes a noticeable deformity and limits his range of motion. He has difficulty with  certain movements due to this past injury.  No recent falls, chest pain, or episodes of feeling faint. He uses a walker for mobility and gets around the house well. His mood is stable, and he denies depression.     Objective:     BP 134/69   Pulse 61   Wt 161 lb (73 kg)   SpO2 95%   BMI 23.78 kg/m    Physical Exam  Gen: Well-appearing man Neuro: Masked face, slow get up and go, hunched over gait, wide-based shuffling gait, one-person assistance to get onto the table, mild pill-rolling tremor Ears: Bilateral ears impacted with cerumen, this was removed with a curette, tympanic membranes appeared normal, mild inflammation of the ear canal Heart: Regular, 3 out of 6 early squeaking systolic murmur Lungs: Unlabored, clear throughout Ext: Warm, chronic 1+ pitting edema in both lower extremities with some chronic wounds that are healing well    Assessment & Plan:    Problem List Items Addressed This Visit       Medium    Osteoporosis - Primary (Chronic)   Osteoporosis is confirmed by a bone density scan with increased fracture risk. Order blood tests for vitamin D  level and kidney function. Prescribe Fosamax  (alendronate ) once daily. Discuss potential side effects such as upset stomach and acid reflux. Advise taking it with water and remaining upright.      Relevant Medications   alendronate  (FOSAMAX ) 10 MG tablet   Other Relevant Orders   Comprehensive metabolic panel with GFR  VITAMIN D  25 Hydroxy (Vit-D Deficiency, Fractures)     Low   Cerumen impaction   Procedure Note: Manual Removal of Impacted Cerumen Using a Curette   Indication:  Cerumen impaction causing symptoms (e.g., hearing loss, pain, tinnitus) or preventing assessment of the ear canal and tympanic membrane.  Procedure:  Explained the procedure to the patient and informed consent was obtained  Review patient history for contraindications (e.g., nonintact tympanic membrane, history of ear surgery,  anatomical abnormalities).  After a position of the patient's head upright, I visualized the ear canal and cerumen using an otoscope.  I gently inserted the curette into the ear canal avoiding contact with the canal walls, and carefully scooped the cerumen, removing it in small pieces.  I reassessed the ear canal and tympanic membrane, there was no residual cerumen nor signs of trauma.  Follow-Up:  I instructed the patient to report any persistent symptoms such as pain, discharge, or hearing loss.  Schedule a follow-up appointment if necessary.         Unprioritized   Constipation   Chronic constipation is likely related to Parkinson's disease. Recommend Miralax and Senna, each once daily.      Onychomycosis   Onychomycosis is treated with terbinafine, nearing completion with improvement. Check liver enzymes with a blood test. Advise completing terbinafine and then discontinuing.      Relevant Medications   ketoconazole (NIZORAL) 2 % shampoo   Other Visit Diagnoses       Flu vaccine need       Relevant Orders   Flu vaccine HIGH DOSE PF(Fluzone Trivalent) (Completed)       Return in about 3 months (around 08/31/2024).    Cleatus Debby Specking, MD

## 2024-06-01 NOTE — Assessment & Plan Note (Signed)
 Osteoporosis is confirmed by a bone density scan with increased fracture risk. Order blood tests for vitamin D  level and kidney function. Prescribe Fosamax  (alendronate ) once daily. Discuss potential side effects such as upset stomach and acid reflux. Advise taking it with water and remaining upright.

## 2024-06-01 NOTE — Patient Instructions (Signed)
  VISIT SUMMARY: You had a follow-up appointment to discuss your skin lesions and medication management. The dermatologist confirmed that both skin lesions were benign. We also reviewed your current medications and discussed your ongoing health issues, including osteoporosis, constipation, and a fungal infection. Additionally, we addressed your chronic lower extremity swelling and mild eczema. Your general health maintenance, including vaccinations, was also discussed.  YOUR PLAN: -OSTEOPOROSIS: Osteoporosis is a condition where bones become weak and are more likely to break. We will check your vitamin D  levels and kidney function with a blood test. You are prescribed Fosamax  (alendronate ) to be taken once daily. Be aware of potential side effects like upset stomach and acid reflux, and take it with water while remaining upright. Optional vitamin C  and zinc supplements are advised.  -CONSTIPATION: Constipation is a common issue with Parkinson's disease. Continue taking Miralax and Senna, each once daily, to manage this condition.  -IMPACTED CERUMEN: Impacted cerumen means earwax buildup that can cause hearing difficulties. The earwax was removed during your visit.  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 Currently, you have no symptoms, so no immediate intervention is needed.  -CHRONIC LOWER EXTREMITY EDEMA: Chronic lower extremity edema is swelling in the lower legs. You were previously on furosemide , but it was discontinued. We recommend using compression stockings, possibly one size larger.  -ONYCHOMYCOSIS (TOENAIL FUNGAL INFECTION): Onychomycosis is a fungal infection of the toenails. You are nearing the end of your terbinafine treatment, which has shown improvement. We will check your liver enzymes with a blood test. Complete the terbinafine treatment and then discontinue it.  -CHRONIC MILD ECZEMA: Chronic  mild eczema is a skin condition causing itchy and inflamed patches. Continue managing it with topical treatments.  -GENERAL HEALTH MAINTENANCE: We discussed your vaccinations and general health maintenance. You received the flu vaccine today. We recommend getting the COVID vaccine when available and the shingles vaccine at the pharmacy.  INSTRUCTIONS: Please follow up in three months to monitor your conditions and treatment efficacy. Today, we will draw blood to check your vitamin D  level and kidney function. Your new medication will be sent to the pharmacy for the October pill pack.

## 2024-06-01 NOTE — Assessment & Plan Note (Signed)
 Chronic constipation is likely related to Parkinson's disease. Recommend Miralax and Senna, each once daily.

## 2024-06-01 NOTE — Assessment & Plan Note (Signed)
 Procedure Note: Manual Removal of Impacted Cerumen Using a Curette   Indication:  Cerumen impaction causing symptoms (e.g., hearing loss, pain, tinnitus) or preventing assessment of the ear canal and tympanic membrane.  Procedure:  Explained the procedure to the patient and informed consent was obtained  Review patient history for contraindications (e.g., nonintact tympanic membrane, history of ear surgery, anatomical abnormalities).  After a position of the patient's head upright, I visualized the ear canal and cerumen using an otoscope.  I gently inserted the curette into the ear canal avoiding contact with the canal walls, and carefully scooped the cerumen, removing it in small pieces.  I reassessed the ear canal and tympanic membrane, there was no residual cerumen nor signs of trauma.  Follow-Up:   I instructed the patient to report any persistent symptoms such as pain, discharge, or hearing loss.  Schedule a follow-up appointment if necessary.

## 2024-06-01 NOTE — Assessment & Plan Note (Signed)
 Onychomycosis is treated with terbinafine, nearing completion with improvement. Check liver enzymes with a blood test. Advise completing terbinafine and then discontinuing.

## 2024-06-02 LAB — VITAMIN D 25 HYDROXY (VIT D DEFICIENCY, FRACTURES): VITD: 40.27 ng/mL (ref 30.00–100.00)

## 2024-06-03 ENCOUNTER — Ambulatory Visit: Payer: Self-pay | Admitting: Student in an Organized Health Care Education/Training Program

## 2024-06-18 ENCOUNTER — Telehealth: Payer: Self-pay

## 2024-06-18 NOTE — Telephone Encounter (Signed)
 It is not urgent, please book him first Thursday I have open. Thank you

## 2024-06-18 NOTE — Telephone Encounter (Signed)
 Event occurred 9/18 @ 09:58, duration 44sec, HR 157, EGM c/w atrial driven 1:1. Triggered by 20cycle 150.  Stable HR trends -   if non-actionable consider increasing HVR alerts, CV suggests considering raising alert trigger.   Forwarding to Dr. Francyne for his consideration:  Cardiac compass does show trending elevation in Atrial events and elevated V rates since mid July.   Appears to be past due for annual follow up with Dr. Francyne. Will cc to scheduling team to get patient scheduled.

## 2024-06-18 NOTE — Telephone Encounter (Signed)
 He is overdue for an office visit with me. Let's schedule that and I can change settings then [facility] please send me a reminder then). Raise alert trigger to 1 hour.

## 2024-06-21 NOTE — Telephone Encounter (Signed)
 Spoke w/ patients daughter Olam - patient is scheduled with Dr. Francyne on 12/11.

## 2024-06-25 ENCOUNTER — Other Ambulatory Visit: Payer: Self-pay | Admitting: Sports Medicine

## 2024-06-28 NOTE — Progress Notes (Signed)
 Remote PPM Transmission

## 2024-07-07 ENCOUNTER — Encounter: Admitting: Family Medicine

## 2024-07-21 ENCOUNTER — Ambulatory Visit

## 2024-07-22 ENCOUNTER — Other Ambulatory Visit: Payer: Self-pay | Admitting: Sports Medicine

## 2024-07-27 ENCOUNTER — Ambulatory Visit: Payer: Self-pay

## 2024-07-27 NOTE — Telephone Encounter (Signed)
 FYI Only or Action Required?: FYI only for provider.  Patient was last seen in primary care on 06/01/2024 by Jerrell Cleatus Ned, MD.  Called Nurse Triage reporting Abdominal Pain.  Symptoms began yesterday.  Interventions attempted: Nothing.  Symptoms are: gradually worsening.  Triage Disposition: Go to ED Now (Notify PCP)  Patient/caregiver understands and will follow disposition?: Yes  Offered 911, patient's wife states she will call one of her children to take him.   Copied from CRM [id number]. Topic: Clinical - Red Word Triage >> Jul 27, 2024  4:37 PM Ashley R wrote: Red Word that prompted transfer to Nurse Triage: Stomach not feeling well, could not get out of bed, has not eaten anything. Unable to walk. Recent medicine changes. Unable to talk loud.attempting to get liquids but unwilling. Reason for Disposition  [1] SEVERE pain AND [2] age > 60 years  Answer Assessment - Initial Assessment Questions Pt's wife called stating that yesterday he began to have a stomachache and didn't feel well. He told her he couldn't get up by himself. His son came over and helped him to the bathroom. She states his legs are still really weak, he's not eating, minimally drinking. She states he will not eat states his stomach feels full    1. LOCATION: Where does it hurt?      Stomach   3. ONSET: When did the pain begin? (Minutes, hours or days ago)      Yesterday  4. SUDDEN: Gradual or sudden onset?     Gradual but suddenly got worse in the night 5. PATTERN Does the pain come and go, or is it constant?     constant 6. SEVERITY: How bad is the pain?  (e.g., Scale 1-10; mild, moderate, or severe)      7. RECURRENT SYMPTOM: Have you ever had this type of stomach pain before? If Yes, ask: When was the last time? and What happened that time?      unknown 8. CAUSE: What do you think is causing the stomach pain? (e.g., gallstones, recent abdominal surgery)     unknown 9.  RELIEVING/AGGRAVATING FACTORS: What makes it better or worse? (e.g., antacids, bending or twisting motion, bowel movement)     no 10. OTHER SYMPTOMS: Do you have any other symptoms? (e.g., back pain, diarrhea, fever, urination pain, vomiting)       Weak, feels like stomach is fill  Protocols used: Abdominal Pain - Male-A-AH

## 2024-07-28 ENCOUNTER — Other Ambulatory Visit: Payer: Self-pay | Admitting: Sports Medicine

## 2024-07-28 NOTE — Telephone Encounter (Unsigned)
 Copied from CRM [id number]. Topic: General - Other >> Jul 28, 2024  9:34 AM Alfonso HERO wrote: Reason for CRM: patients wife calling back stated the phone disconnected durning the call. She stated you can call back or send a message. They are going to wait it out to see what the outcome is.

## 2024-07-28 NOTE — Telephone Encounter (Signed)
 Not our patient. Patient needs medication refilled. Sent to patients pcp. Jerrell Cleatus Ned, MD for further review.

## 2024-07-28 NOTE — Telephone Encounter (Signed)
 Called patient and daughter Olam answered. They decided not to go to hospital if he was fine through the night. Daughter has not talked to him as of yet. She thinks he ate something that did not agree with him. There is a caregiver coming at 10am to check on him.   While on the phone; Olam called her dad. He is doing fine. BP returned to normal range last night. Has not checked BP this morning. I offered an appointment and line was disconnected. I tried to call back and could not get through.  If patients daughter or wife calls back, please schedule appt

## 2024-07-29 NOTE — Telephone Encounter (Signed)
 I tried to call the patient and check to see how he is feeling.  I left a voicemail.

## 2024-07-30 NOTE — Telephone Encounter (Signed)
 I tried to call patient to check on him. Mailbox was full. Unable to leave vm to return call

## 2024-08-02 LAB — CUP PACEART REMOTE DEVICE CHECK
Battery Remaining Longevity: 61 mo
Battery Remaining Percentage: 48 %
Battery Voltage: 2.98 V
Brady Statistic AP VP Percent: 1 %
Brady Statistic AP VS Percent: 71 %
Brady Statistic AS VP Percent: 1 %
Brady Statistic AS VS Percent: 28 %
Brady Statistic RA Percent Paced: 70 %
Brady Statistic RV Percent Paced: 1 %
Date Time Interrogation Session: 20251103010014
Implantable Lead Connection Status: 753985
Implantable Lead Connection Status: 753985
Implantable Lead Implant Date: 20191106
Implantable Lead Implant Date: 20191111
Implantable Lead Location: 753859
Implantable Lead Location: 753860
Implantable Lead Model: 1948
Implantable Pulse Generator Implant Date: 20191106
Lead Channel Impedance Value: 430 Ohm
Lead Channel Impedance Value: 590 Ohm
Lead Channel Pacing Threshold Amplitude: 0.5 V
Lead Channel Pacing Threshold Amplitude: 0.875 V
Lead Channel Pacing Threshold Pulse Width: 0.5 ms
Lead Channel Pacing Threshold Pulse Width: 0.5 ms
Lead Channel Sensing Intrinsic Amplitude: 1.3 mV
Lead Channel Sensing Intrinsic Amplitude: 3.2 mV
Lead Channel Setting Pacing Amplitude: 1.125
Lead Channel Setting Pacing Amplitude: 1.5 V
Lead Channel Setting Pacing Pulse Width: 0.5 ms
Lead Channel Setting Sensing Sensitivity: 0.5 mV
Pulse Gen Model: 2272
Pulse Gen Serial Number: 9083517

## 2024-08-03 ENCOUNTER — Ambulatory Visit: Payer: Medicare Other

## 2024-08-03 DIAGNOSIS — I495 Sick sinus syndrome: Secondary | ICD-10-CM

## 2024-08-06 NOTE — Progress Notes (Signed)
 Remote PPM Transmission

## 2024-08-07 ENCOUNTER — Ambulatory Visit: Payer: Self-pay | Admitting: Cardiovascular Disease

## 2024-08-18 ENCOUNTER — Ambulatory Visit: Payer: Self-pay

## 2024-08-18 NOTE — Telephone Encounter (Signed)
 Appt on 08/20/24

## 2024-08-18 NOTE — Telephone Encounter (Signed)
 FYI Only or Action Required?: FYI only for provider: appointment scheduled on 08/20/2024 at 4 PM.  Patient was last seen in primary care on 06/01/2024 by Jerrell Cleatus Ned, MD.  Called Nurse Triage reporting Urinary Incontinence.  Symptoms began several weeks ago.  Interventions attempted: Rest, hydration, or home remedies.  Symptoms are: unchanged.  Triage Disposition: See Physician Within 24 Hours-family requested appointment on Friday per daughter's availability.   Patient/caregiver understands and will follow disposition?: Yes  Copied from CRM [id number]. Topic: Clinical - Red Word Triage >> Aug 18, 2024  9:20 AM Victoria A wrote: [facility] that prompted transfer to Nurse Triage: Patient's daughter called Olam Galas said patient has Parkinsons Disease and has been wetting the bed more often. Daughter believes patient is having pain but unable to convey, Reason for Disposition  [1] Can't control passage of urine (i.e., urinary incontinence) AND [2] new-onset (< 2 weeks) or worsening  Answer Assessment - Initial Assessment Questions Patient with hx of Parkinson's Disease-urinary incontinence started three weeks ago. Home health RN told family patient should be evaluated for possible UTI. Daughter called to get patient scheduled for an appointment with PCP. States family is going to get patient to a Urgent Care to get his urine tested today. Requesting a call back from office in regards to transportation options for patient.   1. SYMPTOM: What's the main symptom you're concerned about? (e.g., frequency, incontinence)     incontinence 2. ONSET: When did the  incontinence  start?     Started three weeks ago 3. PAIN: Is there any pain? If Yes, ask: How bad is it? (Scale: 1-10; mild, moderate, severe)     Daughter is thinking he is having pain-states patient looks uncomfortable.  4. CAUSE: What do you think is causing the symptoms?     Concerned for UTI 5. OTHER SYMPTOMS: Do  you have any other symptoms? (e.g., blood in urine, fever, flank pain, pain with urination)  Protocols used: Urinary Symptoms-A-AH

## 2024-08-20 ENCOUNTER — Encounter: Payer: Self-pay | Admitting: Student in an Organized Health Care Education/Training Program

## 2024-08-20 ENCOUNTER — Ambulatory Visit (INDEPENDENT_AMBULATORY_CARE_PROVIDER_SITE_OTHER): Admitting: Student in an Organized Health Care Education/Training Program

## 2024-08-20 VITALS — BP 174/85 | HR 81

## 2024-08-20 DIAGNOSIS — M810 Age-related osteoporosis without current pathological fracture: Secondary | ICD-10-CM | POA: Diagnosis not present

## 2024-08-20 DIAGNOSIS — R3914 Feeling of incomplete bladder emptying: Secondary | ICD-10-CM

## 2024-08-20 DIAGNOSIS — N401 Enlarged prostate with lower urinary tract symptoms: Secondary | ICD-10-CM

## 2024-08-20 DIAGNOSIS — G20A1 Parkinson's disease without dyskinesia, without mention of fluctuations: Secondary | ICD-10-CM

## 2024-08-20 MED ORDER — TAMSULOSIN HCL 0.4 MG PO CAPS: 0.4000 mg | ORAL_CAPSULE | Freq: Every day | ORAL | 3 refills | Status: AC

## 2024-08-20 NOTE — Assessment & Plan Note (Signed)
 Lower urinary tract symptoms over the last 2 weeks seem most consistent with incomplete bladder emptying.  He had a urinalysis at urgent care recently that was reassuring.  He has no symptoms of dysuria or lower abdominal pain.  On ultrasound his bladder is full with no tenderness.  The prostate is upper limit of normal in size, measuring about 4 cm in diameter.  There is some protrusion into the bladder.  He has prior PSA testing that was normal as of 2020.  Given his advanced age and advanced Parkinson's I do not think we need to do further PSA testing.  Rather I recommended supportive treatment by adding tamsulosin  to manage the lower urinary tract symptoms.  We talked about the increased risk of orthostatic hypotension given his Parkinson's.  He has great family support.

## 2024-08-20 NOTE — Progress Notes (Signed)
 Acute Office Visit  Patient ID: ROSEVELT LUU, male    DOB: September 21, 1941, 83 y.o.   MRN: [id number]  PCP: Jerrell Cleatus Ned, MD  Chief Complaint  Patient presents with   Urinary Frequency    Patient was seen in ER thinking is prostate complications  Has stopped taking bone density medication, makes stomach full and very uncomfortable. Stopped the medication 3 weeks ago.      Subjective:     HPI  Discussed the use of AI scribe software for clinical note transcription with the patient, who gave verbal consent to proceed.  History of Present Illness KAL CHAIT is an 83 year old male with prostate enlargement who presents with urinary difficulties. He is accompanied by his family.  He has been experiencing urinary difficulties for the past two to three weeks, including difficulty urinating and incomplete bladder emptying. A recent urine test at urgent care showed no bacterial infection. No pain is associated with urination.  He had his prostate checked a long time ago. His family suspects that the prostate may have grown larger, contributing to his urinary symptoms. He has not had any recent falls and denies any pain.  He discontinued alendronate  due to severe stomach discomfort and reflux. The medication was causing significant discomfort, leading to the decision to stop it.  He manages chronic constipation with Miralax and Senna, aiming for one bowel movement per day. His last bowel movement was yesterday. Managing constipation is important to prevent bladder irritation.  He has been experiencing some weakness and has been walking slower than usual, not engaging in his usual walking routine. He has had physical therapy in the past, particularly after hip replacement surgery in September 2024, but has not had therapy since then.      Objective:    BP (!) 174/85   Pulse 81   Physical Exam  Gen: Frail appearing man Heart: Regular, 3 out of 6 early systolic murmur  best heard at the right upper sternal border Abd: Soft, nontender, no tenderness to palpation over the bladder Ext: Warm, no edema  POCUS: Completed because of lower urinary tract symptoms.  The bladder is mildly distended, diffuse bowel gas pattern is partially obstructing, prostate measures 4 cm in diameter and has mild protrusion into the bladder.     Assessment & Plan:   Problem List Items Addressed This Visit       High   Parkinson's disease (HCC) (Chronic)   Chronic and stable.  Pretty high symptom burden, unsteady wide-based gait today.  Using a walker and ambulates very slowly.  Pretty much homebound at this point.  Doing well on Sinemet .  We talked about other complications including constipation and I recommended a strong bowel regimen.  He has a lot of physical deconditioning, more unstable gait than our last visit.  Is been over a year since he did physical therapy after the hip fracture in 2024.  Would like to do another course of home-based physical therapy to work on gait training and hopefully reduce his risk of falls.      Relevant Orders   Ambulatory referral to Home Health     Medium    Osteoporosis (Chronic)   Fragility fracture in September 2024, we tried to start osteoporosis treatment with alendronate , tolerated this pretty well for a few months but recently had some issues with upset stomach and reflux symptoms.  He discontinued using the alendronate  and is feeling better.  Given his balance issues, he  is getting continue to be at increased risk for falls and fracture.  Would like to talk to him in the future perhaps about trying Prolia as an alternative treatment for osteoporosis.      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  He had a urinalysis at urgent care recently that was reassuring.  He has no symptoms of dysuria  or lower abdominal pain.  On ultrasound his bladder is full with no tenderness.  The prostate is upper limit of normal in size, measuring about 4 cm in diameter.  There is some protrusion into the bladder.  He has prior PSA testing that was normal as of 2020.  Given his advanced age and advanced Parkinson's I do not think we need to do further PSA testing.  Rather I recommended supportive treatment by adding tamsulosin  to manage the lower urinary tract symptoms.  We talked about the increased risk of orthostatic hypotension given his Parkinson's.  He has great family support.      Relevant Medications   tamsulosin  (FLOMAX ) 0.4 MG CAPS capsule    Meds ordered this encounter  Medications   tamsulosin  (FLOMAX ) 0.4 MG CAPS capsule    Sig: Take 1 capsule (0.4 mg total) by mouth daily.    Dispense:  30 capsule    Refill:  3    No follow-ups on file.  Cleatus Debby Specking, MD [facility]

## 2024-08-20 NOTE — Patient Instructions (Signed)
  VISIT SUMMARY: During your visit, we discussed your recent urinary difficulties, chronic constipation, muscle weakness, and the discontinuation of your osteoporosis medication. We have developed a plan to address each of these issues.  YOUR PLAN: -BENIGN PROSTATIC HYPERPLASIA WITH LOWER URINARY TRACT SYMPTOMS: This condition means that your prostate is enlarged, which is causing difficulty in urinating and incomplete bladder emptying. We have prescribed Flomax  (tamsulosin ) to help with these symptoms. Be aware that this medication can cause lightheadedness, especially because of your Parkinson's disease. If you feel lightheaded, sit down immediately and stop the medication if it becomes severe. Your prescription has been sent to [facility] pharmacy in [place].  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 You stopped taking alendronate  because it caused severe stomach discomfort. We will consider a new medication for your osteoporosis once your urinary symptoms are under control.  -CHRONIC CONSTIPATION: Chronic constipation means you have difficulty having regular bowel movements. This can irritate your bladder, so it's important to manage it. Continue taking Miralax once daily, and you can increase it to twice daily if needed. Also, take Senna once daily, increasing to twice daily if necessary, to ensure you have one bowel movement per day.  -MUSCLE WEAKNESS AND DECONDITIONING: You have been experiencing muscle weakness and are walking slower than usual. We have arranged for home physical therapy to help improve your strength and provide you with exercises you can do at home.  INSTRUCTIONS: Please follow up with us  after you have started the Flomax  to discuss how you are feeling and to reassess your urinary symptoms. Continue managing your constipation as advised and start your home  physical therapy exercises. If you experience severe lightheadedness or any other concerning symptoms, contact us  immediately.

## 2024-08-20 NOTE — Assessment & Plan Note (Signed)
 Fragility fracture in September 2024, we tried to start osteoporosis treatment with alendronate , tolerated this pretty well for a few months but recently had some issues with upset stomach and reflux symptoms.  He discontinued using the alendronate  and is feeling better.  Given his balance issues, he is getting continue to be at increased risk for falls and fracture.  Would like to talk to him in the future perhaps about trying Prolia as an alternative treatment for osteoporosis.

## 2024-08-20 NOTE — Assessment & Plan Note (Signed)
 Chronic and stable.  Pretty high symptom burden, unsteady wide-based gait today.  Using a walker and ambulates very slowly.  Pretty much homebound at this point.  Doing well on Sinemet .  We talked about other complications including constipation and I recommended a strong bowel regimen.  He has a lot of physical deconditioning, more unstable gait than our last visit.  Is been over a year since he did physical therapy after the hip fracture in 2024.  Would like to do another course of home-based physical therapy to work on gait training and hopefully reduce his risk of falls.

## 2024-08-25 ENCOUNTER — Encounter: Payer: Self-pay | Admitting: Student in an Organized Health Care Education/Training Program

## 2024-08-25 NOTE — Telephone Encounter (Signed)
 Patients daughter wanted to ask if patient can stop taking melatonin at night due to he can not get awake enough to get up to go to the bathroom.

## 2024-08-31 ENCOUNTER — Ambulatory Visit: Admitting: Student in an Organized Health Care Education/Training Program

## 2024-09-08 MED ORDER — MIRABEGRON ER 25 MG PO TB24: 25.0000 mg | ORAL_TABLET | Freq: Every day | ORAL | 2 refills | Status: AC

## 2024-09-08 NOTE — Telephone Encounter (Signed)
 I spoke with the patient's daughter by phone. We decided to discontinue mirtazapine  due to somnolence he is having in the morning. For the nocturia, we talked about lifestyle modifications like full emptying the bladder before bed, and no oral fluids 2 hours before bed.  We decided to continue the tamsulosin  and add Myrbetriq for possible component of overactive bladder.  He has follow-up with me in 3 weeks.

## 2024-09-08 NOTE — Telephone Encounter (Signed)
 Patients daughter has concerns with patient still wetting the bed at night and being very lethrgic and hard to wake up in the mornings

## 2024-09-09 ENCOUNTER — Telehealth: Payer: Self-pay

## 2024-09-09 ENCOUNTER — Encounter: Admitting: Cardiovascular Disease

## 2024-09-09 NOTE — Telephone Encounter (Signed)
 Copied from CRM #[id number]. Topic: General - Other >> Sep 09, 2024  3:07 PM Mercedes MATSU wrote: Reason for CRM: Patient missed his Physical Therapy appointment. No call back is needed. Physical therapist home nurse just wanted to inform Dr. Jerrell.

## 2024-09-10 ENCOUNTER — Encounter: Payer: Self-pay | Admitting: Cardiovascular Disease

## 2024-09-10 ENCOUNTER — Ambulatory Visit: Attending: Cardiovascular Disease | Admitting: Cardiovascular Disease

## 2024-09-10 VITALS — BP 150/78 | HR 68 | Ht 69.0 in | Wt 155.0 lb

## 2024-09-10 DIAGNOSIS — I951 Orthostatic hypotension: Secondary | ICD-10-CM

## 2024-09-10 DIAGNOSIS — I350 Nonrheumatic aortic (valve) stenosis: Secondary | ICD-10-CM | POA: Diagnosis present

## 2024-09-10 DIAGNOSIS — I10 Essential (primary) hypertension: Secondary | ICD-10-CM

## 2024-09-10 DIAGNOSIS — G20A1 Parkinson's disease without dyskinesia, without mention of fluctuations: Secondary | ICD-10-CM | POA: Diagnosis present

## 2024-09-10 DIAGNOSIS — I499 Cardiac arrhythmia, unspecified: Secondary | ICD-10-CM

## 2024-09-10 DIAGNOSIS — I4719 Other supraventricular tachycardia: Secondary | ICD-10-CM

## 2024-09-10 DIAGNOSIS — I495 Sick sinus syndrome: Secondary | ICD-10-CM

## 2024-09-10 DIAGNOSIS — Z950 Presence of cardiac pacemaker: Secondary | ICD-10-CM

## 2024-09-10 NOTE — Patient Instructions (Signed)

## 2024-09-10 NOTE — Progress Notes (Signed)
 Cardiology Office Note:    Date:  09/10/2024   ID:  [name], DOB 1941-08-25, MRN [id number]  PCP:  Jerrell Cleatus Ned, MD  Cardiologist:  New Electrophysiologist:  None   Referring MD: Jerrell Cleatus Ned, MD   Chief Complaint  Patient presents with   Pacemaker Check     History of Present Illness:    Charles [name] is a 83 y.o. male with a hx of  dementia, hypertension, history of diastolic dysfunction and aortic insufficiency, Parkinson's and sinus node dysfunction with symptomatic bradycardia.  His wife Belva usually accompanies him, but she is currently hospitalized Jacory is here with his daughter, Olam and her husband.    He is doing well from a cardiac point of view.  Weight loss has stabilized and he is actually gained back a little weight so now his BMI is around 23.  He remains sedentary.  He denies shortness of breath, chest pain, palpitations, dizziness or syncope.  He has not had problems with severe ankle swelling just some mild edema towards the end of the day.  His blood pressure today is borderline high, but acceptable considering his tendency to have orthostatic hypotension.  His daughter reports that at home his blood pressure is usually lower than this, typically in the 130s.  Pacemaker interrogation shows normal device function.  Estimated generator longevity is 10-11 years.  Presenting rhythm is normal sinus rhythm.  He has 69% atrial pacing and less than 1% ventricular pacing.  He has occasional episodes of sustained but brief paroxysmal atrial tachycardia, usually with 1: 1 conduction.  The longest episode is only 12 minutes in duration.  In the first 6 months of the year he did not have much in the way of atrial tachycardia but this has steadily increased towards the end of the year so now he has a cumulative burden of roughly 1 hour every week in the last 3 months.  The episodes of atrial tachycardia seem to start in June, slowly becoming more  prevalent since then.  After the last adjustment in his PVARP he has not had any new episodes of pacemaker mediated tachycardia.  All lead parameters appear stable and in normal range.  Cardiogram performed 05/07/2024 shows normal LVEF 60 to 65% with mild diastolic dysfunction and moderate aortic stenosis (paradoxical low-flow low gradient (dimensionless index 0.36, stroke-volume index 29, mean gradient only 10 mmHg).  He underwent implantation of a dual-chamber pacemaker (St Jude Assurity) in November 2019, complicated by lead perforation with pericarditis and pneumothorax.  However, he has recovered well from that procedure.  He seems to be more active and more interactive according to his family ever since the pacemaker was implanted.     Past Medical History:  Diagnosis Date   AC joint separation    Right shoulder   Acute pericarditis 08/09/2018   Admitted 11/19 for pericarditis; ? 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   Sick sinus syndrome (HCC) 07/14/2018   SSS (sick sinus syndrome) (HCC)     Past Surgical History:  Procedure Laterality Date   acromioclavicular separation  07/04/2016   CHEST TUBE INSERTION Left 08/05/2018   EYE SURGERY     HERNIA REPAIR     LEAD REVISION/REPAIR N/A 08/10/2018   Procedure: LEAD REVISION/REPAIR;  Surgeon: Fernande Elspeth BROCKS, MD;  Location: [facility] INVASIVE CV LAB;  Service: Cardiovascular;  Laterality: N/A;   NM MYOVIEW  LTD     PACEMAKER IMPLANT N/A 08/05/2018   Procedure: St Jude dual lead PACEMAKER IMPLANT;  Surgeon: Francyne Headland, MD;  Location: [facility] INVASIVE CV LAB;  Service: Cardiovascular;  Laterality: N/A;   REPLACEMENT  TOTAL HIP W/  RESURFACING IMPLANTS Right 06/2023   Right ingunial hernia repair     US  ECHOCARDIOGRAPHY      Current Medications: Current Meds  Medication Sig   carbidopa -levodopa  (SINEMET  IR) 25-100 MG tablet Take 1 tablet by mouth 3 (three) times daily. (0830, 1330, & 2030)   clobetasol (TEMOVATE) 0.05 % external solution    escitalopram  (LEXAPRO ) 10 MG tablet Take 1 tablet (10 mg total) by mouth at bedtime.   ketoconazole (NIZORAL) 2 % shampoo Apply topically.   mirabegron ER (MYRBETRIQ) 25 MG TB24 tablet Take 1 tablet (25 mg total) by mouth daily.   MIRTAZAPINE  PO Take by mouth.   tamsulosin  (FLOMAX ) 0.4 MG CAPS capsule Take 1 capsule (0.4 mg total) by mouth daily.   triamcinolone cream (KENALOG) 0.1 % Apply topically.     Allergies:   Propofol   Social History   Socioeconomic History   Marital status: Married    Spouse name: Not on file   Number of children: 3   Years of education: Not on file   Highest education level: Associate degree: academic program  Occupational History   Not on file  Tobacco Use   Smoking status: Never   Smokeless tobacco: Never  Vaping Use   Vaping status: Never Used  Substance and Sexual Activity   Alcohol use: No   Drug use: No   Sexual activity: Not Currently  Other Topics Concern   Not on file  Social History Narrative   Lives at home with his wife   Right handed   No caffeine    Social Drivers of Health   Tobacco Use: Low Risk (09/10/2024)   Patient History    Smoking Tobacco Use: Never    Smokeless Tobacco Use: Never    Passive Exposure: Not on file  Financial Resource Strain: Not on file  Food Insecurity: Not on file  Transportation Needs: No Transportation Needs (06/24/2023)   Received from [facility]   PRAPARE - Transportation    Lack of Transportation (Medical): No    Lack of Transportation (Non-Medical): No  Physical Activity: Not on file  Stress: Not on file  Social Connections: Not on file  Depression  (PHQ2-9): Medium Risk (03/29/2024)   Depression (PHQ2-9)    PHQ-2 Score: 8  Alcohol Screen: Not on file  Housing: Not on file  Utilities: Not on file  Health Literacy: Medium Risk (06/24/2023)   [facility] from [facility]    How often do you need to have someone help you when you read instructions, pamphlets, or other written material from your doctor or pharmacy?: Sometimes     Family History: The patient's family history includes Angina in his father; Asthma in his maternal grandmother; Diabetes in his brother, mother, and paternal grandfather; Heart Problems in his brother; Heart attack in his brother and father; Heart disease in his brother; Hypertension in his mother; Lung cancer in his mother;  Stroke in his maternal grandfather; Thyroid  disease in his mother. There is no history of Dementia.  ROS:   Please see the history of present illness.   All other systems are reviewed and are negative.   EKGs/Labs/Other Studies Reviewed:     EKG:  EKG Interpretation Date/Time:  Friday September 10 2024 09:02:58 EST Ventricular Rate:  68 PR Interval:  146 QRS Duration:  102 QT Interval:  376 QTC Calculation: 399 R Axis:   -3  Text Interpretation: Normal sinus rhythm with sinus arrhythmia Moderate voltage criteria for LVH, may be normal variant ( R in aVL , Cornell product ) Nonspecific T wave abnormality When compared with ECG of 17-Apr-2023 10:12, Sinus rhythm has replaced Electronic atrial pacemaker Confirmed by Danasha Melman [phone]) on 09/10/2024 9:07:27 AM        Recent Labs: 11/18/2023: BUN 18; Creat 1.04; Potassium 4.4; Sodium 140  Recent Lipid Panel    Component Value Date/Time   CHOL 222 (H) 02/24/2019 0902   TRIG 88 02/24/2019 0902   HDL 118 02/24/2019 0902   CHOLHDL 1.9 02/24/2019 0902   LDLCALC 86 02/24/2019 0902    Physical Exam:    VS:  BP (!) 150/78 (BP Location: Left Arm, Patient Position: Sitting, Cuff Size: Normal)   Pulse 68   Ht 5'  9 (1.753 m)   Wt 155 lb (70.3 kg)   SpO2 97%   BMI 22.89 kg/m     Wt Readings from Last 3 Encounters:  09/10/24 155 lb (70.3 kg)  06/01/24 161 lb (73 kg)  03/29/24 162 lb 3.2 oz (73.6 kg)      General: Alert, oriented x3, no distress, thin. Head: no evidence of trauma, PERRL, EOMI, no exophtalmos or lid lag, no myxedema, no xanthelasma; normal ears, nose and oropharynx Neck: normal jugular venous pulsations and no hepatojugular reflux; brisk carotid pulses without delay and no carotid bruits Chest: clear to auscultation, no signs of consolidation by percussion or palpation, normal fremitus, symmetrical and full respiratory excursions Cardiovascular: normal position and quality of the apical impulse, regular rhythm, normal first and second heart sounds, 2/6 systolic murmur at the right lower sternal border, no diastolic murmurs, rubs or gallops Abdomen: no tenderness or distention, no masses by palpation, no abnormal pulsatility or arterial bruits, normal bowel sounds, no hepatosplenomegaly Extremities: no clubbing, cyanosis or edema; 2+ radial, ulnar and brachial pulses bilaterally; 2+ right femoral, posterior tibial and dorsalis pedis pulses; 2+ left femoral, posterior tibial and dorsalis pedis pulses; no subclavian or femoral bruits Neurological: grossly nonfocal Psych: Rather disengaged, but does answer questions      ASSESSMENT:    1. SSS (sick sinus syndrome) (HCC)   2. Aortic valve stenosis, nonrheumatic   3. Pacemaker   4. PMT (pacemaker-mediated tachycardia)   5. PAT (paroxysmal atrial tachycardia)   6. Essential hypertension   7. Orthostatic hypotension   8. Parkinson's disease without dyskinesia or fluctuating manifestations (HCC)    PLAN:    In order of problems listed above:  Sinus bradycardia/SSS: Even though he has rate response symptoms are on, he has blunted heart rate histogram since he is very sedentary. AS: Has a trileaflet aortic valve with probably  moderate stenosis.  Even though the mean gradient is quite low at only 10 mmHg he does have features of paradoxical low-flow low gradient AS.  Definitely not yet in severe aortic stenosis range and would anticipate at least a few years until that happens.  Due to his comorbid conditions I  do not think he will ever be a candidate for aortic valve replacement, not even TAVR.  Consider repeating an echocardiogram in 24 months. PPM: Normal device function.  No need for ventricular pacing.  Continue remote downloads every 3 months. PMT: These events have not been a problem recently.  PVARP is at maximum. PAT: The episodes of atrial tachycardia have become more frequent but remained quite brief, maximum 12 minutes in duration.  He has not had atrial fibrillation. HTN: Adequate control for him considering his comorbid conditions.  We had to stop his antihypertensive medications since he had symptomatic orthostatic hypotension.  This is likely due to his Parkinson's disease and the Sinemet .  Tolerate systolic blood pressure up to [id number]. CHF: No obvious symptoms of left heart failure and rare mild ankle swelling.  Avoiding diuretics due to orthostatic hypotension. Mild protein calorie malnutrition: This has improved and his BMI is now up to almost 23. Parkinson's disease/dementia: Seems to have stable, moderately impaired cognitive function.  The progression of his dementia.  To stabilize once he was on appropriate treatment for Parkinson's disease.  The dementia is not progressing rapidly as was initially feared, but obviously he still has memory issues.    Medication Adjustments/Labs and Tests Ordered: Current medicines are reviewed at length with the patient today.  Concerns regarding medicines are outlined above.  Orders Placed This Encounter  Procedures   EKG 12-Lead   No orders of the defined types were placed in this encounter.   Patient Instructions  Medication Instructions:  No changes *If  you need a refill on your cardiac medications before your next appointment, please call your pharmacy*  Lab Work: None ordered If you have labs (blood work) drawn today and your tests are completely normal, you will receive your results only by: MyChart Message (if you have MyChart) OR A paper copy in the mail If you have any lab test that is abnormal or we need to change your treatment, we will call you to review the results.  Testing/Procedures: None ordered  Follow-Up: At [facility], you and your health needs are our priority.  As part of our continuing mission to provide you with exceptional heart care, our providers are all part of one team.  This team includes your primary Cardiologist (physician) and Advanced Practice Providers or APPs (Physician Assistants and Nurse Practitioners) who all work together to provide you with the care you need, when you need it.  Your next appointment:   1 year(s)  Provider:   Jerel Balding, MD    We recommend signing up for the patient portal called MyChart.  Sign up information is provided on this After Visit Summary.  MyChart is used to connect with patients for Virtual Visits (Telemedicine).  Patients are able to view lab/test results, encounter notes, upcoming appointments, etc.  Non-urgent messages can be sent to your provider as well.   To learn more about what you can do with MyChart, go to [email or web address].      Signed, Jerel Balding, MD  09/10/2024 6:10 PM    [facility]

## 2024-09-15 ENCOUNTER — Telehealth: Payer: Self-pay

## 2024-09-15 NOTE — Telephone Encounter (Signed)
 Pt called to be advised on if there was a medication he could take to stop him from urinating at night pt was advised he is not a pt at our office so I am not at liberty to advise him pt was however recommended to f/u w/ his PCP to obtain a referral to our office

## 2024-10-01 ENCOUNTER — Ambulatory Visit: Admitting: Student in an Organized Health Care Education/Training Program

## 2024-10-01 ENCOUNTER — Encounter: Payer: Self-pay | Admitting: Student in an Organized Health Care Education/Training Program

## 2024-10-01 VITALS — BP 128/46 | HR 62 | Temp 97.7°F | Ht 69.0 in | Wt 158.0 lb

## 2024-10-01 DIAGNOSIS — F324 Major depressive disorder, single episode, in partial remission: Secondary | ICD-10-CM | POA: Diagnosis not present

## 2024-10-01 DIAGNOSIS — G20A1 Parkinson's disease without dyskinesia, without mention of fluctuations: Secondary | ICD-10-CM | POA: Diagnosis not present

## 2024-10-01 DIAGNOSIS — R3914 Feeling of incomplete bladder emptying: Secondary | ICD-10-CM | POA: Diagnosis not present

## 2024-10-01 DIAGNOSIS — N401 Enlarged prostate with lower urinary tract symptoms: Secondary | ICD-10-CM

## 2024-10-01 MED ORDER — MIRTAZAPINE 15 MG PO TABS: 7.5000 mg | ORAL_TABLET | Freq: Every day | ORAL | Status: AC

## 2024-10-01 NOTE — Patient Instructions (Signed)
" °  VISIT SUMMARY: Charles [name], an 84 year old male with Parkinson's disease, visited today to discuss urinary incontinence and medication management. He experiences nightly urinary incontinence and morning drowsiness, likely due to his medication regimen. His family is involved in his care and has concerns about his current medications. He is also dealing with stress due to his wife's health issues.  YOUR PLAN: -PARKINSON'S DISEASE: Parkinson's disease is a progressive nervous system disorder that affects movement. Your morning drowsiness may be related to your medication regimen. We have referred you to a neurologist to help manage your carbidopa -levodopa  dosing. We are also reducing your mirtazapine  to 7.5 mg for 1-2 weeks to see if it helps with the drowsiness. Please continue your home physical therapy exercises.  -BENIGN PROSTATIC HYPERPLASIA WITH LOWER URINARY TRACT SYMPTOMS: Benign prostatic hyperplasia is an enlarged prostate gland that can cause urinary problems. Your nocturnal urinary incontinence has improved with Flomax  and Myrbetriq , and you should continue taking these medications. We also suggest considering a hospital bed to make it easier for you to get in and out of bed.  -OSTEOPOROSIS: Osteoporosis is a condition where bones become weak and brittle. We discussed the importance of vitamin D  supplementation to help with bone health. Please continue taking vitamin D  as discussed.  -GENERAL HEALTH MAINTENANCE: We reviewed your vaccines and discussed the importance of keeping them up to date. Please ensure your flu and pneumonia vaccines are current, and consider getting the RSV vaccine if you wish.  INSTRUCTIONS: Please follow up with the neurologist for carbidopa -levodopa  dosing management. Continue taking your current medications as prescribed. Monitor the impact of the reduced mirtazapine  dose on your morning drowsiness. Keep up with your home physical therapy exercises. Ensure  your flu and pneumonia vaccines are up to date, and consider the RSV vaccine. Consider a hospital bed for easier access.    "

## 2024-10-01 NOTE — Assessment & Plan Note (Addendum)
 Chronic Parkinson's disease with medication management issues.  Daughter reports worsening symptoms of deactivation, low functional status in the morning time, but improves later in the day after receiving carbidopa .  Mirtazapine  is causing morning drowsiness. Referred to a neurologist for carbidopa -levodopa  dosing management. Reduced mirtazapine  to 7.5 mg for 1-2 weeks to assess the impact on drowsiness. Encouraged continuation of home physical therapy exercises.  Physical conditioning continues to decline.  He is having a lot of trouble getting in and out of his bed at this point.  I have ordered a hospital bed to be used at home for comfort, to reduce fall risk, and to reduce the risk of pressure injuries.  I am requesting a hospital bed due to a diagnosis of Parkinson's disease.  Patient requires frequent changes in position and because of the Parkinson's disease, the patient requires positioning of the body in ways not feasible with an ordinary bed.  I also think it is medically advisable for him to maintain the head of the bed elevated more than 30 degrees to reduce the risk of aspiration and improve his breathing.

## 2024-10-01 NOTE — Progress Notes (Addendum)
 "  Established Patient Office Visit  Patient ID: Charles [name], male    DOB: 08-01-1941  Age: 84 y.o. MRN: [id number] PCP: Jerrell Cleatus Ned, MD  Chief Complaint  Patient presents with   Medical Management of Chronic Issues    Pt is here with Daughter, Olam. Follow up on flomax .would like to discuss Mirtazapine ,  Vitamin D , vaccines, med for parkinson disease. Daughter reports pt had an exposure to someone who is positive for flu and pt is not having sx.      Subjective:     HPI  Discussed the use of AI scribe software for clinical note transcription with the patient, who gave verbal consent to proceed.  History of Present Illness Charles [name] is an 84 year old male with Parkinson's disease who presents with urinary incontinence and medication management concerns. He is accompanied by his family.  He experiences ongoing issues with urinary incontinence at night, having difficulty waking up to urinate, resulting in nightly episodes of incontinence. He uses bed pads and overnight briefs to manage the situation, which has improved from two months ago when it was more severe. He is currently taking Flomax  and Myrbetriq  for urinary symptoms, which may be helping to some extent.  He has a history of Parkinson's disease and is taking carbidopa -levodopa . He experiences significant drowsiness in the mornings, which may be related to his medication regimen. His family notes that he is not able to get out of bed quickly and requires time to position himself due to his condition. He has completed physical therapy and continues to do exercises at home with the help of caregivers.  He has been on mirtazapine  for appetite stimulation for several years, but there is concern about its contribution to his morning drowsiness. His family is cautious about discontinuing it abruptly due to his age and the potential for withdrawal symptoms. He is currently taking 15 mg of mirtazapine  at night.  His  wife has been in and out of the hospital, which has added stress to the family. He is considering adjustments to his sleeping arrangements to facilitate easier mobility in and out of bed. He is not currently taking vitamin D  supplements, although his family has started giving it to him when they are present. He also takes zinc and vitamin C  for wound healing.     Objective:     BP (!) 128/46 (BP Location: Left Arm, Patient Position: Sitting, Cuff Size: Normal)   Pulse 62   Temp 97.7 F (36.5 C) (Oral)   Ht 5' 9 (1.753 m)   Wt 158 lb (71.7 kg)   SpO2 97%   BMI 23.33 kg/m   Physical Exam  Gen: Well-appearing man, high degree of frailty Neuro: Alert, conversational, slow to answer questions, masked faces, moderate cogwheel rigidity in upper and lower extremities, hunched over wide-based gait using a walker. Heart: Regular, 3 out of 6 early systolic murmur best heard at the right upper sternal border Lungs: Unlabored, clear without crackles Ext: Warm, no edema    Assessment & Plan:   Problem List Items Addressed This Visit       High   Parkinson's disease (HCC) - Primary (Chronic)   Chronic Parkinson's disease with medication management issues.  Daughter reports worsening symptoms of deactivation, low functional status in the morning time, but improves later in the day after receiving carbidopa .  Mirtazapine  is causing morning drowsiness. Referred to a neurologist for carbidopa -levodopa  dosing management. Reduced mirtazapine  to 7.5 mg for 1-2  weeks to assess the impact on drowsiness. Encouraged continuation of home physical therapy exercises.  Physical conditioning continues to decline.  He is having a lot of trouble getting in and out of his bed at this point.  I have ordered a hospital bed to be used at home for comfort, to reduce fall risk, and to reduce the risk of pressure injuries.  I am requesting a hospital bed due to a diagnosis of Parkinson's disease.  Patient requires  frequent changes in position and because of the Parkinson's disease, the patient requires positioning of the body in ways not feasible with an ordinary bed.  I also think it is medically advisable for him to maintain the head of the bed elevated more than 30 degrees to reduce the risk of aspiration and improve his breathing.      Relevant Orders   For home use only DME Hospital bed   Ambulatory referral to Neurology     Medium    Depression, major, single episode, in partial remission (Chronic)   Chronic and stable.  Doing well on Lexapro  10 mg daily.  Has a lot of stressors at home right now.  His wife was hospitalized with a significant illness and is currently at rehab.  Has good support from family members and caregivers.  Suffers from isolation.  Will continue with medication assisted treatment for now.      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  Were on treatment now with Flomax  and recently added Myrbetriq  with some improvement in nocturia.  No adverse side effects of medication so far.  Will continue with this regimen.       Return in about 3 months (around 12/30/2024).    Cleatus Debby Specking, MD [facility]   "

## 2024-10-01 NOTE — Assessment & Plan Note (Signed)
 Lower urinary tract symptoms are likely multifactorial related to enlarged prostate and overactive bladder.  Were on treatment now with Flomax  and recently added Myrbetriq  with some improvement in nocturia.  No adverse side effects of medication so far.  Will continue with this regimen.

## 2024-10-01 NOTE — Assessment & Plan Note (Signed)
 Chronic and stable.  Doing well on Lexapro  10 mg daily.  Has a lot of stressors at home right now.  His wife was hospitalized with a significant illness and is currently at rehab.  Has good support from family members and caregivers.  Suffers from isolation.  Will continue with medication assisted treatment for now.

## 2024-10-07 ENCOUNTER — Telehealth: Payer: Self-pay

## 2024-10-07 NOTE — Telephone Encounter (Signed)
 Copied from CRM #[id number]. Topic: General - Other >> Oct 07, 2024  1:40 PM Brittany M wrote: Reason for CRM: patient daughter calling to check on order for hospital bed- asking for a nurse to contact her Olam [phone]

## 2024-10-08 NOTE — Telephone Encounter (Signed)
 Tried to call [facility] company patient was referred to. Companies number is no longer in service. Attaching Carla as a FYI in case she needs to update notes on her end.  [facility] [address] [place] [address] [phone]  Patients wife is with a different home health company. I will try to call the number Darice gave me to see if patient is under their care [phone]. [facility] does have patient under their care. I asked them if they have an order or any information on a hospital bed. And they do not have any orders for a bed. They are needing pcp to send order to DME company and then DME company will bring it to patients house.  Dr. Jerrell - I see that you ordered a hospital bed. I am sending it to [facility] medical. I will call daughter to let her know.

## 2024-10-08 NOTE — Telephone Encounter (Unsigned)
 Copied from CRM #[id number]. Topic: General - Other >> Oct 08, 2024  3:17 PM Robinson DEL wrote: Patients daughter Olam returning call to [place], agent relayed message to [place] from [place]. Olam acknowledged

## 2024-10-08 NOTE — Telephone Encounter (Signed)
 Called daughter and left a vm. Please let her know that I am sending order to [facility] [place] for hospital bed.

## 2024-10-26 ENCOUNTER — Telehealth: Payer: Self-pay

## 2024-10-26 NOTE — Telephone Encounter (Signed)
 Olam (patient's daughter) calling to check on order for hospital bed- asking for a nurse to contact her Olam [phone] She states that [facility] in [place] carries the bed needed and is requesting an order be sent to them.  She also states she has not heard from Neurology in reagrds to referral that was ordered by Dr. Jerrell and she has called the Neurology office several times and left messages. She would like someone to follow up on this and give her a call back.

## 2024-10-27 NOTE — Telephone Encounter (Signed)
 Order was sent to [facility] Fax [phone]

## 2024-10-27 NOTE — Telephone Encounter (Signed)
 Called [facility] and spoke with [place]. I told her that I faxed the order this morning. I confirmed if I sent it to the right location and she said yes. I asked when I should call back to see if they got it and she said in the afternoon. She took down my contact information to call me when she gets the order. She said that all orders go to the central location and then they send it to them. I called patients daughter to let her know. Lvmtrc. If she calls back, please let her know that the order was sent and I will call [facility] later today and check status  [facility] [address] [place], [place] [address] Phone: [phone]

## 2024-10-27 NOTE — Telephone Encounter (Signed)
 Kate called back and stated she has received the order at this time.

## 2024-10-27 NOTE — Telephone Encounter (Signed)
 Called patients daughter. I told her that I sent hospital bed order to [facility] on 10/08/24 around 2:30pm. She said she got a call from [facility] pharmacy and they no longer carry hospital beds and she has not heard from [facility]. She wants me to send the order to [facility]. I will send that order now. I also gave her the phone number for [facility] to follow up on.

## 2024-10-27 NOTE — Telephone Encounter (Signed)
 Called patient daughter and let her know that rcvd order

## 2024-10-28 ENCOUNTER — Ambulatory Visit (INDEPENDENT_AMBULATORY_CARE_PROVIDER_SITE_OTHER): Admitting: *Deleted

## 2024-10-28 VITALS — Ht 69.0 in | Wt 158.0 lb

## 2024-10-28 DIAGNOSIS — Z0000 Encounter for general adult medical examination without abnormal findings: Secondary | ICD-10-CM

## 2024-10-28 NOTE — Progress Notes (Signed)
 "  Chief Complaint  Patient presents with   Medicare Wellness     Subjective:   Charles [name] is a 84 y.o. male who presents for a Medicare Annual Wellness Visit.  Visit info / Clinical Intake: Medicare Wellness Visit Type:: Subsequent Annual Wellness Visit Persons participating in visit and providing information:: patient Medicare Wellness Visit Mode:: Telephone If telephone:: video declined Since this visit was completed virtually, some vitals may be partially provided or unavailable. Missing vitals are due to the limitations of the virtual format.: Unable to obtain vitals - no equipment If Telephone or Video please confirm:: I connected with patient using audio/video enable telemedicine. I verified patient identity with two identifiers, discussed telehealth limitations, and patient agreed to proceed. Patient Location:: home Provider Location:: home Interpreter Needed?: No Pre-visit prep was completed: no AWV questionnaire completed by patient prior to visit?: no Living arrangements:: lives with spouse/significant other Patient's Overall Health Status Rating: (!) fair Typical amount of pain: none Does pain affect daily life?: no Are you currently prescribed opioids?: no  Dietary Habits and Nutritional Risks How many meals a day?: 3 Eats fruit and vegetables daily?: yes Most meals are obtained by: preparing own meals In the last 2 weeks, have you had any of the following?: none Diabetic:: no  Functional Status Activities of Daily Living (to include ambulation/medication): (!) Needs Assist Feeding: Independent Dressing/Grooming: Independent Bathing: Independent with device- listed below Toileting: Independent Transfer: Independent with device- listed below Ambulation: Independent with device- listed below Home Assistive Devices/Equipment: Walker (specify Type) Medication Administration: Independent Home Management (perform basic housework or laundry): Needs assistance  (comment) Manage your own finances?: yes Primary transportation is: family / friends Concerns about vision?: no *vision screening is required for WTM* Concerns about hearing?: no  Fall Screening Falls in the past year?: 0 Number of falls in past year: 0 Was there an injury with Fall?: 0 Fall Risk Category Calculator: 0 Patient Fall Risk Level: Low Fall Risk  Fall Risk Patient at Risk for Falls Due to: Impaired mobility; Impaired balance/gait Fall risk Follow up: Falls evaluation completed; Education provided; Falls prevention discussed  Home and Transportation Safety: All rugs have non-skid backing?: N/A, no rugs All stairs or steps have railings?: N/A, no stairs Grab bars in the bathtub or shower?: yes Have non-skid surface in bathtub or shower?: (!) no Good home lighting?: yes Regular seat belt use?: yes Hospital stays in the last year:: no  Cognitive Assessment Difficulty concentrating, remembering, or making decisions? : no Will 6CIT or Mini Cog be Completed: yes What year is it?: 0 points What month is it?: 0 points Give patient an address phrase to remember (5 components): Its very sunny outside today in January About what time is it?: 0 points Count backwards from 20 to 1: 0 points Say the months of the year in reverse: 0 points Repeat the address phrase from earlier: 2 points 6 CIT Score: 2 points  Advance Directives (For Healthcare) Does Patient Have a Medical Advance Directive?: Yes Does patient want to make changes to medical advance directive?: No - Patient declined Type of Advance Directive: Healthcare Power of Attorney Copy of Healthcare Power of Attorney in Chart?: No - copy requested Copy of Living Will in Chart?: No - copy requested Out of facility DNR (pink MOST or yellow form) in Chart? (Ambulatory ONLY): No - copy requested  Reviewed/Updated  Reviewed/Updated: Reviewed All (Medical, Surgical, Family, Medications, Allergies, Care Teams, Patient  Goals); Surgical History; Family History; Medications; Allergies;  Care Teams; Patient Goals; Medical History    Allergies (verified) Propofol   Current Medications (verified) Outpatient Encounter Medications as of 10/28/2024  Medication Sig   carbidopa -levodopa  (SINEMET  IR) 25-100 MG tablet Take 1 tablet by mouth 3 (three) times daily. (0830, 1330, & 2030)   clobetasol (TEMOVATE) 0.05 % external solution    escitalopram  (LEXAPRO ) 10 MG tablet Take 1 tablet (10 mg total) by mouth at bedtime.   ketoconazole (NIZORAL) 2 % shampoo Apply topically.   mirabegron  ER (MYRBETRIQ ) 25 MG TB24 tablet Take 1 tablet (25 mg total) by mouth daily.   mirtazapine  (REMERON ) 15 MG tablet Take 0.5 tablets (7.5 mg total) by mouth at bedtime.   tamsulosin  (FLOMAX ) 0.4 MG CAPS capsule Take 1 capsule (0.4 mg total) by mouth daily.   triamcinolone cream (KENALOG) 0.1 % Apply topically.   No facility-administered encounter medications on file as of 10/28/2024.    History: Past Medical History:  Diagnosis Date   AC joint separation    Right shoulder   Acute pericarditis 08/09/2018   Admitted 11/19 for pericarditis; ? 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   Sick sinus syndrome (HCC) 07/14/2018   SSS (sick sinus syndrome) (HCC)    Past Surgical History:  Procedure Laterality Date   acromioclavicular separation  07/04/2016   CHEST TUBE INSERTION Left 08/05/2018   EYE SURGERY     HERNIA REPAIR     LEAD REVISION/REPAIR N/A 08/10/2018   Procedure: LEAD REVISION/REPAIR;  Surgeon: Fernande Elspeth BROCKS, MD;  [facility] INVASIVE CV LAB;  Service: Cardiovascular;  Laterality: N/A;   NM  MYOVIEW  LTD     PACEMAKER IMPLANT N/A 08/05/2018   Procedure: St Jude dual lead PACEMAKER IMPLANT;  Surgeon: Francyne Headland, MD;  Location: [facility] INVASIVE CV LAB;  Service: Cardiovascular;  Laterality: N/A;   REPLACEMENT TOTAL HIP W/  RESURFACING IMPLANTS Right 06/2023   Right ingunial hernia repair     US  ECHOCARDIOGRAPHY     Family History  Problem Relation Age of Onset   Hypertension Mother    Lung cancer Mother    Diabetes Mother    Thyroid  disease Mother    Angina Father    Heart attack Father    Heart disease Brother    Diabetes Brother    Heart attack Brother    Asthma Maternal Grandmother    Stroke Maternal Grandfather    Diabetes Paternal Grandfather    Heart Problems Brother    Dementia Neg Hx    Social History   Occupational History   Not on file  Tobacco Use   Smoking status: Never   Smokeless tobacco: Never  Vaping Use   Vaping status: Never Used  Substance and Sexual Activity   Alcohol use: No   Drug use: No   Sexual activity: Not Currently   Tobacco Counseling Counseling given: Not Answered  SDOH Screenings   Food Insecurity: No Food Insecurity (10/28/2024)  Housing: Unknown (10/28/2024)  Transportation Needs: No Transportation Needs (10/28/2024)  Utilities: Not At Risk (10/28/2024)  Depression (PHQ2-9): Low Risk (10/28/2024)  Physical Activity: Insufficiently Active (10/28/2024)  Social  Connections: Moderately Integrated (10/28/2024)  Stress: No Stress Concern Present (10/28/2024)  Tobacco Use: Low Risk (10/28/2024)  Health Literacy: Adequate Health Literacy (10/28/2024)   See flowsheets for full screening details  Depression Screen PHQ 2 & 9 Depression Scale- Over the past 2 weeks, how often have you been bothered by any of the following problems? Little interest or pleasure in doing things: 0 Feeling down, depressed, or hopeless (PHQ Adolescent also includes...irritable): 0 PHQ-2 Total Score: 0 Trouble falling or staying asleep, or sleeping too much:  0 Feeling tired or having little energy: 0 Poor appetite or overeating (PHQ Adolescent also includes...weight loss): 0 Feeling bad about yourself - or that you are a failure or have let yourself or your family down: 0 Trouble concentrating on things, such as reading the newspaper or watching television (PHQ Adolescent also includes...like school work): 0 Moving or speaking so slowly that other people could have noticed. Or the opposite - being so fidgety or restless that you have been moving around a lot more than usual: 0 Thoughts that you would be better off dead, or of hurting yourself in some way: 0 PHQ-9 Total Score: 0 If you checked off any problems, how difficult have these problems made it for you to do your work, take care of things at home, or get along with other people?: Not difficult at all     Goals Addressed             This Visit's Progress    Patient Stated       Maintain current health             Objective:    Today's Vitals   10/28/24 1534  Weight: 158 lb (71.7 kg)  Height: 5' 9 (1.753 m)   Body mass index is 23.33 kg/m.  Hearing/Vision screen Hearing Screening - Comments:: No trouble hearing  Vision Screening - Comments:: Ruthellen Jarvis of md name Up to date Immunizations and Health Maintenance Health Maintenance  Topic Date Due   Zoster Vaccines- Shingrix (1 of 2) Never done   COVID-19 Vaccine (5 - 2025-26 season) 05/31/2024   Medicare Annual Wellness (AWV)  10/28/2025   DTaP/Tdap/Td (3 - Td or Tdap) 11/06/2029   Pneumococcal Vaccine: 50+ Years  Completed   Influenza Vaccine  Completed   Meningococcal B Vaccine  Aged Out   Hepatitis C Screening  Discontinued        Assessment/Plan:  This is a routine wellness examination for Khalin.  Patient Care Team: Jerrell Cleatus Ned, MD as PCP - General (Internal Medicine) Croitoru, Jerel, MD as PCP - Cardiology (Cardiology)  I have personally reviewed and noted the following in the  patients chart:   Medical and social history Use of alcohol, tobacco or illicit drugs  Current medications and supplements including opioid prescriptions. Functional ability and status Nutritional status Physical activity Advanced directives List of other physicians Hospitalizations, surgeries, and ER visits in previous 12 months Vitals Screenings to include cognitive, depression, and falls Referrals and appointments  No orders of the defined types were placed in this encounter.  In addition, I have reviewed and discussed with patient certain preventive protocols, quality metrics, and best practice recommendations. A written personalized care plan for preventive services as well as general preventive health recommendations were provided to patient.   Mliss Graff, LPN   8/70/7973   Return in 1 year (on 10/28/2025).  After Visit Summary: (MyChart) Due to this being a telephonic visit, the after visit  summary with patients personalized plan was offered to patient via MyChart   Nurse Notes:   No voiced or noted concerns at this time  "

## 2024-10-28 NOTE — Patient Instructions (Signed)
 Charles [name],  Thank you for taking the time for your Medicare Wellness Visit. I appreciate your continued commitment to your health goals. Please review the care plan we discussed, and feel free to reach out if I can assist you further.  Please note that Annual Wellness Visits do not include a physical exam. Some assessments may be limited, especially if the visit was conducted virtually. If needed, we may recommend an in-person follow-up with your provider.  Ongoing Care Seeing your primary care provider every 3 to 6 months helps us  monitor your health and provide consistent, personalized care.   Referrals If a referral was made during today's visit and you haven't received any updates within two weeks, please contact the referred provider directly to check on the status.  Recommended Screenings:  Health Maintenance  Topic Date Due   Zoster (Shingles) Vaccine (1 of 2) Never done   COVID-19 Vaccine (5 - 2025-26 season) 05/31/2024   Medicare Annual Wellness Visit  10/28/2025   DTaP/Tdap/Td vaccine (3 - Td or Tdap) 11/06/2029   Pneumococcal Vaccine for age over 86  Completed   Flu Shot  Completed   Meningitis B Vaccine  Aged Out   Hepatitis C Screening  Discontinued       10/28/2024    3:35 PM  Advanced Directives  Does Patient Have a Medical Advance Directive? Yes  Type of Advance Directive Healthcare Power of Attorney  Copy of Healthcare Power of Attorney in Chart? No - copy requested    Vision: Annual vision screenings are recommended for early detection of glaucoma, cataracts, and diabetic retinopathy. These exams can also reveal signs of chronic conditions such as diabetes and high blood pressure.  Dental: Annual dental screenings help detect early signs of oral cancer, gum disease, and other conditions linked to overall health, including heart disease and diabetes.  Please see the attached documents for additional preventive care recommendations.           Charles [name] , Thank you for taking time to come for your Medicare Wellness Visit. I appreciate your ongoing commitment to your health goals. Please review the following plan we discussed and let me know if I can assist you in the future.   Screening recommendations/referrals: Colonoscopy:  Recommended yearly ophthalmology/optometry visit for glaucoma screening and checkup Recommended yearly dental visit for hygiene and checkup  Vaccinations: Influenza vaccine:  Pneumococcal vaccine:  Tdap vaccine:  Shingles vaccine:      Preventive Care 65 Years and Older, Male Preventive care refers to lifestyle choices and visits with your health care provider that can promote health and wellness. What does preventive care include? A yearly physical exam. This is also called an annual well check. Dental exams once or twice a year. Routine eye exams. Ask your health care provider how often you should have your eyes checked. Personal lifestyle choices, including: Daily care of your teeth and gums. Regular physical activity. Eating a healthy diet. Avoiding tobacco and drug use. Limiting alcohol use. Practicing safe sex. Taking low doses of aspirin  every day. Taking vitamin and mineral supplements as recommended by your health care provider. What happens during an annual well check? The services and screenings done by your health care provider during your annual well check will depend on your age, overall health, lifestyle risk factors, and family history of disease. Counseling  Your health care provider may ask you questions about your: Alcohol use. Tobacco use. Drug use. Emotional well-being. Home and relationship well-being. Sexual activity. Eating  habits. History of falls. Memory and ability to understand (cognition). Work and work astronomer. Screening  You may have the following tests or measurements: Height, weight, and BMI. Blood pressure. Lipid and cholesterol levels. These may be  checked every 5 years, or more frequently if you are over 27 years old. Skin check. Lung cancer screening. You may have this screening every year starting at age 24 if you have a 30-pack-year history of smoking and currently smoke or have quit within the past 15 years. Fecal occult blood test (FOBT) of the stool. You may have this test every year starting at age 20. Flexible sigmoidoscopy or colonoscopy. You may have a sigmoidoscopy every 5 years or a colonoscopy every 10 years starting at age 35. Prostate cancer screening. Recommendations will vary depending on your family history and other risks. Hepatitis C blood test. Hepatitis B blood test. Sexually transmitted disease (STD) testing. Diabetes screening. This is done by checking your blood sugar (glucose) after you have not eaten for a while (fasting). You may have this done every 1-3 years. Abdominal aortic aneurysm (AAA) screening. You may need this if you are a current or former smoker. Osteoporosis. You may be screened starting at age 47 if you are at high risk. Talk with your health care provider about your test results, treatment options, and if necessary, the need for more tests. Vaccines  Your health care provider may recommend certain vaccines, such as: Influenza vaccine. This is recommended every year. Tetanus, diphtheria, and acellular pertussis (Tdap, Td) vaccine. You may need a Td booster every 10 years. Zoster vaccine. You may need this after age 65. Pneumococcal 13-valent conjugate (PCV13) vaccine. One dose is recommended after age 60. Pneumococcal polysaccharide (PPSV23) vaccine. One dose is recommended after age 21. Talk to your health care provider about which screenings and vaccines you need and how often you need them. This information is not intended to replace advice given to you by your health care provider. Make sure you discuss any questions you have with your health care provider. Document Released: 10/13/2015  Document Revised: 06/05/2016 Document Reviewed: 07/18/2015 Elsevier Interactive Patient Education  2017 [employer].  Fall Prevention in the Home Falls can cause injuries. They can happen to people of all ages. There are many things you can do to make your home safe and to help prevent falls. What can I do on the outside of my home? Regularly fix the edges of walkways and driveways and fix any cracks. Remove anything that might make you trip as you walk through a door, such as a raised step or threshold. Trim any bushes or trees on the path to your home. Use bright outdoor lighting. Clear any walking paths of anything that might make someone trip, such as rocks or tools. Regularly check to see if handrails are loose or broken. Make sure that both sides of any steps have handrails. Any raised decks and porches should have guardrails on the edges. Have any leaves, snow, or ice cleared regularly. Use sand or salt on walking paths during winter. Clean up any spills in your garage right away. This includes oil or grease spills. What can I do in the bathroom? Use night lights. Install grab bars by the toilet and in the tub and shower. Do not use towel bars as grab bars. Use non-skid mats or decals in the tub or shower. If you need to sit down in the shower, use a plastic, non-slip stool. Keep the floor dry. Clean up  any water that spills on the floor as soon as it happens. Remove soap buildup in the tub or shower regularly. Attach bath mats securely with double-sided non-slip rug tape. Do not have throw rugs and other things on the floor that can make you trip. What can I do in the bedroom? Use night lights. Make sure that you have a light by your bed that is easy to reach. Do not use any sheets or blankets that are too big for your bed. They should not hang down onto the floor. Have a firm chair that has side arms. You can use this for support while you get dressed. Do not have throw rugs  and other things on the floor that can make you trip. What can I do in the kitchen? Clean up any spills right away. Avoid walking on wet floors. Keep items that you use a lot in easy-to-reach places. If you need to reach something above you, use a strong step stool that has a grab bar. Keep electrical cords out of the way. Do not use floor polish or wax that makes floors slippery. If you must use wax, use non-skid floor wax. Do not have throw rugs and other things on the floor that can make you trip. What can I do with my stairs? Do not leave any items on the stairs. Make sure that there are handrails on both sides of the stairs and use them. Fix handrails that are broken or loose. Make sure that handrails are as long as the stairways. Check any carpeting to make sure that it is firmly attached to the stairs. Fix any carpet that is loose or worn. Avoid having throw rugs at the top or bottom of the stairs. If you do have throw rugs, attach them to the floor with carpet tape. Make sure that you have a light switch at the top of the stairs and the bottom of the stairs. If you do not have them, ask someone to add them for you. What else can I do to help prevent falls? Wear shoes that: Do not have high heels. Have rubber bottoms. Are comfortable and fit you well. Are closed at the toe. Do not wear sandals. If you use a stepladder: Make sure that it is fully opened. Do not climb a closed stepladder. Make sure that both sides of the stepladder are locked into place. Ask someone to hold it for you, if possible. Clearly mark and make sure that you can see: Any grab bars or handrails. First and last steps. Where the edge of each step is. Use tools that help you move around (mobility aids) if they are needed. These include: Canes. Walkers. Scooters. Crutches. Turn on the lights when you go into a dark area. Replace any light bulbs as soon as they burn out. Set up your furniture so you have a  clear path. Avoid moving your furniture around. If any of your floors are uneven, fix them. If there are any pets around you, be aware of where they are. Review your medicines with your doctor. Some medicines can make you feel dizzy. This can increase your chance of falling. Ask your doctor what other things that you can do to help prevent falls. This information is not intended to replace advice given to you by your health care provider. Make sure you discuss any questions you have with your health care provider. Document Released: 07/13/2009 Document Revised: 02/22/2016 Document Reviewed: 10/21/2014 Elsevier Interactive Patient Education  2017  [employer].

## 2024-10-29 NOTE — Addendum Note (Signed)
 Addended by: JERRELL SOLIAN T on: 10/29/2024 04:22 PM   Modules accepted: Level of Service

## 2024-10-29 NOTE — Telephone Encounter (Signed)
 Printed paperwork and placed on providers desk for review

## 2024-10-29 NOTE — Telephone Encounter (Signed)
 Thank you.  I have addended my office visit note from 1/2 with clarification about the need for the hospital bed and the medical necessity. Please fax back that office visit to the number given on this letter.

## 2024-11-02 ENCOUNTER — Ambulatory Visit: Payer: Medicare Other

## 2024-11-03 LAB — CUP PACEART REMOTE DEVICE CHECK
Battery Remaining Longevity: 57 mo
Battery Remaining Percentage: 46 %
Battery Voltage: 2.98 V
Brady Statistic AP VP Percent: 1 %
Brady Statistic AP VS Percent: 70 %
Brady Statistic AS VP Percent: 1 %
Brady Statistic AS VS Percent: 30 %
Brady Statistic RA Percent Paced: 69 %
Brady Statistic RV Percent Paced: 1 %
Date Time Interrogation Session: 20260203020014
Implantable Lead Connection Status: 753985
Implantable Lead Connection Status: 753985
Implantable Lead Implant Date: 20191106
Implantable Lead Implant Date: 20191111
Implantable Lead Location: 753859
Implantable Lead Location: 753860
Implantable Lead Model: 1948
Implantable Pulse Generator Implant Date: 20191106
Lead Channel Impedance Value: 410 Ohm
Lead Channel Impedance Value: 560 Ohm
Lead Channel Pacing Threshold Amplitude: 0.5 V
Lead Channel Pacing Threshold Amplitude: 0.875 V
Lead Channel Pacing Threshold Pulse Width: 0.5 ms
Lead Channel Pacing Threshold Pulse Width: 0.5 ms
Lead Channel Sensing Intrinsic Amplitude: 1.2 mV
Lead Channel Sensing Intrinsic Amplitude: 3.6 mV
Lead Channel Setting Pacing Amplitude: 1.125
Lead Channel Setting Pacing Amplitude: 1.5 V
Lead Channel Setting Pacing Pulse Width: 0.5 ms
Lead Channel Setting Sensing Sensitivity: 0.5 mV
Pulse Gen Model: 2272
Pulse Gen Serial Number: 9083517

## 2024-11-04 ENCOUNTER — Ambulatory Visit: Payer: Self-pay | Admitting: Cardiovascular Disease

## 2025-01-07 ENCOUNTER — Ambulatory Visit: Admitting: Student in an Organized Health Care Education/Training Program

## 2025-02-01 ENCOUNTER — Ambulatory Visit

## 2025-05-03 ENCOUNTER — Ambulatory Visit

## 2025-08-02 ENCOUNTER — Ambulatory Visit

## 2025-11-01 ENCOUNTER — Ambulatory Visit

## 2026-01-31 ENCOUNTER — Ambulatory Visit
